# Patient Record
Sex: Female | Born: 1955
Health system: Southern US, Community
[De-identification: ages and names within clinical notes are randomized; demographics above are authoritative.]

## PROBLEM LIST (undated history)

## (undated) DIAGNOSIS — F32A Depression, unspecified: Secondary | ICD-10-CM

## (undated) DIAGNOSIS — F329 Major depressive disorder, single episode, unspecified: Secondary | ICD-10-CM

## (undated) DIAGNOSIS — F419 Anxiety disorder, unspecified: Secondary | ICD-10-CM

## (undated) DIAGNOSIS — I10 Essential (primary) hypertension: Principal | ICD-10-CM

## (undated) DIAGNOSIS — K759 Inflammatory liver disease, unspecified: Secondary | ICD-10-CM

## (undated) DIAGNOSIS — G500 Trigeminal neuralgia: Secondary | ICD-10-CM

## (undated) DIAGNOSIS — G0490 Encephalitis and encephalomyelitis, unspecified: Secondary | ICD-10-CM

## (undated) DIAGNOSIS — G43909 Migraine, unspecified, not intractable, without status migrainosus: Secondary | ICD-10-CM

## (undated) HISTORY — PX: CRANIOTOMY: SHX93

## (undated) HISTORY — DX: Essential (primary) hypertension: I10

## (undated) HISTORY — DX: Trigeminal neuralgia: G50.0

## (undated) HISTORY — PX: BREAST SURGERY: SHX581

## (undated) HISTORY — PX: OTHER SURGICAL HISTORY: SHX169

---

## 1998-08-27 ENCOUNTER — Ambulatory Visit (HOSPITAL_COMMUNITY): Admission: RE | Admit: 1998-08-27 | Discharge: 1998-08-27 | Payer: Self-pay | Admitting: Obstetrics and Gynecology

## 2000-06-19 ENCOUNTER — Emergency Department (HOSPITAL_COMMUNITY): Admission: EM | Admit: 2000-06-19 | Discharge: 2000-06-19 | Payer: Self-pay | Admitting: *Deleted

## 2002-02-22 ENCOUNTER — Encounter: Admission: RE | Admit: 2002-02-22 | Discharge: 2002-03-31 | Payer: Self-pay | Admitting: Occupational Medicine

## 2002-03-18 ENCOUNTER — Inpatient Hospital Stay (HOSPITAL_COMMUNITY): Admission: EM | Admit: 2002-03-18 | Discharge: 2002-03-19 | Payer: Self-pay | Admitting: Emergency Medicine

## 2002-11-19 ENCOUNTER — Ambulatory Visit (HOSPITAL_COMMUNITY): Admission: RE | Admit: 2002-11-19 | Discharge: 2002-11-19 | Payer: Self-pay | Admitting: Neurology

## 2002-11-19 ENCOUNTER — Encounter: Payer: Self-pay | Admitting: Neurology

## 2002-11-29 ENCOUNTER — Other Ambulatory Visit: Admission: RE | Admit: 2002-11-29 | Discharge: 2002-11-29 | Payer: Self-pay | Admitting: Obstetrics and Gynecology

## 2002-12-12 ENCOUNTER — Encounter: Payer: Self-pay | Admitting: Obstetrics and Gynecology

## 2002-12-12 ENCOUNTER — Encounter: Admission: RE | Admit: 2002-12-12 | Discharge: 2002-12-12 | Payer: Self-pay | Admitting: Obstetrics and Gynecology

## 2004-01-29 ENCOUNTER — Other Ambulatory Visit: Admission: RE | Admit: 2004-01-29 | Discharge: 2004-01-29 | Payer: Self-pay | Admitting: Obstetrics and Gynecology

## 2005-04-06 ENCOUNTER — Emergency Department (HOSPITAL_COMMUNITY): Admission: EM | Admit: 2005-04-06 | Discharge: 2005-04-06 | Payer: Self-pay | Admitting: Emergency Medicine

## 2005-09-24 ENCOUNTER — Other Ambulatory Visit: Admission: RE | Admit: 2005-09-24 | Discharge: 2005-09-24 | Payer: Self-pay | Admitting: Obstetrics and Gynecology

## 2006-06-03 ENCOUNTER — Encounter: Admission: RE | Admit: 2006-06-03 | Discharge: 2006-06-03 | Payer: Self-pay | Admitting: Neurology

## 2010-11-30 ENCOUNTER — Encounter: Payer: Self-pay | Admitting: Neurology

## 2011-11-10 HISTORY — PX: BRAIN SURGERY: SHX531

## 2011-11-12 DIAGNOSIS — F419 Anxiety disorder, unspecified: Secondary | ICD-10-CM | POA: Diagnosis present

## 2011-11-12 DIAGNOSIS — F32A Depression, unspecified: Secondary | ICD-10-CM | POA: Diagnosis present

## 2012-12-29 ENCOUNTER — Other Ambulatory Visit: Payer: Self-pay | Admitting: Obstetrics and Gynecology

## 2012-12-29 DIAGNOSIS — Z1231 Encounter for screening mammogram for malignant neoplasm of breast: Secondary | ICD-10-CM

## 2013-01-10 ENCOUNTER — Ambulatory Visit: Payer: Self-pay

## 2013-02-16 ENCOUNTER — Ambulatory Visit
Admission: RE | Admit: 2013-02-16 | Discharge: 2013-02-16 | Disposition: A | Discharge status: Home / Self Care | Payer: Self-pay | Source: Ambulatory Visit | Attending: Obstetrics and Gynecology | Admitting: Obstetrics and Gynecology

## 2013-02-16 DIAGNOSIS — Z1231 Encounter for screening mammogram for malignant neoplasm of breast: Secondary | ICD-10-CM

## 2013-06-15 ENCOUNTER — Other Ambulatory Visit: Payer: Self-pay | Admitting: Gastroenterology

## 2013-07-13 ENCOUNTER — Encounter (HOSPITAL_COMMUNITY): Payer: Self-pay | Admitting: Pharmacy Technician

## 2013-07-31 ENCOUNTER — Encounter (HOSPITAL_COMMUNITY): Payer: Self-pay | Admitting: *Deleted

## 2013-08-01 ENCOUNTER — Encounter (HOSPITAL_COMMUNITY): Payer: Self-pay | Admitting: Anesthesiology

## 2013-08-01 ENCOUNTER — Ambulatory Visit (HOSPITAL_COMMUNITY): Payer: 59 | Admitting: Anesthesiology

## 2013-08-01 ENCOUNTER — Ambulatory Visit (HOSPITAL_COMMUNITY)
Admission: RE | Admit: 2013-08-01 | Discharge: 2013-08-01 | Disposition: A | Discharge status: Home / Self Care | Payer: 59 | Source: Ambulatory Visit | Attending: Gastroenterology | Admitting: Gastroenterology

## 2013-08-01 ENCOUNTER — Encounter (HOSPITAL_COMMUNITY)
Admission: RE | Disposition: A | Discharge status: Home / Self Care | Payer: Self-pay | Source: Ambulatory Visit | Attending: Gastroenterology

## 2013-08-01 DIAGNOSIS — F411 Generalized anxiety disorder: Secondary | ICD-10-CM | POA: Insufficient documentation

## 2013-08-01 DIAGNOSIS — D126 Benign neoplasm of colon, unspecified: Secondary | ICD-10-CM | POA: Insufficient documentation

## 2013-08-01 DIAGNOSIS — G4700 Insomnia, unspecified: Secondary | ICD-10-CM | POA: Insufficient documentation

## 2013-08-01 DIAGNOSIS — I10 Essential (primary) hypertension: Secondary | ICD-10-CM | POA: Insufficient documentation

## 2013-08-01 DIAGNOSIS — Z1211 Encounter for screening for malignant neoplasm of colon: Secondary | ICD-10-CM | POA: Insufficient documentation

## 2013-08-01 HISTORY — PX: COLONOSCOPY WITH PROPOFOL: SHX5780

## 2013-08-01 HISTORY — DX: Major depressive disorder, single episode, unspecified: F32.9

## 2013-08-01 HISTORY — DX: Depression, unspecified: F32.A

## 2013-08-01 HISTORY — DX: Inflammatory liver disease, unspecified: K75.9

## 2013-08-01 HISTORY — DX: Anxiety disorder, unspecified: F41.9

## 2013-08-01 HISTORY — DX: Essential (primary) hypertension: I10

## 2013-08-01 SURGERY — COLONOSCOPY WITH PROPOFOL
Anesthesia: Monitor Anesthesia Care

## 2013-08-01 MED ORDER — PROPOFOL 10 MG/ML IV BOLUS
INTRAVENOUS | Status: DC | PRN
  Administered 2013-08-01 (×3): 50 mg via INTRAVENOUS
  Administered 2013-08-01 (×2): 25 mg via INTRAVENOUS

## 2013-08-01 MED ORDER — SODIUM CHLORIDE 0.9 % IV SOLN: INTRAVENOUS | Status: DC

## 2013-08-01 MED ORDER — LACTATED RINGERS IV SOLN
INTRAVENOUS | Status: DC | PRN
  Administered 2013-08-01: 10:00:00 via INTRAVENOUS

## 2013-08-01 MED ORDER — LACTATED RINGERS IV SOLN
INTRAVENOUS | Status: DC
  Administered 2013-08-01: 1000 mL/h via INTRAVENOUS

## 2013-08-01 MED ORDER — KETAMINE HCL 50 MG/ML IJ SOLN
INTRAMUSCULAR | Status: DC | PRN
  Administered 2013-08-01: 25 mg via INTRAMUSCULAR

## 2013-08-01 SURGICAL SUPPLY — 22 items

## 2013-08-01 NOTE — H&P (Signed)
  Procedure: Baseline screening colonoscopy  History: The patient is a 57 year old female born 11/09/1956. The patient is scheduled to undergo her first screening colonoscopy with polypectomy to prevent colon cancer.  Medication allergies: ACE inhibitors cause cough  Past medical history: Hypertension. Trigeminal neuralgia. Anxiety. Insomnia. Gamma knife surgery in September 2007. Cardioversion for tachycardia. Decompression of trigeminal nerve in 2013.  Exam: The patient is alert and lying comfortably on the endoscopy stretcher. Abdomen is soft nontender to palpation. Lungs are clear to auscultation. Cardiac exam reveals a regular rhythm.  Plan: Proceed with a saline screening colonoscopy.

## 2013-08-01 NOTE — Op Note (Signed)
Procedure: Baseline screening colonoscopy  Endoscopist: Danise Edge  Premedication: Propofol administered by anesthesia  Procedure: The patient was placed in the left lateral decubitus position. Anal inspection and digital rectal exam were normal. The Pentax pediatric colonoscope was introduced into the rectum and advanced to the cecum. A normal-appearing ileocecal valve and appendiceal orifice were identified. Colonic preparation for the exam today was good.  Rectum. Normal. Retroflexed view of the distal rectum normal.  Sigmoid colon. From the distal sigmoid colon a 5 mm sessile polyp was removed with the cold snare and submitted for pathological interpretation.  Descending colon. Normal.  Splenic flexure. Normal.  Transverse colon. Normal.  Hepatic flexure. Normal.  Ascending colon. Normal.  Cecum and ileocecal valve. Normal.  Assessment:  #1. From the distal sigmoid colon a 5 mm sessile polyp was removed with the cold snare  #2. Otherwise normal screening proctocolonoscopy to the cecum  Recommendations: If the sigmoid colon polyp returns adenomatous pathologically, the patient should undergo a surveillance colonoscopy in 5 years. If the sigmoid colon polyp returns non-neoplastic, the patient should undergo a repeat screening colonoscopy in 10 years.

## 2013-08-01 NOTE — Anesthesia Postprocedure Evaluation (Signed)
  Anesthesia Post-op Note  Patient: Rhonda [name]  Procedure(s) Performed: Procedure(s) (LRB): COLONOSCOPY WITH PROPOFOL (N/A)  Patient Location: PACU  Anesthesia Type: MAC  Level of Consciousness: awake and alert   Airway and Oxygen Therapy: Patient Spontanous Breathing  Post-op Pain: mild  Post-op Assessment: Post-op Vital signs reviewed, Patient's Cardiovascular Status Stable, Respiratory Function Stable, Patent Airway and No signs of Nausea or vomiting  Last Vitals:  Filed Vitals:   08/01/13 1110  BP: 150/97  Pulse:   Temp:   Resp: 11    Post-op Vital Signs: stable   Complications: No apparent anesthesia complications

## 2013-08-01 NOTE — Anesthesia Preprocedure Evaluation (Addendum)
Anesthesia Evaluation  Patient identified by MRN, date of birth, ID band Patient awake    Reviewed: Allergy & Precautions, H&P , NPO status , Patient's Chart, lab work & pertinent test results, reviewed documented beta blocker date and time   Airway Mallampati: II TM Distance: >3 FB Neck ROM: full    Dental no notable dental hx. (+) Teeth Intact and Dental Advisory Given   Pulmonary neg pulmonary ROS,  breath sounds clear to auscultation  Pulmonary exam normal       Cardiovascular Exercise Tolerance: Good hypertension, Pt. on medications and Pt. on home beta blockers negative cardio ROS  Rhythm:regular Rate:Normal     Neuro/Psych negative neurological ROS  negative psych ROS   GI/Hepatic negative GI ROS, Neg liver ROS, (+) Hepatitis -, BAge 15 hep B   Endo/Other  negative endocrine ROS  Renal/GU negative Renal ROS  negative genitourinary   Musculoskeletal   Abdominal   Peds  Hematology negative hematology ROS (+)   Anesthesia Other Findings   Reproductive/Obstetrics negative OB ROS                          Anesthesia Physical Anesthesia Plan  ASA: II  Anesthesia Plan: MAC   Post-op Pain Management:    Induction:   Airway Management Planned: Simple Face Mask  Additional Equipment:   Intra-op Plan:   Post-operative Plan:   Informed Consent: I have reviewed the patients History and Physical, chart, labs and discussed the procedure including the risks, benefits and alternatives for the proposed anesthesia with the patient or authorized representative who has indicated his/her understanding and acceptance.   Dental Advisory Given  Plan Discussed with: CRNA and Surgeon  Anesthesia Plan Comments:         Anesthesia Quick Evaluation

## 2013-08-01 NOTE — Transfer of Care (Signed)
Immediate Anesthesia Transfer of Care Note  Patient: Rhonda [name]  Procedure(s) Performed: Procedure(s): COLONOSCOPY WITH PROPOFOL (N/A)  Patient Location: PACU  Anesthesia Type:MAC  Level of Consciousness: awake, alert , sedated and patient cooperative  Airway & Oxygen Therapy: Patient Spontanous Breathing and Patient connected to face mask oxygen  Post-op Assessment: Report given to PACU RN and Post -op Vital signs reviewed and stable  Post vital signs: Reviewed and stable  Complications: No apparent anesthesia complications

## 2013-08-02 ENCOUNTER — Encounter (HOSPITAL_COMMUNITY): Payer: Self-pay | Admitting: Gastroenterology

## 2013-08-11 ENCOUNTER — Encounter: Payer: Self-pay | Admitting: Family Medicine

## 2013-08-11 ENCOUNTER — Ambulatory Visit (INDEPENDENT_AMBULATORY_CARE_PROVIDER_SITE_OTHER): Payer: 59 | Admitting: Family Medicine

## 2013-08-11 VITALS — BP 150/92 | HR 70

## 2013-08-11 DIAGNOSIS — M7710 Lateral epicondylitis, unspecified elbow: Secondary | ICD-10-CM

## 2013-08-11 DIAGNOSIS — M7711 Lateral epicondylitis, right elbow: Secondary | ICD-10-CM | POA: Insufficient documentation

## 2013-08-11 MED ORDER — MELOXICAM 15 MG PO TABS: 15.0000 mg | ORAL_TABLET | Freq: Every day | ORAL | Status: DC

## 2013-08-11 MED ORDER — NITROGLYCERIN 0.2 MG/HR TD PT24: MEDICATED_PATCH | TRANSDERMAL | Status: DC

## 2013-08-11 NOTE — Assessment & Plan Note (Addendum)
Discussed with patient at great length about prognosis, and rehabilitation.  Patient given prescription for meloxicam as well as nitroglycerin protocol which the chronicity of this problem. Patient showed that she did have non-insertional chronic tendinopathy and I do think will respond well to nitroglycerin. Discussed icing protocol Home exercise program given. Patient does have a home TENS unit and can use this as tolerated. Patient given a wrist brace to wear at night and elbow compression brace to wear during the day. Patient will come back and see me again in 3-4 weeks for further evaluation.

## 2013-08-11 NOTE — Progress Notes (Signed)
CC: Elbow pain, right  HPI: Patient is a very pleasant 57 year old female who works as a Engineer, civil (consulting) within the [employer] coming in with elbow pain. Patient states that she has had this pain for approximately 2 years intermittently. Patient does not remember any specific injury. Patient describes the pain as a dull chronic sensation. Patient states that certain activity seems to make it worse. Patient states they can have sharp pain with certain range of motion exercises. Patient states throwing a Frisbee or sometimes at work when moving the patient she can have pain. Patient denies any radiation, any numbness. Patient has been seen an orthopedic surgeon for this over the course of the last 2 years and has had multiple corticosteroid injections in it. Patient has not been formal physical therapy and did not respond well to the counter brace. Patient continues to try a topical anti-inflammatories as well as icing with minimal improvement. Patient also takes over-the-counter anti-inflammatories with minimal improvement. Patient was the severity of pain approximately 3-4/10 but states that the affecting of her daily life seems to be 7/10.  Past medical, surgical, family and social history reviewed. Medications reviewed all in the electronic medical record.   Review of Systems: No headache, visual changes, nausea, vomiting, diarrhea, constipation, dizziness, abdominal pain, skin rash, fevers, chills, night sweats, weight loss, swollen lymph nodes, body aches, joint swelling, muscle aches, chest pain, shortness of breath, mood changes.   Objective:    Blood pressure 150/92, pulse 70, weight 0 lb (0 kg), SpO2 97.00%.   General: No apparent distress alert and oriented x3 mood and affect normal, dressed appropriately.  HEENT: Pupils equal, extraocular movements intact Respiratory: Patient's speak in full sentences and does not appear short of breath Cardiovascular: No lower extremity edema, non  tender, no erythema Skin: Warm dry intact with no signs of infection or rash on extremities or on axial skeleton. Abdomen: Soft nontender Neuro: Cranial nerves II through XII are intact, neurovascularly intact in all extremities with 2+ DTRs and 2+ pulses. Lymph: No lymphadenopathy of posterior or anterior cervical chain or axillae bilaterally.  Gait normal with good balance and coordination.  MSK: Non tender with full range of motion and good stability and symmetric strength and tone of shoulders, wrist, hip, knee and ankles bilaterally.  Elbow: Right Unremarkable to inspection. Range of motion full pronation, supination, flexion, extension. Strength is full to all of the above directions.  Patient though does have significant pain against resisted extension of the middle finger at the lateral epicondylar region. Stable to varus, valgus stress. Negative moving valgus stress test.. Ulnar nerve does not sublux. Negative cubital tunnel Tinel's. Left elbow is unremarkable.  Musculoskeletal ultrasound was performed and interpreted by Terrilee Files D.O.   Elbow:  Lateral epicondyle and common extensor tendon origin visualized.  Trace effusion noted. Patient has what appears to be chronic tendinopathy with calcific changes within the tendon itself likely representing a remote tear.  Radial head unremarkable and located in annular ligament Medial epicondyle and common flexor tendon origin visualized.  No edema, effusions, or avulsions seen. Ulnar nerve in cubital tunnel unremarkable. Olecranon and triceps insertion visualized and unremarkable without edema, effusion, or avulsion.  No signs olecranon bursitis. Power doppler signal normal.  IMPRESSION:  Chronic non-insertional tendinopathy of a lateral epicondylitis   Impression and Recommendations:     This case required medical decision making of moderate complexity.

## 2013-08-11 NOTE — Patient Instructions (Signed)
Always good to see you You do have chronic tenopathy of the lateral epicondylitis.  meloxicam daily for 10 days then as needed.  Nitroglycerin Protocol   Apply 1/4 nitroglycerin patch to affected area daily.  Change position of patch within the affected area every 24 hours.  You may experience a headache during the first 1-2 weeks of using the patch, these should subside.  If you experience headaches after beginning nitroglycerin patch treatment, you may take your preferred over the counter pain reliever.  Another side effect of the nitroglycerin patch is skin irritation or rash related to patch adhesive.  Please notify our office if you develop more severe headaches or rash, and stop the patch.  Tendon healing with nitroglycerin patch may require 12 to 24 weeks depending on the extent of injury.  Men should not use if taking Viagra, Cialis, or Levitra.   Do not use if you have migraines or rosacea.   Icing 20 minutes 2 times daily Wear compression with work and brace at night.  Come back in 3-4 weeks.

## 2013-08-11 NOTE — Addendum Note (Signed)
Addended by: Judi Saa on: 08/11/2013 12:08 PM   Modules accepted: Orders

## 2013-09-07 ENCOUNTER — Ambulatory Visit: Payer: 59 | Admitting: Family Medicine

## 2013-09-28 ENCOUNTER — Ambulatory Visit (INDEPENDENT_AMBULATORY_CARE_PROVIDER_SITE_OTHER): Payer: 59 | Admitting: Cardiology

## 2013-09-28 ENCOUNTER — Encounter: Payer: Self-pay | Admitting: Cardiology

## 2013-09-28 VITALS — BP 160/90 | HR 66 | Ht 62.5 in | Wt 200.0 lb

## 2013-09-28 DIAGNOSIS — I10 Essential (primary) hypertension: Secondary | ICD-10-CM | POA: Insufficient documentation

## 2013-09-28 DIAGNOSIS — R7309 Other abnormal glucose: Secondary | ICD-10-CM

## 2013-09-28 DIAGNOSIS — R0789 Other chest pain: Secondary | ICD-10-CM

## 2013-09-28 DIAGNOSIS — R739 Hyperglycemia, unspecified: Secondary | ICD-10-CM

## 2013-09-28 HISTORY — DX: Essential (primary) hypertension: I10

## 2013-09-28 MED ORDER — HYDROCHLOROTHIAZIDE 25 MG PO TABS: 25.0000 mg | ORAL_TABLET | Freq: Every day | ORAL | Status: AC

## 2013-09-28 NOTE — Progress Notes (Signed)
Rhonda [name] Date of Birth: 04/22/1956 Medical Record #[id number]  History of Present Illness: Rhonda [name] is seen today for evaluation of hypertension. I've seen her in the past for evaluation of the same. She reports that her blood pressure has not been under ideal control. She has been taking losartan and metoprolol. She is intolerant to lisinopril because of cough. She developed swelling on amlodipine. She does have some increased stressors at home. She also is being treated for trigeminal neuralgia. She was previously on HCTZ but this was discontinued by herself. There is no history of intolerance to this. She does report that when her blood pressure is elevated she notices throbbing in her head. She has been experiencing symptoms of intermittent chest tightness. It is interesting to note that she was placed on a nitroglycerin patch for tennis elbow and after the nitroglycerin patch was placed her chest tightness resolved. She reports that she thinks is most of her meals at home and does not add salt. She doesn't eat out a lot or eat any fast food. She does not exercise regularly. She has been battling her weight.  Current Outpatient Prescriptions on File Prior to Visit  Medication Sig Dispense Refill  . ALPRAZolam (XANAX) 0.25 MG tablet Take 0.25 mg by mouth at bedtime as needed for sleep.      Marland Kitchen aspirin EC 81 MG tablet Take 81 mg by mouth daily.      . baclofen (LIORESAL) 10 MG tablet Take 10 mg by mouth 3 (three) times daily.      . fish oil-omega-3 fatty acids 1000 MG capsule Take 2 g by mouth daily.      Marland Kitchen gabapentin (NEURONTIN) 100 MG capsule Take 100 mg by mouth 2 (two) times daily as needed (pain).      . Glucosamine 500 MG CAPS Take 1 capsule by mouth daily.      . meloxicam (MOBIC) 15 MG tablet Take 1 tablet (15 mg total) by mouth daily.  30 tablet  0  . metoprolol succinate (TOPROL-XL) 100 MG 24 hr tablet Take 100 mg by mouth every evening. Take with or immediately following a meal.        . Multiple Vitamin (MULTIVITAMIN WITH MINERALS) TABS tablet Take 1 tablet by mouth daily.      . nitroGLYCERIN (NITRODUR - DOSED IN MG/24 HR) 0.2 mg/hr patch 1/4 patch daily  30 patch  1  . venlafaxine XR (EFFEXOR-XR) 75 MG 24 hr capsule Take 75 mg by mouth every morning.      . vitamin E 400 UNIT capsule Take 400 Units by mouth daily.       No current facility-administered medications on file prior to visit.    Allergies  Allergen Reactions  . Amlodipine Swelling  . Lisinopril Cough    Past Medical History  Diagnosis Date  . Hypertension   . Anxiety   . Depression   . Hepatitis     ? B when age 25  . Trigeminal neuralgia   . HTN (hypertension) 09/28/2013    Past Surgical History  Procedure Laterality Date  . Breast surgery      age 27-benign,  . Brain surgery  2013    trigeminal neuralgia  . Colonoscopy with propofol N/A 08/01/2013    Procedure: COLONOSCOPY WITH PROPOFOL;  Surgeon: Charolett Bumpers, MD;  Location: [facility];  Service: Endoscopy;  Laterality: N/A;  . Craniotomy      History  Smoking status  . Never Smoker  Smokeless tobacco  . Not on file    History  Alcohol Use  . Yes    Comment: occassionally wine    Family History  Problem Relation Age of Onset  . Arthritis Mother   . Hyperlipidemia Mother   . Heart disease Mother   . Stroke Mother   . Hypertension Mother   . Diabetes Mother   . Arthritis Father   . Hyperlipidemia Father   . Heart disease Father   . Stroke Father   . Hypertension Father   . Diabetes Father   . Cancer Maternal Grandmother   . Kidney disease Maternal Grandmother   . Mental illness Maternal Grandmother   . Hypertension Maternal Grandmother   . Heart disease Maternal Grandmother   . Cancer Maternal Grandfather   . Kidney disease Maternal Grandfather   . Mental illness Maternal Grandfather   . Hypertension Maternal Grandfather   . Heart disease Maternal Grandfather   . Cancer Paternal Grandmother   . Kidney  disease Paternal Grandmother   . Mental illness Paternal Grandmother   . Hypertension Paternal Grandmother   . Heart disease Paternal Grandmother   . Cancer Paternal Grandfather   . Kidney disease Paternal Grandfather   . Mental illness Paternal Grandfather   . Hypertension Paternal Grandfather   . Heart disease Paternal Grandfather     Review of Systems: The review of systems is positive for trigeminal neuralgia.  She underwent a craniotomy procedure for this. All other systems were reviewed and are negative.  Physical Exam: BP 160/90  Pulse 66  Ht 5' 2.5" (1.588 m)  Wt 200 lb (90.719 kg)  BMI 35.97 kg/m2 She is a pleasant, overweight white female in no acute distress. HEENT: Normocephalic, atraumatic. Pupils equal round and reactive. Sclera clear. Oropharynx is clear. Neck: No JVD or bruits. No adenopathy or thyromegaly. Lungs: Clear Cardiovascular: Regular rate and rhythm, normal S1 and S2, no gallop or murmur. Abdomen: Obese, soft, nontender. No masses or bruits. No hepatosplenomegaly. Bowel sounds are positive. Extremities: No cyanosis or edema. Pedal pulses are 2+. Skin: Warm and dry Neuro: Alert and oriented x3. Cranial nerves II through XII are intact.    LABORATORY DATA: Records obtained from her primary care office demonstrated a normal urinalysis. In April 2014 dose was 135, BUN 16, creatinine 0.81. Electrolytes were normal. TSH was normal 1.82.  ECG today demonstrates normal sinus rhythm. There is T wave abnormality consistent with inferior lateral ischemia.  Assessment / Plan: 1. Chest tightness concerning for angina. Symptoms improved with the nitroglycerin patch. ECG shows evidence of inferior lateral ischemia. Patient has never had ischemic evaluation. We will schedule her for a stress Myoview study.  2. Hypertension. Blood pressures have been consistently mildly elevated. This may be exacerbated by social stressors and her trigeminal neuralgia. I think her  current therapy is acceptable but would recommend adding HCTZ 25 mg daily. We discussed the importance of sodium restriction and a heart healthy diet. With her elevated blood sugar she needs to be particularly cautious with simple carbohydrates. I recommended aerobic exercise 30-40 minutes a day. I will followup in 2 months and we will check fasting lab work at that time including chemistries, lipids, and A1c.  3. Intolerance to amlodipine and ACE inhibitors.  4. Trigeminal neuralgia.

## 2013-09-28 NOTE — Patient Instructions (Signed)
Restrict your sodium intake and try and lose weight  Start HCTZ 25 mg daily.  Continue your other medication  Try and get 30-45 minutes of aerobic exercise daily.  We will schedule you for a nuclear stress test.  I will see you in 2 months with fasting labs.

## 2013-11-07 ENCOUNTER — Ambulatory Visit: Payer: 59 | Admitting: Family Medicine

## 2013-11-20 ENCOUNTER — Encounter: Payer: Self-pay | Admitting: Cardiology

## 2013-11-20 ENCOUNTER — Encounter (INDEPENDENT_AMBULATORY_CARE_PROVIDER_SITE_OTHER): Payer: Self-pay

## 2013-11-20 ENCOUNTER — Ambulatory Visit (HOSPITAL_COMMUNITY): Payer: 59 | Attending: Cardiology | Admitting: Radiology

## 2013-11-20 VITALS — BP 157/97 | HR 71 | Ht 62.5 in | Wt 201.0 lb

## 2013-11-20 DIAGNOSIS — R0609 Other forms of dyspnea: Secondary | ICD-10-CM | POA: Insufficient documentation

## 2013-11-20 DIAGNOSIS — Z8249 Family history of ischemic heart disease and other diseases of the circulatory system: Secondary | ICD-10-CM | POA: Insufficient documentation

## 2013-11-20 DIAGNOSIS — R0789 Other chest pain: Secondary | ICD-10-CM | POA: Insufficient documentation

## 2013-11-20 DIAGNOSIS — I10 Essential (primary) hypertension: Secondary | ICD-10-CM | POA: Insufficient documentation

## 2013-11-20 DIAGNOSIS — R0602 Shortness of breath: Secondary | ICD-10-CM

## 2013-11-20 DIAGNOSIS — R002 Palpitations: Secondary | ICD-10-CM | POA: Insufficient documentation

## 2013-11-20 DIAGNOSIS — R0989 Other specified symptoms and signs involving the circulatory and respiratory systems: Secondary | ICD-10-CM | POA: Insufficient documentation

## 2013-11-20 DIAGNOSIS — R739 Hyperglycemia, unspecified: Secondary | ICD-10-CM

## 2013-11-20 DIAGNOSIS — R079 Chest pain, unspecified: Secondary | ICD-10-CM

## 2013-11-20 DIAGNOSIS — R9431 Abnormal electrocardiogram [ECG] [EKG]: Secondary | ICD-10-CM | POA: Insufficient documentation

## 2013-11-20 MED ORDER — TECHNETIUM TC 99M SESTAMIBI GENERIC - CARDIOLITE
30.0000 | Freq: Once | INTRAVENOUS | Status: AC | PRN
  Administered 2013-11-20: 30 via INTRAVENOUS

## 2013-11-20 MED ORDER — TECHNETIUM TC 99M SESTAMIBI GENERIC - CARDIOLITE
10.0000 | Freq: Once | INTRAVENOUS | Status: AC | PRN
  Administered 2013-11-20: 10 via INTRAVENOUS

## 2013-11-20 NOTE — Progress Notes (Signed)
[facility] [address] [place], [place] [address] [phone]    Cardiology Nuclear Med Study  Rhonda [name] is a 58 y.o. female     MRN : [id number]     DOB: [birth year]  Procedure Date: 11/20/2013  Nuclear Med Background Indication for Stress Test:  Evaluation for Ischemia and Abnormal EKG History:  No Known  prior hx of CAD; hx SVT; '03 Echo: EF 65-70% Cardiac Risk Factors: Family History - CAD and Hypertension  Symptoms: Chest Tightness without exertion (last occurrence couple months ago), DOE, Palpitations and SOB   Nuclear Pre-Procedure Caffeine/Decaff Intake:  None NPO After: 2:00am   Lungs:  clear O2 Sat: 98% on room air. IV 0.9% NS with Angio Cath:  22g  IV Site: R Hand  IV Started by:  Matilde Haymaker, RN  Chest Size (in):  42 Cup Size: DDD  Height: 5' 2.5" (1.588 m)  Weight:  201 lb (91.173 kg)  BMI:  Body mass index is 36.15 kg/(m^2). Tech Comments:  Held toprol for [facility] Med Study 1 or 2 day study: 1 day  Stress Test Type:  Stress  Reading MD: N/A  Order Authorizing Provider:  Peter Martinique, MD  Resting Radionuclide: Technetium 27m Sestamibi  Resting Radionuclide Dose: 11.0 mCi   Stress Radionuclide:  Technetium 32m Sestamibi  Stress Radionuclide Dose: 33.0 mCi           Stress Protocol Rest HR: 71 Stress HR: 155  Rest BP: 157/97 Stress BP: 222/80  Exercise Time (min): 5:00 METS: 7.0   Predicted Max HR: 161 bpm % Max HR: 96.27 bpm Rate Pressure Product: 34410   Dose of Adenosine (mg):  n/a Dose of Lexiscan: n/a mg  Dose of Atropine (mg): n/a Dose of Dobutamine: n/a mcg/kg/min (at max HR)  Stress Test Technologist: Irven Baltimore, RN  Nuclear Technologist:  Charlton Amor, CNMT     Rest Procedure:  Myocardial perfusion imaging was performed at rest 45 minutes following the intravenous administration of Technetium 38m Sestamibi. Rest ECG: NSR with non-specific ST-T wave changes  Stress Procedure:  The  patient exercised on the treadmill utilizing the Bruce Protocol for 5:00 minutes, RPE=15. The patient stopped due to DOE and denied any chest pain. There was a hypertensive response to exercise. Technetium 41m Sestamibi was injected at peak exercise and myocardial perfusion imaging was performed after a brief delay. Stress ECG: With stress, nonspecific STT wave changes increase.  QPS Raw Data Images:  Normal; no motion artifact; normal heart/lung ratio. Stress Images:  Normal homogeneous uptake in all areas of the myocardium. Rest Images:  Normal homogeneous uptake in all areas of the myocardium. Subtraction (SDS):  No evidence of ischemia. Transient Ischemic Dilatation (Normal <1.22):  0.96 Lung/Heart Ratio (Normal <0.45):  0.35  Quantitative Gated Spect Images QGS EDV:  54 ml QGS ESV:  15 ml  Impression Exercise Capacity:  Good exercise capacity. BP Response:  Hypertensive blood pressure response. Clinical Symptoms:  No chest pain. ECG Impression:  No significant ST segment change suggestive of ischemia. Comparison with Prior Nuclear Study: No previous nuclear study performed  Overall Impression:  Low risk stress nuclear study.  No evidence of ischemia or scar. Hypertensive response to exercise..  LV Ejection Fraction: 73%.  LV Wall Motion:  NL LV Function; NL Wall Motion  [employer]

## 2013-11-22 ENCOUNTER — Encounter: Payer: Self-pay | Admitting: Family Medicine

## 2013-11-22 ENCOUNTER — Other Ambulatory Visit (INDEPENDENT_AMBULATORY_CARE_PROVIDER_SITE_OTHER): Payer: 59

## 2013-11-22 ENCOUNTER — Ambulatory Visit (INDEPENDENT_AMBULATORY_CARE_PROVIDER_SITE_OTHER): Payer: 59 | Admitting: Family Medicine

## 2013-11-22 VITALS — BP 142/78 | HR 67 | Temp 97.9°F | Resp 16

## 2013-11-22 DIAGNOSIS — M7711 Lateral epicondylitis, right elbow: Secondary | ICD-10-CM

## 2013-11-22 DIAGNOSIS — M7710 Lateral epicondylitis, unspecified elbow: Secondary | ICD-10-CM

## 2013-11-22 DIAGNOSIS — S548X9A Unspecified injury of other nerves at forearm level, unspecified arm, initial encounter: Secondary | ICD-10-CM | POA: Insufficient documentation

## 2013-11-22 DIAGNOSIS — S5420XA Injury of radial nerve at forearm level, unspecified arm, initial encounter: Secondary | ICD-10-CM

## 2013-11-22 NOTE — Progress Notes (Signed)
CC: Elbow pain, right  HPI: Patient is a very pleasant 58 year old female who works as a Marine scientist within the [employer] coming in for followup of left lateral epicondylitis. Patient was seen longer than 3 months ago. Patient did have a granddaughter born and she has been out of town for quite some time. Patient states she has not been as aggressive with the exercises but continues to have a nitroglycerin patch as well as the counter brace and wrist splint at night. Patient states she has not noticed any significant improvement.  Past medical, surgical, family and social history reviewed. Medications reviewed all in the electronic medical record.   Review of Systems: No headache, visual changes, nausea, vomiting, diarrhea, constipation, dizziness, abdominal pain, skin rash, fevers, chills, night sweats, weight loss, swollen lymph nodes, body aches, joint swelling, muscle aches, chest pain, shortness of breath, mood changes.   Objective:    Blood pressure 142/78, pulse 67, temperature 97.9 F (36.6 C), temperature source Oral, resp. rate 16, SpO2 98.00%.   General: No apparent distress alert and oriented x3 mood and affect normal, dressed appropriately.  HEENT: Pupils equal, extraocular movements intact Respiratory: Patient's speak in full sentences and does not appear short of breath Cardiovascular: No lower extremity edema, non tender, no erythema Skin: Warm dry intact with no signs of infection or rash on extremities or on axial skeleton. Abdomen: Soft nontender Neuro: Cranial nerves II through XII are intact, neurovascularly intact in all extremities with 2+ DTRs and 2+ pulses. Lymph: No lymphadenopathy of posterior or anterior cervical chain or axillae bilaterally.  Gait normal with good balance and coordination.  MSK: Non tender with full range of motion and good stability and symmetric strength and tone of shoulders, wrist, hip, knee and ankles bilaterally.  Elbow:  Right Unremarkable to inspection. Range of motion full pronation, supination, flexion, extension. Strength is full to all of the above directions.  Patient though does have significant pain against resisted extension of the middle finger at the lateral epicondylar region. Patient does have some mild weakness of extension of the wrist compared to last visit. Stable to varus, valgus stress. Negative moving valgus stress test.. Ulnar nerve does not sublux. Negative cubital tunnel Tinel's. Left elbow is unremarkable.  Musculoskeletal ultrasound was performed and interpreted by Charlann Boxer D.O.   Elbow:  Lateral epicondyle and common extensor tendon origin visualized.  Fusion is extremely better than last visit the patient still has. As well as resolved calcific changes. Radial head unremarkable and located in annular ligament Medial epicondyle and common flexor tendon origin visualized.  No edema, effusions, or avulsions seen. Ulnar nerve in cubital tunnel unremarkable. Olecranon and triceps insertion visualized and unremarkable without edema, effusion, or avulsion.  No signs olecranon bursitis. Patient though does have significant swelling around the posterior interosseous nerve with some scarring. Power doppler signal normal.  IMPRESSION:  Chronic non-insertional tendinopathy of a lateral epicondylitis improving but, posterior interosseous nerve irritation  After verbal consent patient was prepped with alcohol swabs and with a 27-gauge 1-1/2 inch needle was injected with 3 cc of 0.5% Marcaine and 1 cc of Kenalog 40 mg/dL into the posterior interosseous nerve sheath. Patient tolerated the procedure very well to postinjection instructions given.  Impression and Recommendations:     This case required medical decision making of moderate complexity.

## 2013-11-22 NOTE — Assessment & Plan Note (Signed)
Patient had injection as described above. Patient did tolerate the procedure very well. Patient will avoid wearing a counterforce brace anymore. Continue to wear the wrist brace at night. Continue the nitroglycerin patch and we did increase her Neurontin short term to 300 mg at night. Patient and will followup again in 4 weeks for further evaluation. If this continues we may need to consider an MRI and/or look for more of a cervical radiculopathy which I think will be unnecessary.

## 2013-11-22 NOTE — Assessment & Plan Note (Signed)
As stated above. 

## 2013-11-22 NOTE — Patient Instructions (Signed)
About 1 day you may not like me Increase neurontin to 300mg  at night for next 2 weeks then go back down.  Continue the brace at night for wrist, no more counter-brace Could try 1/2 patch of nitro.  Come back again in 4 weeks.

## 2013-12-06 ENCOUNTER — Other Ambulatory Visit (INDEPENDENT_AMBULATORY_CARE_PROVIDER_SITE_OTHER): Payer: 59

## 2013-12-06 ENCOUNTER — Encounter: Payer: Self-pay | Admitting: Cardiology

## 2013-12-06 ENCOUNTER — Ambulatory Visit (INDEPENDENT_AMBULATORY_CARE_PROVIDER_SITE_OTHER): Payer: 59 | Admitting: Cardiology

## 2013-12-06 VITALS — BP 130/84 | HR 72 | Ht 62.5 in

## 2013-12-06 DIAGNOSIS — R0789 Other chest pain: Secondary | ICD-10-CM

## 2013-12-06 DIAGNOSIS — I10 Essential (primary) hypertension: Secondary | ICD-10-CM

## 2013-12-06 DIAGNOSIS — R7309 Other abnormal glucose: Secondary | ICD-10-CM

## 2013-12-06 DIAGNOSIS — R739 Hyperglycemia, unspecified: Secondary | ICD-10-CM

## 2013-12-06 LAB — BASIC METABOLIC PANEL
BUN: 18 mg/dL (ref 6–23)
CALCIUM: 9.1 mg/dL (ref 8.4–10.5)
CO2: 28 mEq/L (ref 19–32)
Chloride: 108 mEq/L (ref 96–112)
Creatinine, Ser: 0.8 mg/dL (ref 0.4–1.2)
GFR: 74.76 mL/min (ref >60.00)
GLUCOSE: 123 mg/dL — AB (ref 70–99)
Potassium: 4.2 mEq/L (ref 3.5–5.1)
Sodium: 140 mEq/L (ref 135–145)

## 2013-12-06 LAB — HEMOGLOBIN A1C: Hgb A1c MFr Bld: 7.3 % — ABNORMAL HIGH (ref 4.6–6.5)

## 2013-12-06 LAB — LIPID PANEL
Cholesterol: 188 mg/dL (ref 0–200)
HDL: 59.6 mg/dL (ref >39.00)
LDL CALC: 114 mg/dL — AB (ref 0–99)
Total CHOL/HDL Ratio: 3
Triglycerides: 73 mg/dL (ref 0.0–149.0)
VLDL: 14.6 mg/dL (ref 0.0–40.0)

## 2013-12-06 LAB — HEPATIC FUNCTION PANEL
ALT: 49 U/L — ABNORMAL HIGH (ref 0–35)
AST: 27 U/L (ref 0–37)
Albumin: 3.7 g/dL (ref 3.5–5.2)
Alkaline Phosphatase: 66 U/L (ref 39–117)
BILIRUBIN DIRECT: 0 mg/dL (ref 0.0–0.3)
BILIRUBIN TOTAL: 0.3 mg/dL (ref 0.3–1.2)
Total Protein: 6.8 g/dL (ref 6.0–8.3)

## 2013-12-06 NOTE — Patient Instructions (Signed)
Continue your current therapy  I will see you in 6 months.   

## 2013-12-06 NOTE — Progress Notes (Signed)
Rhonda [name] Date of Birth: 04-Dec-1955 Medical Record [id number]  History of Present Illness: Rhonda [name] is seen today for follow up of hypertension.  She is intolerant to lisinopril because of cough. She developed swelling on amlodipine. She does have some increased stressors at home. She also is being treated for trigeminal neuralgia. On her last visit we added HCTZ 25 mg daily and this has resulted in nice reduction of BP. She is tolerating this well. Plans to join the [employer] and try to lose 20 lbs. Stress myoview in November was normal. No further chest tightness.  Current Outpatient Prescriptions on File Prior to Visit  Medication Sig Dispense Refill  . ALPRAZolam (XANAX) 0.25 MG tablet Take 0.25 mg by mouth at bedtime as needed for sleep.      Marland Kitchen aspirin EC 81 MG tablet Take 81 mg by mouth daily.      . baclofen (LIORESAL) 10 MG tablet Take 10 mg by mouth 3 (three) times daily.      . fish oil-omega-3 fatty acids 1000 MG capsule Take 2 g by mouth daily.      Marland Kitchen gabapentin (NEURONTIN) 100 MG capsule Take 100 mg by mouth 2 (two) times daily as needed (pain).      . Glucosamine 500 MG CAPS Take 1 capsule by mouth daily.      . hydrochlorothiazide (HYDRODIURIL) 25 MG tablet Take 1 tablet (25 mg total) by mouth daily.  90 tablet  3  . metoprolol succinate (TOPROL-XL) 100 MG 24 hr tablet Take 100 mg by mouth every evening. Take with or immediately following a meal.      . Multiple Vitamin (MULTIVITAMIN WITH MINERALS) TABS tablet Take 1 tablet by mouth daily.      Marland Kitchen venlafaxine XR (EFFEXOR-XR) 75 MG 24 hr capsule Take 75 mg by mouth every morning.      . vitamin E 400 UNIT capsule Take 400 Units by mouth daily.       No current facility-administered medications on file prior to visit.    Allergies  Allergen Reactions  . Amlodipine Swelling  . Lisinopril Cough    Past Medical History  Diagnosis Date  . Hypertension   . Anxiety   . Depression   . Hepatitis     ? B when age 18  . Trigeminal  neuralgia   . HTN (hypertension) 09/28/2013    Past Surgical History  Procedure Laterality Date  . Breast surgery      age 61-benign,  . Brain surgery  2013    trigeminal neuralgia  . Colonoscopy with propofol N/A 08/01/2013    Procedure: COLONOSCOPY WITH PROPOFOL;  Surgeon: Garlan Fair, MD;  Location: [facility];  Service: Endoscopy;  Laterality: N/A;  . Craniotomy      History  Smoking status  . Never Smoker   Smokeless tobacco  . Not on file    History  Alcohol Use  . Yes    Comment: occassionally wine    Family History  Problem Relation Age of Onset  . Arthritis Mother   . Hyperlipidemia Mother   . Heart disease Mother   . Stroke Mother   . Hypertension Mother   . Diabetes Mother   . Arthritis Father   . Hyperlipidemia Father   . Heart disease Father   . Stroke Father   . Hypertension Father   . Diabetes Father   . Cancer Maternal Grandmother   . Kidney disease Maternal Grandmother   . Mental illness  Maternal Grandmother   . Hypertension Maternal Grandmother   . Heart disease Maternal Grandmother   . Cancer Maternal Grandfather   . Kidney disease Maternal Grandfather   . Mental illness Maternal Grandfather   . Hypertension Maternal Grandfather   . Heart disease Maternal Grandfather   . Cancer Paternal Grandmother   . Kidney disease Paternal Grandmother   . Mental illness Paternal Grandmother   . Hypertension Paternal Grandmother   . Heart disease Paternal Grandmother   . Cancer Paternal Grandfather   . Kidney disease Paternal Grandfather   . Mental illness Paternal Grandfather   . Hypertension Paternal Grandfather   . Heart disease Paternal Grandfather     Review of Systems: The review of systems is positive for trigeminal neuralgia.  She underwent a craniotomy procedure for this. All other systems were reviewed and are negative.  Physical Exam: BP 130/84  Pulse 72  Ht 5' 2.5" (1.588 m) She is a pleasant, overweight white female in no  acute distress. HEENT: Normocephalic, atraumatic. Pupils equal round and reactive. Sclera clear. Oropharynx is clear. Neck: No JVD or bruits. No adenopathy or thyromegaly. Lungs: Clear Cardiovascular: Regular rate and rhythm, normal S1 and S2, no gallop or murmur. Abdomen: Obese, soft, nontender. No masses or bruits. No hepatosplenomegaly. Bowel sounds are positive. Extremities: No cyanosis or edema. Pedal pulses are 2+. Skin: Warm and dry Neuro: Alert and oriented x3. Cranial nerves II through XII are intact.    LABORATORY DATA: Pending today.  Cardiology Nuclear Med Study  Rhonda [name] is a 58 y.o. female MRN : [id number] DOB: [birth year]  Procedure Date: 11/20/2013  Nuclear Med Background  Indication for Stress Test: Evaluation for Ischemia and Abnormal EKG  History: No Known prior hx of CAD; hx SVT; '03 Echo: EF 65-70%  Cardiac Risk Factors: Family History - CAD and Hypertension  Symptoms: Chest Tightness without exertion (last occurrence couple months ago), DOE, Palpitations and SOB  Nuclear Pre-Procedure  Caffeine/Decaff Intake: None  NPO After: 2:00am   Lungs: clear  O2 Sat: 98% on room air.  IV 0.9% NS with Angio Cath: 22g   IV Site: R Hand  IV Started by: Matilde Haymaker, RN   Chest Size (in): 42  Cup Size: DDD   Height: 5' 2.5" (1.588 m)  Weight: 201 lb (91.173 kg)   BMI: Body mass index is 36.15 kg/(m^2).  Tech Comments: Held toprol for [employer] Med Study  1 or 2 day study: 1 day  Stress Test Type: Stress   Reading MD: N/A  Order Authorizing Provider: Sahaana Weitman [place], MD   Resting Radionuclide: Technetium 27m Sestamibi  Resting Radionuclide Dose: 11.0 mCi   Stress Radionuclide: Technetium 87m Sestamibi  Stress Radionuclide Dose: 33.0 mCi   Stress Protocol  Rest HR: 71  Stress HR: 155   Rest BP: 157/97  Stress BP: 222/80   Exercise Time (min): 5:00  METS: 7.0   Predicted Max HR: 161 bpm  % Max HR: 96.27 bpm  Rate Pressure Product: 34410  Dose of Adenosine  (mg): n/a  Dose of Lexiscan: n/a mg   Dose of Atropine (mg): n/a  Dose of Dobutamine: n/a mcg/kg/min (at max HR)   Stress Test Technologist: Irven Baltimore, RN  Nuclear Technologist: Charlton Amor, CNMT   Rest Procedure: Myocardial perfusion imaging was performed at rest 45 minutes following the intravenous administration of Technetium 51m Sestamibi.  Rest ECG: NSR with non-specific ST-T wave changes  Stress Procedure: The patient exercised on the treadmill  utilizing the Bruce Protocol for 5:00 minutes, RPE=15. The patient stopped due to DOE and denied any chest pain. There was a hypertensive response to exercise. Technetium 33m Sestamibi was injected at peak exercise and myocardial perfusion imaging was performed after a brief delay.  Stress ECG: With stress, nonspecific STT wave changes increase.  QPS  Raw Data Images: Normal; no motion artifact; normal heart/lung ratio.  Stress Images: Normal homogeneous uptake in all areas of the myocardium.  Rest Images: Normal homogeneous uptake in all areas of the myocardium.  Subtraction (SDS): No evidence of ischemia.  Transient Ischemic Dilatation (Normal <1.22): 0.96  Lung/Heart Ratio (Normal <0.45): 0.35  Quantitative Gated Spect Images  QGS EDV: 54 ml  QGS ESV: 15 ml  Impression  Exercise Capacity: Good exercise capacity.  BP Response: Hypertensive blood pressure response.  Clinical Symptoms: No chest pain.  ECG Impression: No significant ST segment change suggestive of ischemia.  Comparison with Prior Nuclear Study: No previous nuclear study performed  Overall Impression: Low risk stress nuclear study. No evidence of ischemia or scar. Hypertensive response to exercise..  LV Ejection Fraction: 73%. LV Wall Motion: NL LV Function; NL Wall Motion  Thomas Brackbill      Assessment / Plan: 1. Chest tightness - resolved. Normal myoview.  2. Hypertension. Significant improvement with the addition of HCTZ.  We discussed the importance of  sodium restriction and a heart healthy diet.  I recommended aerobic exercise 30-40 minutes a day. I will followup in 2 months. Will check fasting lab work today including chemistries, lipids, and A1c.  3. Intolerance to amlodipine and ACE inhibitors.  4. Trigeminal neuralgia.

## 2014-05-19 ENCOUNTER — Encounter: Payer: 59 | Attending: Family Medicine

## 2014-05-19 VITALS — Ht 63.0 in | Wt 200.6 lb

## 2014-05-19 DIAGNOSIS — E119 Type 2 diabetes mellitus without complications: Secondary | ICD-10-CM | POA: Diagnosis present

## 2014-05-19 DIAGNOSIS — Z713 Dietary counseling and surveillance: Secondary | ICD-10-CM | POA: Diagnosis not present

## 2014-05-21 NOTE — Progress Notes (Signed)
Patient was seen on 05/19/14 for the complete diabetes self-management series at the [facility]. This is a part of the [facility] [employer].  Current A1c = 6.5%  Handouts given during class include:  Living Well with Diabetes book  Carb Counting and Meal Planning book  Meal Plan Card  Carbohydrate guide  Meal planning worksheet  Low Sodium Flavoring Tips  The diabetes portion plate  Low Carbohydrate Snack Suggestions  A1c to eAG Conversion Chart  Diabetes Medications  Stress Management  Diabetes Recommended Care Schedule  Diabetes Success Plan  Core Class Satisfaction Survey  The following learning objectives were met by the patient during this course:  Describe diabetes  State some common risk factors for diabetes  Defines the role of glucose and insulin  Identifies type of diabetes and pathophysiology  Describe the relationship between diabetes and cardiovascular risk  State the members of the Healthcare Team  States the rationale for glucose monitoring  State when to test glucose  State their individual Target Range  State the importance of logging glucose readings  Describe how to interpret glucose readings  Identifies A1C target  Explain the correlation between A1c and eAG values  State symptoms and treatment of high blood glucose  State symptoms and treatment of low blood glucose  Explain proper technique for glucose testing  Identifies proper sharps disposal  Describe the role of different macronutrients on glucose  Explain how carbohydrates affect blood glucose  State what foods contain the most carbohydrates  Demonstrate carbohydrate counting  Demonstrate how to read Nutrition Facts food label  Describe effects of various fats on heart health  Describe the importance of good nutrition for health and healthy eating strategies  Describe techniques for managing your shopping, cooking and meal  planning  List strategies to follow meal plan when dining out  Describe the effects of alcohol on glucose and how to use it safely   State the amount of activity recommended for healthy living   Describe activities suitable for individual needs   Identify ways to regularly incorporate activity into daily life   Identify barriers to activity and ways to over come these barriers  Identify diabetes medications being personally used and their primary action for lowering glucose and possible side effects   Describe role of stress on blood glucose and develop strategies to address psychosocial issues   Identify diabetes complications and ways to prevent them  Explain how to manage diabetes during illness   Evaluate success in meeting personal goal   Establish 2-3 goals that they will plan to diligently work on until they return for the  57-monthfollow-up visit  Goals:  Follow Diabetes Meal Plan as instructed  Eat 3 meals and 2 snacks, every 3-5 hrs  Limit carbohydrate intake to 45 grams carbohydrate/meal Limit carbohydrate intake to 15 grams carbohydrate/snack Add lean protein foods to meals/snacks  Monitor glucose levels as instructed by your doctor  Aim for 15-30 mins of physical activity daily as tolerated  Bring food record and glucose log to all healthcare visits  Your patient has established the following 4 month goals in their individualized success plan: I will count my carb choices at most meals and snacks I will increase my activity level at least 3 days a week I will take my diabetes medications as scheduled I will test my glucose at least 2 times a day, 7 days a week To help manage stress I will water aerobics at least 2  times a week Your patient has identified these potential barriers to change:  Motivation  stress  Your patient has identified their diabetes self-care support plan as  [facility]  American Diabetes Association web site  Family  support  Co-worker support  Plan: Follow up with [facility]

## 2014-11-15 ENCOUNTER — Encounter: Payer: Self-pay | Admitting: Family Medicine

## 2014-11-15 ENCOUNTER — Ambulatory Visit (INDEPENDENT_AMBULATORY_CARE_PROVIDER_SITE_OTHER): Payer: 59 | Admitting: Family Medicine

## 2014-11-15 VITALS — BP 126/80 | HR 62 | Ht 62.5 in | Wt 194.0 lb

## 2014-11-15 DIAGNOSIS — M999 Biomechanical lesion, unspecified: Secondary | ICD-10-CM

## 2014-11-15 DIAGNOSIS — M546 Pain in thoracic spine: Secondary | ICD-10-CM | POA: Insufficient documentation

## 2014-11-15 DIAGNOSIS — M9901 Segmental and somatic dysfunction of cervical region: Secondary | ICD-10-CM

## 2014-11-15 DIAGNOSIS — M9908 Segmental and somatic dysfunction of rib cage: Secondary | ICD-10-CM

## 2014-11-15 DIAGNOSIS — M9902 Segmental and somatic dysfunction of thoracic region: Secondary | ICD-10-CM

## 2014-11-15 MED ORDER — CYCLOBENZAPRINE HCL 10 MG PO TABS: 10.0000 mg | ORAL_TABLET | Freq: Three times a day (TID) | ORAL | Status: DC | PRN

## 2014-11-15 NOTE — Assessment & Plan Note (Signed)
Patient's thoracic back pain is multifactorial. Patient's pain is likely secondary to more of the muscle imbalances, poor core strength, as well as patient's employment. We discussed proper lifting positioning, home exercises and showed proper technique, we discussed icing protocol. Patient is going to try disruption medications that were given to her including anti-inflammatories and muscle relaxers. Patient did respond fairly well to osteopathic manipulation will come back again in 3 weeks for further evaluation and treatment. Differential includes a interspinous ligament injury.

## 2014-11-15 NOTE — Patient Instructions (Addendum)
Great to see you!!!! ICe after exercises for 10 minutes. Especially after activity.  Meloxicam daily for 3 days.   Flexeril when you need it.  Look up saffron 300mg  daily.  See me in 3-4 weeks if not perfect!!! Standing:  Secure a rubber exercise band/tubing so that it is at the height of your shoulders when you are either standing or sitting on a firm arm-less chair.  Grasp an end of the band/tubing in each hand and have your palms face each other. Straighten your elbows and lift your hands straight in front of you at shoulder height. Step back away from the secured end of band/tubing until it becomes tense.  Squeeze your shoulder blades together. Keeping your elbows locked and your hands at shoulder-height, bring your hands out to your side.  Hold __________ seconds. Slowly ease the tension on the band/tubing as you reverse the directions and return to the starting position. Repeat __________ times. Complete this exercise __________ times per day. STRENGTH - Scapular Retractors  Secure a rubber exercise band/tubing so that it is at the height of your shoulders when you are either standing or sitting on a firm arm-less chair.  With a palm-down grip, grasp an end of the band/tubing in each hand. Straighten your elbows and lift your hands straight in front of you at shoulder height. Step back away from the secured end of band/tubing until it becomes tense.  Squeezing your shoulder blades together, draw your elbows back as you bend them. Keep your upper arm lifted away from your body throughout the exercise.  Hold __________ seconds. Slowly ease the tension on the band/tubing as you reverse the directions and return to the starting position. Repeat __________ times. Complete this exercise __________ times per day. STRENGTH - Shoulder Extensors   Secure a rubber exercise band/tubing so that it is at the height of your shoulders when you are either standing or sitting on a firm arm-less  chair.  With a thumbs-up grip, grasp an end of the band/tubing in each hand. Straighten your elbows and lift your hands straight in front of you at shoulder height. Step back away from the secured end of band/tubing until it becomes tense.  Squeezing your shoulder blades together, pull your hands down to the sides of your thighs. Do not allow your hands to go behind you.  Hold for __________ seconds. Slowly ease the tension on the band/tubing as you reverse the directions and return to the starting position. Repeat __________ times. Complete this exercise __________ times per day.  STRENGTH - Scapular Retractors and External Rotators  Secure a rubber exercise band/tubing so that it is at the height of your shoulders when you are either standing or sitting on a firm arm-less chair.  With a palm-down grip, grasp an end of the band/tubing in each hand. Bend your elbows 90 degrees and lift your elbows to shoulder height at your sides. Step back away from the secured end of band/tubing until it becomes tense.  Squeezing your shoulder blades together, rotate your shoulder so that your upper arm and elbow remain stationary, but your fists travel upward to head-height.  Hold __________ for seconds. Slowly ease the tension on the band/tubing as you reverse the directions and return to the starting position. Repeat __________ times. Complete this exercise __________ times per day.  STRENGTH - Scapular Retractors and External Rotators, Rowing  Secure a rubber exercise band/tubing so that it is at the height of your shoulders when you are either  standing or sitting on a firm arm-less chair.  With a palm-down grip, grasp an end of the band/tubing in each hand. Straighten your elbows and lift your hands straight in front of you at shoulder height. Step back away from the secured end of band/tubing until it becomes tense.  Step 1: Squeeze your shoulder blades together. Bending your elbows, draw your hands  to your chest as if you are rowing a boat. At the end of this motion, your hands and elbow should be at shoulder-height and your elbows should be out to your sides.  Step 2: Rotate your shoulder to raise your hands above your head. Your forearms should be vertical and your upper-arms should be horizontal.  Hold for __________ seconds. Slowly ease the tension on the band/tubing as you reverse the directions and return to the starting position. Repeat __________ times. Complete this exercise __________ times per day.  STRENGTH - Scapular Retractors and Elevators  Secure a rubber exercise band/tubing so that it is at the height of your shoulders when you are either standing or sitting on a firm arm-less chair.  With a thumbs-up grip, grasp an end of the band/tubing in each hand. Step back away from the secured end of band/tubing until it becomes tense.  Squeezing your shoulder blades together, straighten your elbows and lift your hands straight over your head.  Hold for __________ seconds. Slowly ease the tension on the band/tubing as you reverse the directions and return to the starting position. Repeat __________ times. Complete this exercise __________ times per day.  Document Released: 10/26/2005 Document Revised: 01/18/2012 Document Reviewed: 02/07/2009 [facility] Patient Information 2015 [place], [place]. This information is not intended to replace advice given to you by your health care provider. Make sure you discuss any questions you have with your health care provider.

## 2014-11-15 NOTE — Progress Notes (Signed)
  [name], [place] [address] Phone: [phone] Subjective:     CC: back pain  [id number] Rhonda [name] is a 59 y.o. female coming in with complaint of thoracic back pain. Patient has had this on and off for approximately 1 year but the frequency is increased. Patient states that it seems to be in the thoracic spine mostly at the bra line on the left side. They can catch her when she is doing activity and then fully goes away. Patient has been working out on a more frequent basis recently. Patient denies any radiation down the arms or any numbness or tingling. Patient rates the severity is 5 out of 10. Patient will like to be pain-free if possible. Does respond to over-the-counter anti-inflammatories.     Past medical history, social, surgical and family history all reviewed in electronic medical record.   Review of Systems: No headache, visual changes, nausea, vomiting, diarrhea, constipation, dizziness, abdominal pain, skin rash, fevers, chills, night sweats, weight loss, swollen lymph nodes, body aches, joint swelling, muscle aches, chest pain, shortness of breath, mood changes.   Objective Blood pressure 126/80, pulse 62, height 5' 2.5" (1.588 m), weight 194 lb (87.998 kg), SpO2 98 %.  General: No apparent distress alert and oriented x3 mood and affect normal, dressed appropriately.  HEENT: Pupils equal, extraocular movements intact  Respiratory: Patient's speak in full sentences and does not appear short of breath  Cardiovascular: No lower extremity edema, non tender, no erythema  Skin: Warm dry intact with no signs of infection or rash on extremities or on axial skeleton.  Abdomen: Soft nontender  Neuro: Cranial nerves II through XII are intact, neurovascularly intact in all extremities with 2+ DTRs and 2+ pulses.  Lymph: No lymphadenopathy of posterior or anterior cervical chain or axillae bilaterally.  Gait normal with good  balance and coordination.  MSK:  Non tender with full range of motion and good stability and symmetric strength and tone of shoulders, elbows, wrist, hip, knee and ankles bilaterally.  Back Exam:  Inspection: Unremarkable  Motion: Flexion 45 deg, Extension 45 deg, Side Bending to 45 deg bilaterally,  Rotation to 45 deg bilaterally  SLR laying: Negative  XSLR laying: Negative  Palpable tenderness: Tenderness in the mid thoracic spine just to the left side of midline @ T7 FABER: negative. Sensory change: Gross sensation intact to all lumbar and sacral dermatomes.  Reflexes: 2+ at both patellar tendons, 2+ at achilles tendons, Babinski's downgoing.  Strength at foot  Plantar-flexion: 5/5 Dorsi-flexion: 5/5 Eversion: 5/5 Inversion: 5/5  Leg strength  Quad: 5/5 Hamstring: 5/5 Hip flexor: 5/5 Hip abductors: 5/5  Gait unremarkable.  Osteopathic findings Cervical C4 F RS right Thoracic T3 extended rotated and side bent right  T7 extended rotated and side bent left with exhaled seventh rib    Impression and Recommendations:     This case required medical decision making of moderate complexity.

## 2014-11-15 NOTE — Assessment & Plan Note (Signed)
Decision today to treat with OMT was based on Physical Exam  After verbal consent patient was treated with HVLA, ME techniques in cervical, thoracic, and rib areas  Patient tolerated the procedure well with improvement in symptoms  Patient given exercises, stretches and lifestyle modifications  See medications in patient instructions if given  Patient will follow up in 3 weeks

## 2015-01-11 ENCOUNTER — Telehealth: Payer: Self-pay | Admitting: Family Medicine

## 2015-01-11 NOTE — Telephone Encounter (Signed)
Pt scheduled  

## 2015-01-11 NOTE — Telephone Encounter (Signed)
lmovm for pt to return call. She can be seen  @ 11:30am.

## 2015-01-11 NOTE — Telephone Encounter (Signed)
Pt request to be work in for wrist swollen,cant move it, pt stated she can only do 01/16/15. Please advise.

## 2015-01-16 ENCOUNTER — Ambulatory Visit (INDEPENDENT_AMBULATORY_CARE_PROVIDER_SITE_OTHER): Payer: 59 | Admitting: Family Medicine

## 2015-01-16 ENCOUNTER — Other Ambulatory Visit (INDEPENDENT_AMBULATORY_CARE_PROVIDER_SITE_OTHER): Payer: 59

## 2015-01-16 ENCOUNTER — Encounter: Payer: Self-pay | Admitting: Family Medicine

## 2015-01-16 VITALS — BP 142/82 | HR 69 | Ht 62.5 in

## 2015-01-16 DIAGNOSIS — M25531 Pain in right wrist: Secondary | ICD-10-CM

## 2015-01-16 DIAGNOSIS — M654 Radial styloid tenosynovitis [de Quervain]: Secondary | ICD-10-CM

## 2015-01-16 NOTE — Progress Notes (Signed)
Pre visit review using our clinic review tool, if applicable. No additional management support is needed unless otherwise documented below in the visit note. 

## 2015-01-16 NOTE — Assessment & Plan Note (Signed)
Patient will try conservative therapy with topical anti-inflammatories, over-the-counter natural supplementations and patient was put in a thumb spica. Patient will try this conservative therapy and then come back again in 3 weeks. If continuing to have difficulty we will consider some injection. She does her some mild underlying CMC arthritis we will need to consider

## 2015-01-16 NOTE — Patient Instructions (Signed)
Always good to see you Ice 20 minutes 2 times daily. Usually after activity and before bed. Wear brace day and night for 2 weeks, then sleep in another 2 weeks.  Try the pennsaid topically 2 times a day Exercises 3 times a week.  See me in 3 weeks.

## 2015-01-16 NOTE — Progress Notes (Signed)
  [name] [place], [place] [address] Phone: [phone] Subjective:     CC: Right wrist pain  LDJ:[id number] Rhonda [name] is a 59 y.o. female coming in with complaint of right wrist pain,  patient states that this right wrist. Started with the course last several weeks. Patient states it significantly worse. Seems to be more localized around her right thumb. Patient states this is more of a dull throbbing pain and conditioning pain that radiates up her arm somewhat. Patient denies any significant tingling. Patient states that it can affect her daily activities and her work. Patient states when trying to pick up her granddaughter it can be somewhat painful as well. Patient rates the severity of pain a 6 out of 10. Patient has been doing anti-inflammatories and icing which has been very beneficial.     Past medical history, social, surgical and family history all reviewed in electronic medical record.   Review of Systems: No headache, visual changes, nausea, vomiting, diarrhea, constipation, dizziness, abdominal pain, skin rash, fevers, chills, night sweats, weight loss, swollen lymph nodes, body aches, joint swelling, muscle aches, chest pain, shortness of breath, mood changes.   Objective Blood pressure 142/82, pulse 69, height 5' 2.5" (1.588 m), weight 0 lb (0 kg), SpO2 97 %.  General: No apparent distress alert and oriented x3 mood and affect normal, dressed appropriately.  HEENT: Pupils equal, extraocular movements intact  Respiratory: Patient's speak in full sentences and does not appear short of breath  Cardiovascular: No lower extremity edema, non tender, no erythema  Skin: Warm dry intact with no signs of infection or rash on extremities or on axial skeleton.  Abdomen: Soft nontender  Neuro: Cranial nerves II through XII are intact, neurovascularly intact in all extremities with 2+ DTRs and 2+ pulses.  Lymph: No lymphadenopathy of  posterior or anterior cervical chain or axillae bilaterally.  Gait normal with good balance and coordination.  MSK:  Non tender with full range of motion and good stability and symmetric strength and tone of shoulders, elbows,  hip, knee and ankles bilaterally.  Wrist: Right Inspection normal with no visible erythema or swelling. ROM smooth and normal with good flexion and extension and ulnar/radial deviation that is symmetrical with opposite wrist. Palpation is normal over metacarpals, navicular, lunate, and TFCC; tendons without tenderness/ swelling No snuffbox tenderness. No tenderness over Canal of Guyon. Strength 5/5 in all directions without pain. Positive Finkelstein, negative tinel's and phalens. Negative Watson's test. Contralateral wrist unremarkable.   MSK US performed of: right wrist This study was ordered, performed, and interpreted by Charlann Boxer D.O.  Wrist: Abductor pollicis longus does have significant hypoechoic changes in patient does have what appears to be a tear. Increasing Doppler flow noted. TFCC intact. Scapholunate ligament intact. Carpal tunnel visualized and median nerve area normal, flexor tendons all normal in appearance without fraying, tears, or sheath effusions. Power doppler signal normal. Mild CMC arthritis  IMPRESSION:  De Quervain's tenosynovitis with tear     Impression and Recommendations:     This case required medical decision making of moderate complexity.

## 2015-01-21 ENCOUNTER — Ambulatory Visit (INDEPENDENT_AMBULATORY_CARE_PROVIDER_SITE_OTHER): Payer: 59 | Admitting: Family Medicine

## 2015-01-21 ENCOUNTER — Encounter: Payer: Self-pay | Admitting: Family Medicine

## 2015-01-21 ENCOUNTER — Ambulatory Visit (INDEPENDENT_AMBULATORY_CARE_PROVIDER_SITE_OTHER)
Admission: RE | Admit: 2015-01-21 | Discharge: 2015-01-21 | Disposition: A | Discharge status: Home / Self Care | Payer: 59 | Source: Ambulatory Visit | Attending: Family Medicine | Admitting: Family Medicine

## 2015-01-21 ENCOUNTER — Other Ambulatory Visit (INDEPENDENT_AMBULATORY_CARE_PROVIDER_SITE_OTHER): Payer: 59

## 2015-01-21 VITALS — BP 118/82 | HR 78 | Ht 62.5 in

## 2015-01-21 DIAGNOSIS — M25531 Pain in right wrist: Secondary | ICD-10-CM

## 2015-01-21 DIAGNOSIS — M654 Radial styloid tenosynovitis [de Quervain]: Secondary | ICD-10-CM

## 2015-01-21 NOTE — Progress Notes (Signed)
[name] [place], [place] [address] Phone: [phone] Subjective:     CC: Right wrist pain  [id number] Rhonda [name] is a 59 y.o. female coming in with complaint of right wrist pain,  patient was found to have severe de Quervain's tenosynovitis as well as a tear in the tendon itself. Patient states that the pain continues to hurt her even with the brace. Patient does not want to lose a significant amount of time and patient would like to start getting this better quicker. Patient was wondering if surgical intervention is what's necessary.      Past medical history, social, surgical and family history all reviewed in electronic medical record.   Review of Systems: No headache, visual changes, nausea, vomiting, diarrhea, constipation, dizziness, abdominal pain, skin rash, fevers, chills, night sweats, weight loss, swollen lymph nodes, body aches, joint swelling, muscle aches, chest pain, shortness of breath, mood changes.   Objective Blood pressure 118/82, pulse 78, height 5' 2.5" (1.588 m), weight 0 lb (0 kg), SpO2 95 %.  General: No apparent distress alert and oriented x3 mood and affect normal, dressed appropriately.  HEENT: Pupils equal, extraocular movements intact  Respiratory: Patient's speak in full sentences and does not appear short of breath  Cardiovascular: No lower extremity edema, non tender, no erythema  Skin: Warm dry intact with no signs of infection or rash on extremities or on axial skeleton.  Abdomen: Soft nontender  Neuro: Cranial nerves II through XII are intact, neurovascularly intact in all extremities with 2+ DTRs and 2+ pulses.  Lymph: No lymphadenopathy of posterior or anterior cervical chain or axillae bilaterally.  Gait normal with good balance and coordination.  MSK:  Non tender with full range of motion and good stability and symmetric strength and tone of shoulders, elbows,  hip, knee and ankles bilaterally.   Wrist: Right Inspection normal with no visible erythema or swelling. ROM smooth and normal with good flexion and extension and ulnar/radial deviation that is symmetrical with opposite wrist. Palpation is normal over metacarpals, navicular, lunate, and TFCC; tendons without tenderness/ swelling No snuffbox tenderness. No tenderness over Canal of Guyon. Strength 5/5 in all directions without pain. Positive Finkelstein, negative tinel's and phalens. Negative Watson's test. Contralateral wrist unremarkable.   MSK US performed of: right wrist This study was ordered, performed, and interpreted by Charlann Boxer D.O.  Wrist: Abductor pollicis longus does have significant hypoechoic changes in patient does have what appears to be a tear. Increasing Doppler flow noted. TFCC intact. Scapholunate ligament intact. Carpal tunnel visualized and median nerve area normal, flexor tendons all normal in appearance without fraying, tears, or sheath effusions. Power doppler signal normal. Mild CMC arthritis  IMPRESSION:  De Quervain's tenosynovitis with tear no change from previous exam  Procedure: Real-time Ultrasound Guided Injection of right abductor pollicis longus tendon sheath Device: GE Logiq E  Ultrasound guided injection is preferred based studies that show increased duration, increased effect, greater accuracy, decreased procedural pain, increased response rate, and decreased cost with ultrasound guided versus blind injection.  Verbal informed consent obtained.  Time-out conducted.  Noted no overlying erythema, induration, or other signs of local infection.  Skin prepped in a sterile fashion.  Local anesthesia: Topical Ethyl chloride.  With sterile technique and under real time ultrasound guidance: With a 25-gauge 1 inch needle patient was injected with 0.5 mL of 0.5% Marcaine and 0.5 mL of Kenalog 40 mg/dL.  Completed without difficulty  Pain immediately  resolved suggesting accurate placement  of the medication.  Advised to call if fevers/chills, erythema, induration, drainage, or persistent bleeding.  Images permanently stored and available for review in the ultrasound unit.  Impression: Technically successful ultrasound guided injection.      Impression and Recommendations:     This case required medical decision making of moderate complexity.

## 2015-01-21 NOTE — Patient Instructions (Signed)
Good to see you Gramig should be calling soon PT will be calling you Ice in 6 hours Wear brace for 48 hours tihen nightly Start exercises in 48 hours Call me Friday or Monday and if not good we will get MRI

## 2015-01-21 NOTE — Assessment & Plan Note (Signed)
Discussed with patient at this time. Patient was given an injection today and will start with formal physical therapy. We discussed icing regimen and continuing the brace at least nightly. Patient will see how she responds. Patient will be referred to a hand surgeon as well for further evaluation. Patient thinks that she may want surgical intervention and we discussed with patient that usually this problem does not need surgical intervention. Patient will call at the end of this week or early next week and if continuing to have pain we'll consider further imaging including an MRI to rule out any other abnormality that could be contributing to the pain.

## 2015-01-21 NOTE — Progress Notes (Signed)
Pre visit review using our clinic review tool, if applicable. No additional management support is needed unless otherwise documented below in the visit note. 

## 2015-01-25 ENCOUNTER — Telehealth: Payer: Self-pay | Admitting: Family Medicine

## 2015-01-25 ENCOUNTER — Encounter: Payer: Self-pay | Admitting: *Deleted

## 2015-01-25 NOTE — Telephone Encounter (Signed)
Pt called in said that her wrist is feeling a lot better.  But she needs a note to go back to work .

## 2015-01-25 NOTE — Telephone Encounter (Signed)
Spoke to pt, faxed letter to [phone] Attn:care coordinator

## 2015-02-27 ENCOUNTER — Encounter: Payer: Self-pay | Admitting: *Deleted

## 2015-05-17 ENCOUNTER — Telehealth: Payer: Self-pay | Admitting: Family Medicine

## 2015-05-17 MED ORDER — PREDNISONE 50 MG PO TABS: ORAL_TABLET | ORAL | Status: DC

## 2015-05-17 NOTE — Telephone Encounter (Signed)
Pt called in said that she hurt her back at work and wanted to know if she could be worked in today ?

## 2015-05-17 NOTE — Telephone Encounter (Signed)
Discussed with pt, prednisone sent into pharmacy.

## 2015-05-17 NOTE — Telephone Encounter (Signed)
Not looking good.  Over booked today.  Can send in her in some prednisone or anti-inflammatory if she wants and can work her in  Next week. Apologize but already overbooked.

## 2015-11-13 MED FILL — metFORMIN HCL 500 MG TABS: 500 | 30 days supply | Qty: 30 | Fill #1

## 2015-11-18 ENCOUNTER — Encounter: Payer: Self-pay | Admitting: Family Medicine

## 2015-11-18 ENCOUNTER — Ambulatory Visit (INDEPENDENT_AMBULATORY_CARE_PROVIDER_SITE_OTHER): Payer: 59 | Admitting: Family Medicine

## 2015-11-18 ENCOUNTER — Other Ambulatory Visit (INDEPENDENT_AMBULATORY_CARE_PROVIDER_SITE_OTHER): Payer: 59

## 2015-11-18 VITALS — BP 132/82 | HR 78 | Ht 62.5 in

## 2015-11-18 DIAGNOSIS — M654 Radial styloid tenosynovitis [de Quervain]: Secondary | ICD-10-CM

## 2015-11-18 NOTE — Progress Notes (Signed)
[name] [place],  [address] Phone: [phone] Subjective:     CC: Right wrist pain follow-up  [id number] Rhonda [name] is a 60 y.o. female coming in with complaint of right wrist pain,  patient was found to have severe de Quervain's tenosynovitis as well as a tear in the tendon itself.  Patient was last seen 10 months ago and had an injection. Patient statesshe is doing very well until the last several weeks. Started having increasing pain. Having the sharp pain that is stopping her from certain activities. Patient had been out of work recently for trigeminal neuralgia and is going to be starting work again and feels that the pain right now would be intolerable to her work. Did respond very well to the injection previously with complete resolution of pain within 2 week she states. No numbness. States a little weakness secondary to the pain. Has not tried any home modalities such as wearing the brace again.   worsening symptoms overall  Patient's previous ultrasound was independently visualized by me today.  Past Medical History  Diagnosis Date  . Hypertension   . Anxiety   . Depression   . Hepatitis     ? B when age 11  . Trigeminal neuralgia   . HTN (hypertension) 09/28/2013   Past Surgical History  Procedure Laterality Date  . Breast surgery      age 71-benign,  . Brain surgery  2013    trigeminal neuralgia  . Colonoscopy with propofol N/A 08/01/2013    Procedure: COLONOSCOPY WITH PROPOFOL;  Surgeon: Garlan Fair, MD;  Location: [facility];  Service: Endoscopy;  Laterality: N/A;  . Craniotomy     Social History  Substance Use Topics  . Smoking status: Never Smoker   . Smokeless tobacco: None  . Alcohol Use: Yes     Comment: occassionally wine   Allergies  Allergen Reactions  . Amlodipine Swelling  . Lisinopril Cough   Family History  Problem Relation Age of Onset  . Arthritis Mother   . Hyperlipidemia  Mother   . Heart disease Mother   . Stroke Mother   . Hypertension Mother   . Diabetes Mother   . Arthritis Father   . Hyperlipidemia Father   . Heart disease Father   . Stroke Father   . Hypertension Father   . Diabetes Father   . Cancer Maternal Grandmother   . Kidney disease Maternal Grandmother   . Mental illness Maternal Grandmother   . Hypertension Maternal Grandmother   . Heart disease Maternal Grandmother   . Cancer Maternal Grandfather   . Kidney disease Maternal Grandfather   . Mental illness Maternal Grandfather   . Hypertension Maternal Grandfather   . Heart disease Maternal Grandfather   . Cancer Paternal Grandmother   . Kidney disease Paternal Grandmother   . Mental illness Paternal Grandmother   . Hypertension Paternal Grandmother   . Heart disease Paternal Grandmother   . Cancer Paternal Grandfather   . Kidney disease Paternal Grandfather   . Mental illness Paternal Grandfather   . Hypertension Paternal Grandfather   . Heart disease Paternal Grandfather      Review of Systems: No headache, visual changes, nausea, vomiting, diarrhea, constipation, dizziness, abdominal pain, skin rash, fevers, chills, night sweats, weight loss, swollen lymph nodes, body aches, joint swelling, muscle aches, chest pain, shortness of breath, mood changes.   Objective Blood pressure 132/82, pulse 78, height 5' 2.5" (1.588 m),  SpO2 97 %.  General: No apparent distress alert and oriented x3 mood and affect normal, dressed appropriately.  HEENT: Pupils equal, extraocular movements intact  Respiratory: Patient's speak in full sentences and does not appear short of breath  Cardiovascular: No lower extremity edema, non tender, no erythema  Skin: Warm dry intact with no signs of infection or rash on extremities or on axial skeleton.  Abdomen: Soft nontender  Neuro: Cranial nerves II through XII are intact, neurovascularly intact in all extremities with 2+ DTRs and 2+ pulses.  Lymph: No  lymphadenopathy of posterior or anterior cervical chain or axillae bilaterally.  Gait normal with good balance and coordination.  MSK:  Non tender with full range of motion and good stability and symmetric strength and tone of shoulders, elbows,  hip, knee and ankles bilaterally.  Wrist: Right Inspection normal with no visible erythema or swelling. ROM smooth and normal with good flexion and extension and ulnar/radial deviation that is symmetrical with opposite wrist. Palpation is normal over metacarpals, navicular, lunate, and TFCC; tendons without tenderness/ swelling No snuffbox tenderness. No tenderness over Canal of Guyon. Strength 5/5 in all directions without pain. Positive Finkelstein, negative tinel's and phalens. Negative Watson's test. Contralateral wrist unremarkable.   MSK US performed of: right wrist This study was ordered, performed, and interpreted by Charlann Boxer D.O.  Wrist: Abductor pollicis longus does have moderatehypoechoic changes but no tear appreciated. Increasing Doppler flow noted. TFCC intact. Scapholunate ligament intact. Carpal tunnel visualized and median nerve area normal, flexor tendons all normal in appearance without fraying, tears, or sheath effusions. Power doppler signal normal. Mild CMC arthritis  IMPRESSION:  De Quervain's tenosynovitis with no tear  Procedure: Real-time Ultrasound Guided Injection of right abductor pollicis longus tendon sheath Device: GE Logiq E  Ultrasound guided injection is preferred based studies that show increased duration, increased effect, greater accuracy, decreased procedural pain, increased response rate, and decreased cost with ultrasound guided versus blind injection.  Verbal informed consent obtained.  Time-out conducted.  Noted no overlying erythema, induration, or other signs of local infection.  Skin prepped in a sterile fashion.  Local anesthesia: Topical Ethyl chloride.  With sterile technique and under  real time ultrasound guidance: With a 25-gauge 1 inch needle patient was injected with 0.5 mL of 0.5% Marcaine and 0.5 mL of Kenalog 40 mg/dL.  Completed without difficulty  Pain immediately resolved suggesting accurate placement of the medication.  Advised to call if fevers/chills, erythema, induration, drainage, or persistent bleeding.  Images permanently stored and available for review in the ultrasound unit.  Impression: Technically successful ultrasound guided injection.      Impression and Recommendations:     This case required medical decision making of moderate complexity.

## 2015-11-18 NOTE — Patient Instructions (Addendum)
Good to see you Ice is your friend Try the brace again day and night for 1 week then nightly for 2 weeks.  Always good to see you! pennsaid pinkie amount topically 2 times daily as needed.  See me when you need me

## 2015-11-18 NOTE — Assessment & Plan Note (Signed)
Patient given repeat injection today. Discussed with patient about topical anti-inflammatory. Patient given trial size. We discussed icing regimen. We discussed home exercises. We discussed bracing again 23 hours a day for the next week and then nightly for 2 weeks. Patient will start a range of motion exercises 3 times a week. Patient will come back and see me again in 3-4 weeks if the pain is not completely resolved. We may need to consider his CMC injection as well.

## 2015-11-18 NOTE — Progress Notes (Signed)
Pre visit review using our clinic review tool, if applicable. No additional management support is needed unless otherwise documented below in the visit note. 

## 2015-11-21 MED FILL — OLMESARTAN MEDOXOMIL 40 MG: 40 | 90 days supply | Qty: 90 | Fill #1

## 2015-11-22 MED FILL — ZOLPIDEM TARTRATE 10 MG TAB: 10 | 90 days supply | Qty: 45 | Fill #0

## 2015-11-28 MED FILL — METOPROLOL SUCC ER 100 MG T: 100 | 30 days supply | Qty: 45 | Fill #0

## 2015-12-03 MED FILL — ALPRAZolam 0.5 MG TABS: 0.5 | 8 days supply | Qty: 30 | Fill #0

## 2015-12-06 ENCOUNTER — Other Ambulatory Visit: Payer: Self-pay | Admitting: Family Medicine

## 2015-12-06 DIAGNOSIS — N390 Urinary tract infection, site not specified: Secondary | ICD-10-CM

## 2015-12-06 MED ORDER — CIPROFLOXACIN HCL 500 MG PO TABS: 500.0000 mg | ORAL_TABLET | Freq: Two times a day (BID) | ORAL | Status: DC

## 2015-12-06 NOTE — Progress Notes (Signed)
Patient with dysuria on suppressive macrobid. Concern for resistant species.   Will get Ucx and treat empirically with ciprofloxacin and await cx results and change abx if necessary. Patient will return to care if sx continue despite treatment.   Caren Macadam, MD

## 2015-12-31 DIAGNOSIS — H52223 Regular astigmatism, bilateral: Secondary | ICD-10-CM | POA: Diagnosis not present

## 2015-12-31 DIAGNOSIS — H524 Presbyopia: Secondary | ICD-10-CM | POA: Diagnosis not present

## 2015-12-31 DIAGNOSIS — H5213 Myopia, bilateral: Secondary | ICD-10-CM | POA: Diagnosis not present

## 2016-01-03 ENCOUNTER — Other Ambulatory Visit: Payer: Self-pay

## 2016-01-03 NOTE — Patient Outreach (Signed)
[facility]) Care Management  01/03/2016  Rhonda [name] [birth year] [id number]   Phone call for high cost referral assessment.  Member s/p microvascular decompression 10/16/15 at [facility]. Member states she is feeling much better since her surgery.  States she is working and has no issues in which  she feels she needs assistance.  States her diabetes is well controlled and she does not want to be in the [facility] program at this time.  States she follows up with Dr. Laurann [place] regularly. Instructed on [place] to [employer]. Instructed on how to contact RNCM if needed.  Declines any further needs Member assessed with no further interventions needed. Peter Garter RN, [facility] Care Management Coordinator-Link to [place] Management [phone]

## 2016-01-28 MED FILL — ALPRAZolam 0.5 MG TABS: 0.5 | 7 days supply | Qty: 30 | Fill #0

## 2016-02-03 MED FILL — METOPROLOL SUCC ER 100 MG T: 100 | 90 days supply | Qty: 135 | Fill #1

## 2016-02-03 MED FILL — HYDROCHLOROTHIAZIDE 25 MG T: 25 | 90 days supply | Qty: 90 | Fill #0

## 2016-02-25 MED FILL — OLMESARTAN MEDOXOMIL 40 MG: 40 | 90 days supply | Qty: 90 | Fill #2

## 2016-02-27 ENCOUNTER — Ambulatory Visit: Payer: 59 | Admitting: Family

## 2016-02-27 DIAGNOSIS — M545 Low back pain: Secondary | ICD-10-CM | POA: Diagnosis not present

## 2016-02-27 DIAGNOSIS — M9904 Segmental and somatic dysfunction of sacral region: Secondary | ICD-10-CM | POA: Diagnosis not present

## 2016-02-27 DIAGNOSIS — M9903 Segmental and somatic dysfunction of lumbar region: Secondary | ICD-10-CM | POA: Diagnosis not present

## 2016-02-27 DIAGNOSIS — M9905 Segmental and somatic dysfunction of pelvic region: Secondary | ICD-10-CM | POA: Diagnosis not present

## 2016-02-27 MED FILL — METHYLPREDNISOLONE 4 MG TAB: 4 | 6 days supply | Qty: 21 | Fill #0

## 2016-02-27 MED FILL — METHOCARBAMOL 500 MG TABLET: 500 | 10 days supply | Qty: 30 | Fill #0

## 2016-02-28 DIAGNOSIS — M9904 Segmental and somatic dysfunction of sacral region: Secondary | ICD-10-CM | POA: Diagnosis not present

## 2016-02-28 DIAGNOSIS — M545 Low back pain: Secondary | ICD-10-CM | POA: Diagnosis not present

## 2016-02-28 DIAGNOSIS — M9903 Segmental and somatic dysfunction of lumbar region: Secondary | ICD-10-CM | POA: Diagnosis not present

## 2016-02-28 DIAGNOSIS — M9905 Segmental and somatic dysfunction of pelvic region: Secondary | ICD-10-CM | POA: Diagnosis not present

## 2016-03-04 DIAGNOSIS — M9905 Segmental and somatic dysfunction of pelvic region: Secondary | ICD-10-CM | POA: Diagnosis not present

## 2016-03-04 DIAGNOSIS — M545 Low back pain: Secondary | ICD-10-CM | POA: Diagnosis not present

## 2016-03-04 DIAGNOSIS — M9903 Segmental and somatic dysfunction of lumbar region: Secondary | ICD-10-CM | POA: Diagnosis not present

## 2016-03-04 DIAGNOSIS — M9904 Segmental and somatic dysfunction of sacral region: Secondary | ICD-10-CM | POA: Diagnosis not present

## 2016-03-05 MED FILL — ALPRAZolam 0.5 MG TABS: 0.5 | 7 days supply | Qty: 30 | Fill #0

## 2016-03-09 DIAGNOSIS — I10 Essential (primary) hypertension: Secondary | ICD-10-CM | POA: Diagnosis not present

## 2016-03-09 DIAGNOSIS — G2581 Restless legs syndrome: Secondary | ICD-10-CM | POA: Diagnosis not present

## 2016-03-09 DIAGNOSIS — H9209 Otalgia, unspecified ear: Secondary | ICD-10-CM | POA: Diagnosis not present

## 2016-03-09 DIAGNOSIS — M9905 Segmental and somatic dysfunction of pelvic region: Secondary | ICD-10-CM | POA: Diagnosis not present

## 2016-03-09 DIAGNOSIS — M9904 Segmental and somatic dysfunction of sacral region: Secondary | ICD-10-CM | POA: Diagnosis not present

## 2016-03-09 DIAGNOSIS — N951 Menopausal and female climacteric states: Secondary | ICD-10-CM | POA: Diagnosis not present

## 2016-03-09 DIAGNOSIS — M545 Low back pain: Secondary | ICD-10-CM | POA: Diagnosis not present

## 2016-03-09 DIAGNOSIS — M9903 Segmental and somatic dysfunction of lumbar region: Secondary | ICD-10-CM | POA: Diagnosis not present

## 2016-03-09 MED FILL — cloNIDine HCL 0.1 MG TABS: 0.1 | 30 days supply | Qty: 60 | Fill #0

## 2016-03-09 MED FILL — BACLOFEN 10 MG TABLET: 10 | 60 days supply | Qty: 30 | Fill #0

## 2016-04-03 MED FILL — cloNIDine HCL 0.1 MG TABS: 0.1 | 30 days supply | Qty: 60 | Fill #1

## 2016-04-03 MED FILL — ALPRAZolam 0.5 MG TABS: 0.5 | 7 days supply | Qty: 30 | Fill #0

## 2016-04-07 DIAGNOSIS — N951 Menopausal and female climacteric states: Secondary | ICD-10-CM | POA: Diagnosis not present

## 2016-04-07 DIAGNOSIS — I10 Essential (primary) hypertension: Secondary | ICD-10-CM | POA: Diagnosis not present

## 2016-04-07 MED FILL — VENLAFAXINE HCL 25 MG TAB: 25 | 30 days supply | Qty: 30 | Fill #0

## 2016-04-07 MED FILL — AMLODIPINE BESYLATE 5 MG TA: 5 | 90 days supply | Qty: 90 | Fill #0

## 2016-04-07 MED FILL — LISINOPRIL 40 MG TABLET: 40 | 90 days supply | Qty: 90 | Fill #0

## 2016-04-21 DIAGNOSIS — I10 Essential (primary) hypertension: Secondary | ICD-10-CM | POA: Diagnosis not present

## 2016-05-05 MED FILL — ALPRAZolam 0.5 MG TABS: 0.5 | 7 days supply | Qty: 30 | Fill #0

## 2016-05-11 MED FILL — BACLOFEN 10 MG TABLET: 10 | 60 days supply | Qty: 30 | Fill #1

## 2016-05-11 MED FILL — VENLAFAXINE HCL 25 MG TAB: 25 | 30 days supply | Qty: 30 | Fill #1

## 2016-05-14 DIAGNOSIS — M62838 Other muscle spasm: Secondary | ICD-10-CM | POA: Diagnosis not present

## 2016-05-14 DIAGNOSIS — M9903 Segmental and somatic dysfunction of lumbar region: Secondary | ICD-10-CM | POA: Diagnosis not present

## 2016-05-14 DIAGNOSIS — M25111 Fistula, right shoulder: Secondary | ICD-10-CM | POA: Diagnosis not present

## 2016-06-03 MED FILL — ALPRAZolam 0.5 MG TABS: 0.5 | 8 days supply | Qty: 30 | Fill #0

## 2016-06-16 MED FILL — metFORMIN HCL 500 MG TABS: 500 | 30 days supply | Qty: 30 | Fill #2

## 2016-06-29 MED FILL — HYDROCHLOROTHIAZIDE 25 MG T: 25 | 90 days supply | Qty: 90 | Fill #0

## 2016-07-06 MED FILL — ALPRAZolam 0.5 MG TABS: 0.5 | 8 days supply | Qty: 30 | Fill #0

## 2016-07-08 ENCOUNTER — Telehealth: Payer: Self-pay | Admitting: Family Medicine

## 2016-07-08 NOTE — Telephone Encounter (Signed)
This could be easy and could pu t in open slot on Friday

## 2016-07-08 NOTE — Telephone Encounter (Signed)
Pt scheduled 9.18.17 @ 1145

## 2016-07-08 NOTE — Telephone Encounter (Signed)
Pt would like to know if you could work her in by Friday?  She is stating she needs injection in wrist

## 2016-07-14 DIAGNOSIS — I10 Essential (primary) hypertension: Secondary | ICD-10-CM | POA: Diagnosis not present

## 2016-07-14 DIAGNOSIS — N951 Menopausal and female climacteric states: Secondary | ICD-10-CM | POA: Diagnosis not present

## 2016-07-14 MED FILL — AMLODIPINE BESYLATE 2.5 MG: 2.5 | 30 days supply | Qty: 30 | Fill #0

## 2016-07-14 MED FILL — CITALOPRAM HBR 40 MG TABLET: 40 | 30 days supply | Qty: 30 | Fill #0

## 2016-07-16 MED FILL — HYDROCHLOROTHIAZIDE 25 MG T: 25 | 30 days supply | Qty: 30 | Fill #1

## 2016-07-24 MED FILL — metFORMIN HCL 500 MG TABS: 500 | 30 days supply | Qty: 30 | Fill #3

## 2016-07-24 MED FILL — METOPROLOL SUCC ER 100 MG T: 100 | 60 days supply | Qty: 90 | Fill #2

## 2016-07-25 NOTE — Progress Notes (Signed)
[name] [place], [place] [address] Phone: [phone] Subjective:     CC: Right wrist pain follow-up  [id number]  Rhonda [name] is a 60 y.o. female coming in with complaint of right wrist pain,  patient was found to have severe de Quervain's tenosynovitis as well as a tear in the tendon itself. Patient was also found to have [place] arthritis. Patient was last seen 8 months ago and was given an injection. Patient states that it had worsening pain again. States that it is starting affect her job and daily activities. Patient rates the severity pain is 8 out of 10. Patient has been wearing the brace but does not seem to make it as long and the brace.   worsening symptoms overall  Past Medical History:  Diagnosis Date  . Anxiety   . Depression   . Hepatitis    ? B when age 60  . HTN (hypertension) 09/28/2013  . Hypertension   . Trigeminal neuralgia    Past Surgical History:  Procedure Laterality Date  . BRAIN SURGERY  2013   trigeminal neuralgia  . BREAST SURGERY     age 13-benign,  . COLONOSCOPY WITH PROPOFOL N/A 08/01/2013   Procedure: COLONOSCOPY WITH PROPOFOL;  Surgeon: Garlan Fair, MD;  Location: [facility];  Service: Endoscopy;  Laterality: N/A;  . CRANIOTOMY     Social History  Substance Use Topics  . Smoking status: Never Smoker  . Smokeless tobacco: Not on file  . Alcohol use Yes     Comment: occassionally wine   Allergies  Allergen Reactions  . Amlodipine Swelling  . Lisinopril Cough   Family History  Problem Relation Age of Onset  . Arthritis Mother   . Hyperlipidemia Mother   . Heart disease Mother   . Stroke Mother   . Hypertension Mother   . Diabetes Mother   . Arthritis Father   . Hyperlipidemia Father   . Heart disease Father   . Stroke Father   . Hypertension Father   . Diabetes Father   . Cancer Maternal Grandmother   . Kidney disease Maternal Grandmother   . Mental illness Maternal  Grandmother   . Hypertension Maternal Grandmother   . Heart disease Maternal Grandmother   . Cancer Maternal Grandfather   . Kidney disease Maternal Grandfather   . Mental illness Maternal Grandfather   . Hypertension Maternal Grandfather   . Heart disease Maternal Grandfather   . Cancer Paternal Grandmother   . Kidney disease Paternal Grandmother   . Mental illness Paternal Grandmother   . Hypertension Paternal Grandmother   . Heart disease Paternal Grandmother   . Cancer Paternal Grandfather   . Kidney disease Paternal Grandfather   . Mental illness Paternal Grandfather   . Hypertension Paternal Grandfather   . Heart disease Paternal Grandfather      Review of Systems: No headache, visual changes, nausea, vomiting, diarrhea, constipation, dizziness, abdominal pain, skin rash, fevers, chills, night sweats, weight loss, swollen lymph nodes, body aches, joint swelling, muscle aches, chest pain, shortness of breath, mood changes.   Objective  There were no vitals taken for this visit.  General: No apparent distress alert and oriented x3 mood and affect normal, dressed appropriately.  HEENT: Pupils equal, extraocular movements intact  Respiratory: Patient's speak in full sentences and does not appear short of breath  Cardiovascular: No lower extremity edema, non tender, no erythema  Skin: Warm dry intact with no signs of  infection or rash on extremities or on axial skeleton.  Abdomen: Soft nontender  Neuro: Cranial nerves II through XII are intact, neurovascularly intact in all extremities with 2+ DTRs and 2+ pulses.  Lymph: No lymphadenopathy of posterior or anterior cervical chain or axillae bilaterally.  Gait normal with good balance and coordination.  MSK:  Non tender with full range of motion and good stability and symmetric strength and tone of shoulders, elbows,  hip, knee and ankles bilaterally.  Wrist: Right Inspection normal with no visible erythema or swelling. ROM smooth  and normal with good flexion and extension and ulnar/radial deviation that is symmetrical with opposite wrist. Palpation is normal over metacarpals, navicular, lunate, and TFCC; tendons without tenderness/ swelling No snuffbox tenderness. No tenderness over Canal of [place]yon. Strength 5/5 in all directions without pain. Positive Finkelstein, negative tinel's and phalens. Negative Watson's test. Contralateral wrist unremarkable.  Positive grind test Contralateral hand unremarkable  Procedure: Real-time Ultrasound Guided Injection of right abductor pollicis longus tendon sheath Device: GE Logiq E  Ultrasound guided injection is preferred based studies that show increased duration, increased effect, greater accuracy, decreased procedural pain, increased response rate, and decreased cost with ultrasound guided versus blind injection.  Verbal informed consent obtained.  Time-out conducted.  Noted no overlying erythema, induration, or other signs of local infection.  Skin prepped in a sterile fashion.  Local anesthesia: Topical Ethyl chloride.  With sterile technique and under real time ultrasound guidance: With a 25-gauge 1 inch needle patient was injected with 0.5 mL of 0.5% Marcaine and 0.5 mL of Kenalog 40 mg/dL.  Completed without difficulty  Pain immediately resolved suggesting accurate placement of the medication.  Advised to call if fevers/chills, erythema, induration, drainage, or persistent bleeding.  Images permanently stored and available for review in the ultrasound unit.  Impression: Technically successful ultrasound guided injection.      Impression and Recommendations:     This case required medical decision making of moderate complexity.

## 2016-07-27 ENCOUNTER — Ambulatory Visit (INDEPENDENT_AMBULATORY_CARE_PROVIDER_SITE_OTHER): Payer: 59 | Admitting: Family Medicine

## 2016-07-27 ENCOUNTER — Other Ambulatory Visit: Payer: Self-pay

## 2016-07-27 ENCOUNTER — Encounter: Payer: Self-pay | Admitting: Family Medicine

## 2016-07-27 ENCOUNTER — Encounter: Payer: Self-pay | Admitting: *Deleted

## 2016-07-27 VITALS — BP 128/72 | HR 72

## 2016-07-27 DIAGNOSIS — M25532 Pain in left wrist: Secondary | ICD-10-CM | POA: Diagnosis not present

## 2016-07-27 DIAGNOSIS — M654 Radial styloid tenosynovitis [de Quervain]: Secondary | ICD-10-CM

## 2016-07-27 DIAGNOSIS — M19049 Primary osteoarthritis, unspecified hand: Secondary | ICD-10-CM

## 2016-07-27 DIAGNOSIS — M1990 Unspecified osteoarthritis, unspecified site: Secondary | ICD-10-CM

## 2016-07-27 MED ORDER — IBUPROFEN-FAMOTIDINE 800-26.6 MG PO TABS: ORAL_TABLET | ORAL | 3 refills | Status: DC

## 2016-07-27 NOTE — Assessment & Plan Note (Signed)
Patient given injection today and tolerated the procedure well. We discussed icing regimen. We discussed we can repeat this every 3-4 months. Patient also has some CMC arthritis and we can consider an intra-articular injection. Patient will come back and see me again on an as-needed basis.

## 2016-07-27 NOTE — Patient Instructions (Signed)
Good to see you  I am sorry I do not have the magic wand.  Ice is your friend Try the brace with work  If you need a note call me See me again when you need me.

## 2016-08-06 MED FILL — ALPRAZolam 0.5 MG TABS: 0.5 | 8 days supply | Qty: 30 | Fill #0

## 2016-08-07 MED FILL — LISINOPRIL 40 MG TABLET: 40 | 90 days supply | Qty: 90 | Fill #1

## 2016-08-07 MED FILL — HYDROCHLOROTHIAZIDE 25 MG T: 25 | 30 days supply | Qty: 30 | Fill #2

## 2016-08-25 MED FILL — metFORMIN HCL 500 MG TABS: 500 | 30 days supply | Qty: 30 | Fill #4

## 2016-09-04 MED FILL — ALPRAZolam 0.5 MG TABS: 0.5 | 8 days supply | Qty: 30 | Fill #0

## 2016-09-07 MED FILL — CITALOPRAM HBR 40 MG TABLET: 40 | 30 days supply | Qty: 30 | Fill #1

## 2016-09-15 DIAGNOSIS — M9905 Segmental and somatic dysfunction of pelvic region: Secondary | ICD-10-CM | POA: Diagnosis not present

## 2016-09-15 DIAGNOSIS — M9903 Segmental and somatic dysfunction of lumbar region: Secondary | ICD-10-CM | POA: Diagnosis not present

## 2016-09-15 DIAGNOSIS — M545 Low back pain: Secondary | ICD-10-CM | POA: Diagnosis not present

## 2016-09-15 DIAGNOSIS — M9904 Segmental and somatic dysfunction of sacral region: Secondary | ICD-10-CM | POA: Diagnosis not present

## 2016-09-15 MED FILL — HYDROCHLOROTHIAZIDE 25 MG T: 25 | 90 days supply | Qty: 90 | Fill #3

## 2016-09-15 MED FILL — METOPROLOL SUCC ER 100 MG T: 100 | 90 days supply | Qty: 135 | Fill #0

## 2016-09-16 DIAGNOSIS — M9903 Segmental and somatic dysfunction of lumbar region: Secondary | ICD-10-CM | POA: Diagnosis not present

## 2016-09-16 DIAGNOSIS — M9905 Segmental and somatic dysfunction of pelvic region: Secondary | ICD-10-CM | POA: Diagnosis not present

## 2016-09-16 DIAGNOSIS — M9904 Segmental and somatic dysfunction of sacral region: Secondary | ICD-10-CM | POA: Diagnosis not present

## 2016-09-16 DIAGNOSIS — M545 Low back pain: Secondary | ICD-10-CM | POA: Diagnosis not present

## 2016-09-24 MED FILL — metFORMIN HCL 500 MG TABS: 500 | 30 days supply | Qty: 30 | Fill #5

## 2016-10-08 MED FILL — ALPRAZolam 0.5 MG TABS: 0.5 | 8 days supply | Qty: 30 | Fill #0

## 2016-10-08 MED FILL — ZOLPIDEM TARTRATE 10 MG TAB: 10 | 90 days supply | Qty: 45 | Fill #0

## 2016-10-27 MED FILL — CITALOPRAM HBR 40 MG TABLET: 40 | 30 days supply | Qty: 30 | Fill #2

## 2016-10-27 MED FILL — LISINOPRIL 40 MG TABLET: 40 | 90 days supply | Qty: 90 | Fill #2

## 2016-10-27 MED FILL — metFORMIN HCL 500 MG TABS: 500 | 30 days supply | Qty: 30 | Fill #0

## 2016-10-27 MED FILL — AMLODIPINE BESYLATE 2.5 MG: 2.5 | 90 days supply | Qty: 90 | Fill #1

## 2016-11-03 MED FILL — ALPRAZolam 0.5 MG TABS: 0.5 | 8 days supply | Qty: 30 | Fill #0

## 2016-11-16 MED FILL — OSELTAMIVIR PHOS 75 MG CAP: 75 | 5 days supply | Qty: 10 | Fill #0

## 2016-11-30 DIAGNOSIS — M9904 Segmental and somatic dysfunction of sacral region: Secondary | ICD-10-CM | POA: Diagnosis not present

## 2016-11-30 DIAGNOSIS — M9905 Segmental and somatic dysfunction of pelvic region: Secondary | ICD-10-CM | POA: Diagnosis not present

## 2016-11-30 DIAGNOSIS — M545 Low back pain: Secondary | ICD-10-CM | POA: Diagnosis not present

## 2016-11-30 DIAGNOSIS — M9903 Segmental and somatic dysfunction of lumbar region: Secondary | ICD-10-CM | POA: Diagnosis not present

## 2016-11-30 MED FILL — METOPROLOL SUCC ER 100 MG T: 100 | 90 days supply | Qty: 135 | Fill #1

## 2016-11-30 MED FILL — CITALOPRAM HBR 40 MG TABLET: 40 | 30 days supply | Qty: 30 | Fill #3

## 2016-11-30 MED FILL — metFORMIN HCL 500 MG TABS: 500 | 30 days supply | Qty: 30 | Fill #1

## 2016-12-02 DIAGNOSIS — M9905 Segmental and somatic dysfunction of pelvic region: Secondary | ICD-10-CM | POA: Diagnosis not present

## 2016-12-02 DIAGNOSIS — M9904 Segmental and somatic dysfunction of sacral region: Secondary | ICD-10-CM | POA: Diagnosis not present

## 2016-12-02 DIAGNOSIS — M545 Low back pain: Secondary | ICD-10-CM | POA: Diagnosis not present

## 2016-12-02 DIAGNOSIS — M9903 Segmental and somatic dysfunction of lumbar region: Secondary | ICD-10-CM | POA: Diagnosis not present

## 2016-12-08 MED FILL — ALPRAZolam 0.5 MG TABS: 0.5 | 8 days supply | Qty: 30 | Fill #0

## 2016-12-11 DIAGNOSIS — M9905 Segmental and somatic dysfunction of pelvic region: Secondary | ICD-10-CM | POA: Diagnosis not present

## 2016-12-11 DIAGNOSIS — M545 Low back pain: Secondary | ICD-10-CM | POA: Diagnosis not present

## 2016-12-11 DIAGNOSIS — M9903 Segmental and somatic dysfunction of lumbar region: Secondary | ICD-10-CM | POA: Diagnosis not present

## 2016-12-11 DIAGNOSIS — M9904 Segmental and somatic dysfunction of sacral region: Secondary | ICD-10-CM | POA: Diagnosis not present

## 2016-12-31 MED FILL — metFORMIN HCL 500 MG TABS: 500 | 30 days supply | Qty: 30 | Fill #2

## 2017-01-08 MED FILL — ALPRAZolam 0.5 MG TABS: 0.5 | 8 days supply | Qty: 30 | Fill #0

## 2017-02-01 ENCOUNTER — Encounter: Payer: Self-pay | Admitting: Family Medicine

## 2017-02-01 ENCOUNTER — Ambulatory Visit: Payer: Self-pay

## 2017-02-01 ENCOUNTER — Ambulatory Visit (INDEPENDENT_AMBULATORY_CARE_PROVIDER_SITE_OTHER): Payer: 59 | Admitting: Family Medicine

## 2017-02-01 VITALS — BP 120/82 | HR 66 | Ht 62.5 in

## 2017-02-01 DIAGNOSIS — M25532 Pain in left wrist: Secondary | ICD-10-CM

## 2017-02-01 DIAGNOSIS — M19049 Primary osteoarthritis, unspecified hand: Secondary | ICD-10-CM

## 2017-02-01 MED FILL — metFORMIN HCL 500 MG TABS: 500 | 30 days supply | Qty: 30 | Fill #3

## 2017-02-01 NOTE — Patient Instructions (Addendum)
Good to see you as always.  You know the drill  See me again in 4 weeks if not better

## 2017-02-01 NOTE — Assessment & Plan Note (Addendum)
Worsening symptoms. was given an injection today. Tolerated the procedure well. Discussed icing regimen and home exercises. Discussed which activities to do a which ones to avoid. Patient and will come back and see me again on an as-needed basis.

## 2017-02-01 NOTE — Progress Notes (Signed)
[name] [place], [place] [address] Phone: [phone] Subjective:    CC: left wrist pain follow-up  [id number]  Rhonda [name] is a 61 y.o. female coming in with complaint of left wrist pain. Patient has been seen previously for more of a de Quervain's tenosynovitis as well as the [place] arthritis. Patient has done well with conservative therapy previously but has needed injections. Last injection was greater than 6 months ago. Patient was to wear brace when necessary. Patient states unfortunately that this is the right side. Patient has had a de Quervain's on this side previously. Feels though more like the joint. Having some mild swelling. Worse with holding her grandchild.     Past Medical History:  Diagnosis Date  . Anxiety   . Depression   . Hepatitis    ? B when age 88  . HTN (hypertension) 09/28/2013  . Hypertension   . Trigeminal neuralgia    Past Surgical History:  Procedure Laterality Date  . BRAIN SURGERY  2013   trigeminal neuralgia  . BREAST SURGERY     age 68-benign,  . COLONOSCOPY WITH PROPOFOL N/A 08/01/2013   Procedure: COLONOSCOPY WITH PROPOFOL;  Surgeon: Garlan Fair, MD;  Location: [facility];  Service: Endoscopy;  Laterality: N/A;  . CRANIOTOMY     Social History   Social History  . Marital status: Married    Spouse name: N/A  . Number of children: N/A  . Years of education: N/A   Social History Main Topics  . Smoking status: Never Smoker  . Smokeless tobacco: Never Used  . Alcohol use Yes     Comment: occassionally wine  . Drug use: No  . Sexual activity: Not Asked   Other Topics Concern  . None   Social History Narrative  . None   Allergies  Allergen Reactions  . Amlodipine Swelling  . Lisinopril Cough   Family History  Problem Relation Age of Onset  . Arthritis Mother   . Hyperlipidemia Mother   . Heart disease Mother   . Stroke Mother   . Hypertension Mother   . Diabetes  Mother   . Arthritis Father   . Hyperlipidemia Father   . Heart disease Father   . Stroke Father   . Hypertension Father   . Diabetes Father   . Cancer Maternal Grandmother   . Kidney disease Maternal Grandmother   . Mental illness Maternal Grandmother   . Hypertension Maternal Grandmother   . Heart disease Maternal Grandmother   . Cancer Maternal Grandfather   . Kidney disease Maternal Grandfather   . Mental illness Maternal Grandfather   . Hypertension Maternal Grandfather   . Heart disease Maternal Grandfather   . Cancer Paternal Grandmother   . Kidney disease Paternal Grandmother   . Mental illness Paternal Grandmother   . Hypertension Paternal Grandmother   . Heart disease Paternal Grandmother   . Cancer Paternal Grandfather   . Kidney disease Paternal Grandfather   . Mental illness Paternal Grandfather   . Hypertension Paternal Grandfather   . Heart disease Paternal Grandfather     Past medical history, social, surgical and family history all reviewed in electronic medical record.  No pertanent information unless stated regarding to the chief complaint.   Review of Systems:Review of systems updated and as accurate as of 02/01/17  No headache, visual changes, nausea, vomiting, diarrhea, constipation, dizziness, abdominal pain, skin rash, fevers, chills, night sweats, weight loss, swollen lymph  nodes, body aches, joint swelling, muscle aches, chest pain, shortness of breath, mood changes.   Objective  Blood pressure 120/82, pulse 66, height 5' 2.5" (1.588 m), SpO2 98 %. Systems examined below as of 02/01/17   General: No apparent distress alert and oriented x3 mood and affect normal, dressed appropriately.  HEENT: Pupils equal, extraocular movements intact  Respiratory: Patient's speak in full sentences and does not appear short of breath  Cardiovascular: No lower extremity edema, non tender, no erythema  Skin: Warm dry intact with no signs of infection or rash on  extremities or on axial skeleton.  Abdomen: Soft nontender  Neuro: Cranial nerves II through XII are intact, neurovascularly intact in all extremities with 2+ DTRs and 2+ pulses.  Lymph: No lymphadenopathy of posterior or anterior cervical chain or axillae bilaterally.  Gait normal with good balance and coordination.  MSK:  Non tender with full range of motion and good stability and symmetric strength and tone of shoulders, elbows hip, knee and ankles bilaterally.  Wrist: Right Inspection normal with no visible erythema or swelling. ROM smooth and normal with good flexion and extension and ulnar/radial deviation that is symmetrical with opposite wrist. Palpation is normal over metacarpals, navicular, lunate, and TFCC; tendons without tenderness/ swelling No snuffbox tenderness. No tenderness over Canal of Guyon. Strength 5/5 in all directions without pain. Positive Finkelstein, negative tinel's and phalens. Severely positive grind test and pain over the [facility] joint Negative Watson's test. Contralateral wrist unremarkable.   Procedure: Real-time Ultrasound Guided Injection of right CMC arthritis Device: GE Logiq Q7 Ultrasound guided injection is preferred based studies that show increased duration, increased effect, greater accuracy, decreased procedural pain, increased response rate, and decreased cost with ultrasound guided versus blind injection.  Verbal informed consent obtained.  Time-out conducted.  Noted no overlying erythema, induration, or other signs of local infection.  Skin prepped in a sterile fashion.  Local anesthesia: Topical Ethyl chloride.  With sterile technique and under real time ultrasound guidance:   Completed without difficulty  Pain immediately resolved suggesting accurate placement of the medication.  Advised to call if fevers/chills, erythema, induration, drainage, or persistent bleeding.  Images permanently stored and available for review in the ultrasound unit.   Impression: Technically successful ultrasound guided injection.   Impression and Recommendations:     This case required medical decision making of moderate complexity.      Note: This dictation was prepared with Dragon dictation along with smaller phrase technology. Any transcriptional errors that result from this process are unintentional.

## 2017-02-11 DIAGNOSIS — I10 Essential (primary) hypertension: Secondary | ICD-10-CM | POA: Diagnosis not present

## 2017-02-11 DIAGNOSIS — J45909 Unspecified asthma, uncomplicated: Secondary | ICD-10-CM | POA: Diagnosis not present

## 2017-02-11 DIAGNOSIS — R05 Cough: Secondary | ICD-10-CM | POA: Diagnosis not present

## 2017-02-11 MED FILL — ALPRAZolam 0.5 MG TABS: 0.5 | 8 days supply | Qty: 30 | Fill #0

## 2017-02-11 MED FILL — LOSARTAN POTASSIUM 100 MG T: 100 | 30 days supply | Qty: 30 | Fill #0

## 2017-02-11 MED FILL — DOXYCYCLINE HYCLATE 100 MG: 100 | 10 days supply | Qty: 20 | Fill #0

## 2017-02-11 MED FILL — HYDROCODONE-HOMATROPINE SYR: 5-1.5 | 4 days supply | Qty: 120 | Fill #0

## 2017-02-23 DIAGNOSIS — M546 Pain in thoracic spine: Secondary | ICD-10-CM | POA: Diagnosis not present

## 2017-02-23 DIAGNOSIS — M545 Low back pain: Secondary | ICD-10-CM | POA: Diagnosis not present

## 2017-02-23 DIAGNOSIS — M9902 Segmental and somatic dysfunction of thoracic region: Secondary | ICD-10-CM | POA: Diagnosis not present

## 2017-02-23 DIAGNOSIS — M9903 Segmental and somatic dysfunction of lumbar region: Secondary | ICD-10-CM | POA: Diagnosis not present

## 2017-02-23 MED FILL — CITALOPRAM HBR 40 MG TABLET: 40 | 30 days supply | Qty: 30 | Fill #4

## 2017-02-25 MED FILL — metFORMIN HCL 500 MG TABS: 500 | 30 days supply | Qty: 30 | Fill #4

## 2017-03-01 DIAGNOSIS — M9902 Segmental and somatic dysfunction of thoracic region: Secondary | ICD-10-CM | POA: Diagnosis not present

## 2017-03-01 DIAGNOSIS — M545 Low back pain: Secondary | ICD-10-CM | POA: Diagnosis not present

## 2017-03-01 DIAGNOSIS — M546 Pain in thoracic spine: Secondary | ICD-10-CM | POA: Diagnosis not present

## 2017-03-01 DIAGNOSIS — M9903 Segmental and somatic dysfunction of lumbar region: Secondary | ICD-10-CM | POA: Diagnosis not present

## 2017-03-08 DIAGNOSIS — M545 Low back pain: Secondary | ICD-10-CM | POA: Diagnosis not present

## 2017-03-08 DIAGNOSIS — M546 Pain in thoracic spine: Secondary | ICD-10-CM | POA: Diagnosis not present

## 2017-03-08 DIAGNOSIS — M9902 Segmental and somatic dysfunction of thoracic region: Secondary | ICD-10-CM | POA: Diagnosis not present

## 2017-03-08 DIAGNOSIS — M9903 Segmental and somatic dysfunction of lumbar region: Secondary | ICD-10-CM | POA: Diagnosis not present

## 2017-03-11 MED FILL — LOSARTAN POTASSIUM 100 MG T: 100 | 30 days supply | Qty: 30 | Fill #1

## 2017-03-12 DIAGNOSIS — M9903 Segmental and somatic dysfunction of lumbar region: Secondary | ICD-10-CM | POA: Diagnosis not present

## 2017-03-12 DIAGNOSIS — M546 Pain in thoracic spine: Secondary | ICD-10-CM | POA: Diagnosis not present

## 2017-03-12 DIAGNOSIS — M9902 Segmental and somatic dysfunction of thoracic region: Secondary | ICD-10-CM | POA: Diagnosis not present

## 2017-03-12 DIAGNOSIS — M545 Low back pain: Secondary | ICD-10-CM | POA: Diagnosis not present

## 2017-03-12 MED FILL — ALPRAZolam 0.5 MG TABS: 0.5 | 8 days supply | Qty: 30 | Fill #0

## 2017-03-25 DIAGNOSIS — J45909 Unspecified asthma, uncomplicated: Secondary | ICD-10-CM | POA: Diagnosis not present

## 2017-03-25 DIAGNOSIS — J309 Allergic rhinitis, unspecified: Secondary | ICD-10-CM | POA: Diagnosis not present

## 2017-03-25 MED FILL — MONTELUKAST SOD 10 MG TAB: 10 | 30 days supply | Qty: 30 | Fill #0

## 2017-03-25 MED FILL — metFORMIN HCL 500 MG TABS: 500 | 30 days supply | Qty: 30 | Fill #5

## 2017-03-25 MED FILL — PROAIR HFA 90 MCG INHALER: 108 (90 BAS | 25 days supply | Qty: 9 | Fill #0

## 2017-03-28 ENCOUNTER — Emergency Department (HOSPITAL_BASED_OUTPATIENT_CLINIC_OR_DEPARTMENT_OTHER): Payer: 59

## 2017-03-28 ENCOUNTER — Emergency Department (HOSPITAL_BASED_OUTPATIENT_CLINIC_OR_DEPARTMENT_OTHER)
Admission: EM | Admit: 2017-03-28 | Discharge: 2017-03-28 | Disposition: A | Discharge status: Home / Self Care | Payer: 59 | Attending: Emergency Medicine | Admitting: Emergency Medicine

## 2017-03-28 ENCOUNTER — Encounter (HOSPITAL_BASED_OUTPATIENT_CLINIC_OR_DEPARTMENT_OTHER): Payer: Self-pay | Admitting: Adult Health

## 2017-03-28 DIAGNOSIS — R112 Nausea with vomiting, unspecified: Secondary | ICD-10-CM | POA: Insufficient documentation

## 2017-03-28 DIAGNOSIS — Z7982 Long term (current) use of aspirin: Secondary | ICD-10-CM | POA: Diagnosis not present

## 2017-03-28 DIAGNOSIS — W57XXXA Bitten or stung by nonvenomous insect and other nonvenomous arthropods, initial encounter: Secondary | ICD-10-CM | POA: Diagnosis not present

## 2017-03-28 DIAGNOSIS — R51 Headache: Secondary | ICD-10-CM | POA: Diagnosis not present

## 2017-03-28 DIAGNOSIS — R509 Fever, unspecified: Secondary | ICD-10-CM | POA: Diagnosis not present

## 2017-03-28 DIAGNOSIS — I10 Essential (primary) hypertension: Secondary | ICD-10-CM | POA: Insufficient documentation

## 2017-03-28 DIAGNOSIS — Z79899 Other long term (current) drug therapy: Secondary | ICD-10-CM | POA: Diagnosis not present

## 2017-03-28 DIAGNOSIS — M2550 Pain in unspecified joint: Secondary | ICD-10-CM | POA: Insufficient documentation

## 2017-03-28 DIAGNOSIS — S1096XA Insect bite of unspecified part of neck, initial encounter: Secondary | ICD-10-CM | POA: Diagnosis not present

## 2017-03-28 HISTORY — DX: Encephalitis and encephalomyelitis, unspecified: G04.90

## 2017-03-28 HISTORY — DX: Migraine, unspecified, not intractable, without status migrainosus: G43.909

## 2017-03-28 LAB — CBC WITH DIFFERENTIAL/PLATELET
BASOS ABS: 0 10*3/uL (ref 0.0–0.1)
BASOS PCT: 0 %
EOS ABS: 0 10*3/uL (ref 0.0–0.7)
Eosinophils Relative: 0 %
HEMATOCRIT: 42.4 % (ref 36.0–46.0)
HEMOGLOBIN: 14.4 g/dL (ref 12.0–15.0)
Lymphocytes Relative: 14 %
Lymphs Abs: 0.7 10*3/uL (ref 0.7–4.0)
MCH: 31.6 pg (ref 26.0–34.0)
MCHC: 34 g/dL (ref 30.0–36.0)
MCV: 93.2 fL (ref 78.0–100.0)
MONOS PCT: 8 %
Monocytes Absolute: 0.4 10*3/uL (ref 0.1–1.0)
NEUTROS ABS: 3.8 10*3/uL (ref 1.7–7.7)
NEUTROS PCT: 78 %
PLATELETS: 207 10*3/uL (ref 150–400)
RBC: 4.55 MIL/uL (ref 3.87–5.11)
RDW: 13 % (ref 11.5–15.5)
WBC: 4.8 10*3/uL (ref 4.0–10.5)

## 2017-03-28 LAB — BASIC METABOLIC PANEL
Anion gap: 7 (ref 5–15)
BUN: 19 mg/dL (ref 6–20)
CO2: 31 mmol/L (ref 22–32)
Calcium: 8.5 mg/dL — ABNORMAL LOW (ref 8.9–10.3)
Chloride: 100 mmol/L — ABNORMAL LOW (ref 101–111)
Creatinine, Ser: 0.86 mg/dL (ref 0.44–1.00)
GFR calc Af Amer: >60 mL/min (ref >60)
GLUCOSE: 155 mg/dL — AB (ref 65–99)
POTASSIUM: 3.3 mmol/L — AB (ref 3.5–5.1)
Sodium: 138 mmol/L (ref 135–145)

## 2017-03-28 MED ORDER — POTASSIUM CHLORIDE CRYS ER 20 MEQ PO TBCR
40.0000 meq | EXTENDED_RELEASE_TABLET | Freq: Once | ORAL | Status: AC
  Administered 2017-03-28: 40 meq via ORAL
  Filled 2017-03-28: qty 2

## 2017-03-28 MED ORDER — DIPHENHYDRAMINE HCL 50 MG/ML IJ SOLN
25.0000 mg | Freq: Once | INTRAMUSCULAR | Status: AC
  Administered 2017-03-28: 25 mg via INTRAVENOUS
  Filled 2017-03-28: qty 1

## 2017-03-28 MED ORDER — DOXYCYCLINE HYCLATE 100 MG PO TABS
100.0000 mg | ORAL_TABLET | Freq: Once | ORAL | Status: AC
  Administered 2017-03-28: 100 mg via ORAL
  Filled 2017-03-28: qty 1

## 2017-03-28 MED ORDER — SODIUM CHLORIDE 0.9 % IV BOLUS (SEPSIS)
1000.0000 mL | Freq: Once | INTRAVENOUS | Status: AC
  Administered 2017-03-28: 1000 mL via INTRAVENOUS

## 2017-03-28 MED ORDER — PROMETHAZINE HCL 25 MG/ML IJ SOLN
12.5000 mg | Freq: Once | INTRAMUSCULAR | Status: DC
  Filled 2017-03-28: qty 1

## 2017-03-28 MED ORDER — KETOROLAC TROMETHAMINE 30 MG/ML IJ SOLN
30.0000 mg | Freq: Once | INTRAMUSCULAR | Status: AC
  Administered 2017-03-28: 30 mg via INTRAVENOUS
  Filled 2017-03-28: qty 1

## 2017-03-28 MED ORDER — DOXYCYCLINE HYCLATE 100 MG PO CAPS: 100.0000 mg | ORAL_CAPSULE | Freq: Two times a day (BID) | ORAL | 0 refills | Status: DC

## 2017-03-28 MED ORDER — PROCHLORPERAZINE EDISYLATE 5 MG/ML IJ SOLN
10.0000 mg | Freq: Once | INTRAMUSCULAR | Status: AC
  Administered 2017-03-28: 10 mg via INTRAVENOUS
  Filled 2017-03-28: qty 2

## 2017-03-28 MED ORDER — FENTANYL CITRATE (PF) 100 MCG/2ML IJ SOLN
50.0000 ug | Freq: Once | INTRAMUSCULAR | Status: AC
  Administered 2017-03-28: 50 ug via INTRAVENOUS
  Filled 2017-03-28: qty 2

## 2017-03-28 MED ORDER — ONDANSETRON 4 MG PO TBDP: 4.0000 mg | ORAL_TABLET | Freq: Three times a day (TID) | ORAL | 0 refills | Status: AC | PRN

## 2017-03-28 NOTE — ED Provider Notes (Signed)
[facility] Provider Note   CSN: [id number] Arrival date & time: 03/28/17  1615  By signing my name below, I, Rhonda [name], attest that this documentation has been prepared under the direction and in the presence of Alfonzo Beers, MD. Electronically Signed: Jeanell [name], Scribe. 03/28/2017. 5:15 PM.  History   Chief Complaint Chief Complaint  Patient presents with  . Fever   The history is provided by the patient.  Fever   This is a new problem. The current episode started 12 to 24 hours ago. The problem occurs constantly. The problem has been gradually improving. Her temperature was unmeasured prior to arrival. Associated symptoms include vomiting, headaches (Generalized) and muscle aches. She has tried ibuprofen for the symptoms. The treatment provided mild relief.   HPI Comments: Rhonda [name] is a 61 y.o. female who presents to the Emergency Department complaining of constant moderate subjective fever that started last night. She states she removed a tick this morning and another earlier in the month. She tried phenergan, tramadol, percocet, and ibuprofen PTA with minimal relief. Pt's temperature in the ED today was 98.24F. She reports associated headache (generalized), nausea, vomiting, chills, and arthralgias. Denies any rash or other complaints at this time.  She also c/o generalized headache that is constant and throbbing.  No neck pain  or stiffness.  There are no other associated systemic symptoms, there are no other alleviating or modifying factors.     PCP: Kelton Pillar, MD  Past Medical History:  Diagnosis Date  . Anxiety   . Depression   . Encephalitis   . Hepatitis    ? B when age 61  . HTN (hypertension) 09/28/2013  . Hypertension   . Migraines   . Trigeminal neuralgia     Patient Active Problem List   Diagnosis Date Noted  . [place] arthritis 07/27/2016  . De Quervain's tenosynovitis, right 01/16/2015  . Thoracic back pain 11/15/2014  .  Nonallopathic lesion of thoracic region 11/15/2014  . Nonallopathic lesion of cervical region 11/15/2014  . Nonallopathic lesion-rib cage 11/15/2014  . 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 HTN (hypertension) 09/28/2013  . Chest tightness 09/28/2013  . Blood glucose elevated 09/28/2013  . Lateral epicondylitis of right elbow 08/11/2013    Past Surgical History:  Procedure Laterality Date  . BRAIN SURGERY  2013   trigeminal neuralgia  . BREAST SURGERY     age 11-benign,  . COLONOSCOPY WITH PROPOFOL N/A 08/01/2013   Procedure: COLONOSCOPY WITH PROPOFOL;  Surgeon: Garlan Fair, MD;  Location: [facility];  Service: Endoscopy;  Laterality: N/A;  . CRANIOTOMY      OB History    No data available       Home Medications    Prior to Admission medications   Medication Sig Start Date End Date Taking? Authorizing Provider  ALPRAZolam Duanne Moron) 0.25 MG tablet Take 0.25 mg by mouth at bedtime as needed for sleep.    [provider]  aspirin EC 81 MG tablet Take 81 mg by mouth daily.    [provider]  baclofen (LIORESAL) 10 MG tablet Take 10 mg by mouth 3 (three) times daily.    [provider]  cyclobenzaprine (FLEXERIL) 10 MG tablet Take 1 tablet (10 mg total) by mouth 3 (three) times daily as needed for muscle spasms. 11/15/14   Lyndal Pulley, DO  doxycycline (VIBRAMYCIN) 100 MG capsule Take 1 capsule (100 mg total) by mouth 2 (two) times daily. 03/28/17   Linker,  Jana Half, MD  fish oil-omega-3 fatty acids 1000 MG capsule Take 2 g by mouth daily.    [provider]  Glucosamine 500 MG CAPS Take 1 capsule by mouth daily.    [provider]  hydrochlorothiazide (HYDRODIURIL) 25 MG tablet Take 1 tablet (25 mg total) by mouth daily. 09/28/13   Martinique, Peter M, MD  Ibuprofen-Famotidine 800-26.6 MG TABS Take 1 tablet 3 times daily. 07/27/16   Lyndal Pulley, DO  metFORMIN (GLUCOPHAGE) 500 MG tablet Take 500 mg by mouth daily with  breakfast.    [provider]  metoprolol succinate (TOPROL-XL) 100 MG 24 hr tablet Take 100 mg by mouth every evening. Take with or immediately following a meal.    [provider]  Multiple Vitamin (MULTIVITAMIN WITH MINERALS) TABS tablet Take 1 tablet by mouth daily.    [provider]  ondansetron (ZOFRAN ODT) 4 MG disintegrating tablet Take 1 tablet (4 mg total) by mouth every 8 (eight) hours as needed for nausea or vomiting. 03/28/17   Alfonzo Beers, MD  vitamin E 400 UNIT capsule Take 400 Units by mouth daily.    [provider]    Family History Family History  Problem Relation Age of Onset  . Arthritis Mother   . Hyperlipidemia Mother   . Heart disease Mother   . Stroke Mother   . Hypertension Mother   . Diabetes Mother   . Arthritis Father   . Hyperlipidemia Father   . Heart disease Father   . Stroke Father   . Hypertension Father   . Diabetes Father   . Cancer Maternal Grandmother   . Kidney disease Maternal Grandmother   . Mental illness Maternal Grandmother   . Hypertension Maternal Grandmother   . Heart disease Maternal Grandmother   . Cancer Maternal Grandfather   . Kidney disease Maternal Grandfather   . Mental illness Maternal Grandfather   . Hypertension Maternal Grandfather   . Heart disease Maternal Grandfather   . Cancer Paternal Grandmother   . Kidney disease Paternal Grandmother   . Mental illness Paternal Grandmother   . Hypertension Paternal Grandmother   . Heart disease Paternal Grandmother   . Cancer Paternal Grandfather   . Kidney disease Paternal Grandfather   . Mental illness Paternal Grandfather   . Hypertension Paternal Grandfather   . Heart disease Paternal Grandfather     Social History Social History  Substance Use Topics  . Smoking status: Never Smoker  . Smokeless tobacco: Never Used  . Alcohol use Yes     Comment: occassionally wine     Allergies   Amlodipine and Lisinopril   Review of  Systems Review of Systems  Constitutional: Positive for chills and fever (Subjective).  Gastrointestinal: Positive for nausea and vomiting.  Musculoskeletal: Positive for arthralgias.  Skin: Negative for rash.  Neurological: Positive for headaches (Generalized).  All other systems reviewed and are negative.    Physical Exam Updated Vital Signs BP (!) 175/88 (BP Location: Right Arm)   Pulse (!) 104   Temp 98.3 F (36.8 C) (Oral)   Resp 20   Ht 5' 2.5" (1.588 m)   Wt 193 lb (87.5 kg)   SpO2 100%   BMI 34.74 kg/m  Vitals reviewed Physical Exam Physical Examination: General appearance - alert, well appearing, and in no distress Mental status - alert, oriented to person, place, and time Eyes - pupils equal and reactive, extraocular eye movements intact Mouth - mucous membranes moist, pharynx normal without lesions  Neck- FROM, supple,no sig LAD Chest - clear to auscultation, no wheezes, rales or rhonchi, symmetric air entry Heart - normal rate, regular rhythm, normal S1, S2, no murmurs, rubs, clicks or gallops Abdomen - soft, nontender, nondistended, no masses or organomegaly Back exam - no midline tenderness to palpation, no CVA tenderness Neurological - alert, oriented, normal speech, no cranial nerve defect, strength 5/5 in extremities x 4, sensation intact Musculoskeletal - no joint tenderness, deformity or swelling Extremities - peripheral pulses normal, no pedal edema, no clubbing or cyanosis Skin - normal coloration and turgor, no rashes  ED Treatments / Results  DIAGNOSTIC STUDIES: Oxygen Saturation is 100% on RA, normal by my interpretation.    COORDINATION OF CARE: 5:19 PM- Pt advised of plan for treatment and pt agrees.  Labs (all labs ordered are listed, but only abnormal results are displayed) Labs Reviewed  BASIC METABOLIC PANEL - Abnormal; Notable for the following:       Result Value   Potassium 3.3 (*)    Chloride 100 (*)    Glucose, Bld 155 (*)     Calcium 8.5 (*)    All other components within normal limits  CBC WITH DIFFERENTIAL/PLATELET  ROCKY MTN SPOTTED FVR ABS PNL(IGG+IGM)  B. BURGDORFI ANTIBODIES    EKG  EKG Interpretation None       Radiology No results found.  Procedures Procedures (including critical care time)  Medications Ordered in ED Medications  ketorolac (TORADOL) 30 MG/ML injection 30 mg (30 mg Intravenous Given 03/28/17 1743)  diphenhydrAMINE (BENADRYL) injection 25 mg (25 mg Intravenous Given 03/28/17 1743)  prochlorperazine (COMPAZINE) injection 10 mg (10 mg Intravenous Given 03/28/17 1743)  sodium chloride 0.9 % bolus 1,000 mL (0 mLs Intravenous Stopped 03/28/17 1821)  potassium chloride SA (K-DUR,KLOR-CON) CR tablet 40 mEq (40 mEq Oral Given 03/28/17 1821)  fentaNYL (SUBLIMAZE) injection 50 mcg (50 mcg Intravenous Given 03/28/17 1910)  doxycycline (VIBRA-TABS) tablet 100 mg (100 mg Oral Given 03/28/17 1952)     Initial Impression / Assessment and Plan / ED Course  I have reviewed the triage vital signs and the nursing notes.  Pertinent labs & imaging results that were available during my care of the patient were reviewed by me and considered in my medical decision making (see chart for details).    7:45 PM pt feels much improved and has resolved headache at this time.  Lyme and rmsf titres are pending.  Pt given first dose of doxycycline here.  She is requesting discharge.   Pt presenting with c/o headache, vomiting, body aches, subjective fever after having 2 tick bites this week.  No rash.  Labs reassuring.  Pt treated symptomatically and initially did not improved, head CT was obtained and was reassuring with chronic findings.  However will send lyme and RMSF titres and go ahead and cover with doxycycline.  Pt given first dose in the ED.  Discharged with strict return precautions.  Pt agreeable with plan.  Final Clinical Impressions(s) / ED Diagnoses   Final diagnoses:  Tick bite, initial encounter    Febrile illness    New Prescriptions Discharge Medication List as of 03/28/2017  7:48 PM    START taking these medications   Details  doxycycline (VIBRAMYCIN) 100 MG capsule Take 1 capsule (100 mg total) by mouth 2 (two) times daily., Starting Sun 03/28/2017, Print    ondansetron (ZOFRAN ODT) 4 MG disintegrating tablet Take 1 tablet (4 mg total) by mouth every 8 (eight) hours as needed for nausea  or vomiting., Starting Sun 03/28/2017, Print       I personally performed the services described in this documentation, which was scribed in my presence. The recorded information has been reviewed and is accurate.      Alfonzo Beers, MD 03/31/17 1248

## 2017-03-28 NOTE — ED Notes (Signed)
Photophobia, with h/a, neck pain

## 2017-03-28 NOTE — ED Notes (Signed)
Alert, NAD, calm, interactive, resps e/u, speaking in clear complete sentences, no dyspnea noted, skin W&D, VSS, reports "aches improved, nausea gone", "feel much better, ready to go home" (denies: pain, sob, nausea, dizziness or visual changes). Family at [facility].

## 2017-03-28 NOTE — Discharge Instructions (Signed)
Return to the ED with any concerns including vomiting and not able to keep down liquids, difficulty breathing, confusion, decreased level of alertness/lethargy, seizure activity, decreased level of alertness/lethargy, or any other alarming symptoms

## 2017-03-28 NOTE — ED Notes (Signed)
Given po fluids and instructed to drink

## 2017-03-28 NOTE — ED Triage Notes (Signed)
Presents with onset of not feeling well last night, chills and headache, nausea and fatigue. She reports that she has pulled 2 ticks off in the past week. Endorses vomiting last night, none today. Denies diarrhea. Taking hydrocodone and ibuprofen for pain and chills at home without relief. PT is able to touch chin to chest without difficulty, but endorses headache, sound and light sensitivty and neck pain. Denies rashes. Endorses joint pain.

## 2017-03-29 MED FILL — ONDANSETRON ODT 4 MG TABLET: 4 | 7 days supply | Qty: 20 | Fill #0

## 2017-03-29 MED FILL — DOXYCYCLINE HYCLATE 100 MG: 100 | 14 days supply | Qty: 28 | Fill #0

## 2017-03-30 LAB — B. BURGDORFI ANTIBODIES: B burgdorferi Ab IgG+IgM: <0.91 {ISR} (ref 0.00–0.90)

## 2017-03-31 LAB — ROCKY MTN SPOTTED FVR ABS PNL(IGG+IGM)
RMSF IgG: NEGATIVE
RMSF IgM: 0.21 index (ref 0.00–0.89)

## 2017-04-01 DIAGNOSIS — W57XXXA Bitten or stung by nonvenomous insect and other nonvenomous arthropods, initial encounter: Secondary | ICD-10-CM | POA: Diagnosis not present

## 2017-04-01 DIAGNOSIS — S20479A Other superficial bite of unspecified back wall of thorax, initial encounter: Secondary | ICD-10-CM | POA: Diagnosis not present

## 2017-04-08 MED FILL — LOSARTAN POTASSIUM 100 MG T: 100 | 30 days supply | Qty: 30 | Fill #2

## 2017-04-12 MED FILL — ALPRAZolam 0.5 MG TABS: 0.5 | 8 days supply | Qty: 30 | Fill #0

## 2017-04-26 MED FILL — CITALOPRAM HBR 40 MG TABLET: 40 | 30 days supply | Qty: 30 | Fill #5

## 2017-04-26 MED FILL — metFORMIN HCL 500 MG TABS: 500 | 30 days supply | Qty: 30 | Fill #0

## 2017-05-07 DIAGNOSIS — M545 Low back pain: Secondary | ICD-10-CM | POA: Diagnosis not present

## 2017-05-07 DIAGNOSIS — M9903 Segmental and somatic dysfunction of lumbar region: Secondary | ICD-10-CM | POA: Diagnosis not present

## 2017-05-07 DIAGNOSIS — M9904 Segmental and somatic dysfunction of sacral region: Secondary | ICD-10-CM | POA: Diagnosis not present

## 2017-05-07 DIAGNOSIS — M9905 Segmental and somatic dysfunction of pelvic region: Secondary | ICD-10-CM | POA: Diagnosis not present

## 2017-05-07 DIAGNOSIS — R350 Frequency of micturition: Secondary | ICD-10-CM | POA: Diagnosis not present

## 2017-05-07 MED FILL — CIPROFLOXACIN HCL 500 MG TA: 500 | 3 days supply | Qty: 6 | Fill #0

## 2017-05-11 MED FILL — LOSARTAN POTASSIUM 100 MG T: 100 | 30 days supply | Qty: 30 | Fill #3

## 2017-05-11 MED FILL — ALPRAZolam 0.5 MG TABS: 0.5 | 8 days supply | Qty: 30 | Fill #0

## 2017-05-13 MED FILL — GABAPENTIN 600 MG TABLET: 600 | 90 days supply | Qty: 180 | Fill #0

## 2017-05-18 DIAGNOSIS — G500 Trigeminal neuralgia: Secondary | ICD-10-CM | POA: Diagnosis not present

## 2017-05-18 DIAGNOSIS — G479 Sleep disorder, unspecified: Secondary | ICD-10-CM | POA: Diagnosis not present

## 2017-05-18 DIAGNOSIS — G894 Chronic pain syndrome: Secondary | ICD-10-CM | POA: Diagnosis not present

## 2017-05-18 DIAGNOSIS — I10 Essential (primary) hypertension: Secondary | ICD-10-CM | POA: Diagnosis not present

## 2017-05-18 MED FILL — AMLODIPINE BESYLATE 5 MG TA: 5 | 90 days supply | Qty: 90 | Fill #0

## 2017-05-18 MED FILL — OXYCODONE W/APAP 5/325 TAB: 5-325 | 4 days supply | Qty: 30 | Fill #0

## 2017-05-20 DIAGNOSIS — L03031 Cellulitis of right toe: Secondary | ICD-10-CM | POA: Diagnosis not present

## 2017-05-22 DIAGNOSIS — L03031 Cellulitis of right toe: Secondary | ICD-10-CM | POA: Diagnosis not present

## 2017-05-24 DIAGNOSIS — L03031 Cellulitis of right toe: Secondary | ICD-10-CM | POA: Diagnosis not present

## 2017-05-26 MED FILL — CITALOPRAM HBR 40 MG TABLET: 40 | 30 days supply | Qty: 30 | Fill #6

## 2017-05-26 MED FILL — metFORMIN HCL 500 MG TABS: 500 | 30 days supply | Qty: 30 | Fill #1

## 2017-06-03 ENCOUNTER — Ambulatory Visit: Payer: 59 | Admitting: Neurology

## 2017-06-03 ENCOUNTER — Ambulatory Visit (INDEPENDENT_AMBULATORY_CARE_PROVIDER_SITE_OTHER): Payer: 59 | Admitting: Neurology

## 2017-06-03 ENCOUNTER — Encounter: Payer: Self-pay | Admitting: Neurology

## 2017-06-03 VITALS — BP 132/83 | HR 65 | Ht 62.5 in | Wt 199.0 lb

## 2017-06-03 DIAGNOSIS — Z5181 Encounter for therapeutic drug level monitoring: Secondary | ICD-10-CM | POA: Diagnosis not present

## 2017-06-03 DIAGNOSIS — G500 Trigeminal neuralgia: Secondary | ICD-10-CM | POA: Diagnosis not present

## 2017-06-03 MED ORDER — LEVETIRACETAM 500 MG PO TABS: 500.0000 mg | ORAL_TABLET | Freq: Two times a day (BID) | ORAL | 3 refills | Status: DC

## 2017-06-03 NOTE — Patient Instructions (Signed)
   Begin Keppra 500 mg taking 1/2 tablet twice a day for 1 week, then take one tablet twice a day. We will get MRI of the brain.

## 2017-06-03 NOTE — Progress Notes (Signed)
Reason for visit: Trigeminal neuralgia  Referring physician: Dr. Bradly Chris is a 61 y.o. female  History of present illness:  Rhonda [name] is a 61 year old right-handed white female with a history of diabetes and a history of trigeminal neuralgia. The patient claims that she had onset of symptoms around 2006, she was seen by Dr. love. The patient underwent MRI of the brain in 2007, she has not had any MRI studies since that time. The patient was treated with a multitude of medications in the past including gabapentin, carbamazepine, Trileptal, Effexor, and baclofen. These medications were not effective. The patient was sent to [facility] and underwent gamma knife therapy on the left that did reduce the pain, but left her with a burning sensation in the left side of the tongue and a numbness in the V2 and V3 regions on the left face. The patient indicates that these sensations are tolerable at this time. The patient did undergo decompressive surgery for some increased pain on the left in 2014 which seemed to help. The patient then developed trigeminal neuralgia pain on the right side in the V3 distribution primarily. The patient underwent decompressive surgery on 10/16/2015, this was done by Dr. Salomon Fick at [facility]. This did help the pain for about a year and half, but the pain has now started to return, the patient has had daily episodes of sharp jabbing pain on the right face that may occur with talking, chewing, or swallowing. The patient denies any numbness or weakness of the extremities, she denies any significant balance problems, but on gabapentin she does make her feel somewhat staggery. The patient denies issues controlling the bowels or the bladder. The patient does have oxycodone to take if needed, she generally has not been taking this medication. She is sent to this office for further evaluation.  Past Medical History:  Diagnosis Date  . Anxiety   . Depression   . Encephalitis   .  Hepatitis    ? B when age 44  . HTN (hypertension) 09/28/2013  . Hypertension   . Migraines   . Trigeminal neuralgia     Past Surgical History:  Procedure Laterality Date  . BRAIN SURGERY  2013   trigeminal neuralgia  . BREAST SURGERY     age 9-benign,  . COLONOSCOPY WITH PROPOFOL N/A 08/01/2013   Procedure: COLONOSCOPY WITH PROPOFOL;  Surgeon: Garlan Fair, MD;  Location: [facility];  Service: Endoscopy;  Laterality: N/A;  . CRANIOTOMY    . gamma knife     x1    Family History  Problem Relation Age of Onset  . Arthritis Mother   . Hyperlipidemia Mother   . Heart disease Mother   . Stroke Mother   . Hypertension Mother   . Diabetes Mother   . Arthritis Father   . Hyperlipidemia Father   . Heart disease Father   . Stroke Father   . Hypertension Father   . Diabetes Father   . Cancer Maternal Grandmother   . Kidney disease Maternal Grandmother   . Mental illness Maternal Grandmother   . Hypertension Maternal Grandmother   . Heart disease Maternal Grandmother   . Cancer Maternal Grandfather   . Kidney disease Maternal Grandfather   . Mental illness Maternal Grandfather   . Hypertension Maternal Grandfather   . Heart disease Maternal Grandfather   . Cancer Paternal Grandmother   . Kidney disease Paternal Grandmother   . Mental illness Paternal Grandmother   . Hypertension  Paternal Grandmother   . Heart disease Paternal Grandmother   . Cancer Paternal Grandfather   . Kidney disease Paternal Grandfather   . Mental illness Paternal Grandfather   . Hypertension Paternal Grandfather   . Heart disease Paternal Grandfather     Social history:  reports that she has never smoked. She has never used smokeless tobacco. She reports that she drinks alcohol. She reports that she does not use drugs.  Medications:  Prior to Admission medications   Medication Sig Start Date End Date Taking? Authorizing Provider  ALPRAZolam Duanne Moron) 0.5 MG tablet Take 0.5 mg by mouth every  6 (six) hours as needed. 05/11/17  Yes [provider]  amLODipine (NORVASC) 5 MG tablet Take 5 mg by mouth daily. 05/18/17  Yes [provider]  aspirin EC 81 MG tablet Take 81 mg by mouth daily.   Yes [provider]  baclofen (LIORESAL) 10 MG tablet Take 10 mg by mouth 3 (three) times daily.   Yes [provider]  citalopram (CELEXA) 40 MG tablet Take 40 mg by mouth daily. 05/26/17  Yes [provider]  cyclobenzaprine (FLEXERIL) 10 MG tablet Take 1 tablet (10 mg total) by mouth 3 (three) times daily as needed for muscle spasms. 11/15/14  Yes Lyndal Pulley, DO  fish oil-omega-3 fatty acids 1000 MG capsule Take 2 g by mouth daily.   Yes [provider]  gabapentin (NEURONTIN) 600 MG tablet Take 600 mg by mouth 2 (two) times daily. 05/13/17  Yes [provider]  Glucosamine 500 MG CAPS Take 1 capsule by mouth daily.   Yes [provider]  hydrochlorothiazide (HYDRODIURIL) 25 MG tablet Take 1 tablet (25 mg total) by mouth daily. 09/28/13  Yes Martinique, Peter M, MD  Ibuprofen-Famotidine 800-26.6 MG TABS Take 1 tablet 3 times daily. 07/27/16  Yes Lyndal Pulley, DO  losartan (COZAAR) 100 MG tablet Take 100 mg by mouth daily. 05/11/17  Yes [provider]  metFORMIN (GLUCOPHAGE) 500 MG tablet Take 500 mg by mouth daily with breakfast.   Yes [provider]  metoprolol succinate (TOPROL-XL) 100 MG 24 hr tablet Take 100 mg by mouth every evening. Take with or immediately following a meal.   Yes [provider]  montelukast (SINGULAIR) 10 MG tablet TAKE 1 TABLET BY MOUTH ONCE DAILY EVERY EVENING 03/25/17  Yes [provider]  Multiple Vitamin (MULTIVITAMIN WITH MINERALS) TABS tablet Take 1 tablet by mouth daily.   Yes [provider]  ondansetron (ZOFRAN ODT) 4 MG disintegrating tablet Take 1 tablet (4 mg total) by mouth every 8 (eight) hours as needed for nausea or vomiting. 03/28/17  Yes Mabe,  Forbes Cellar, MD  oxyCODONE-acetaminophen (PERCOCET/ROXICET) 5-325 MG tablet Take 1 tablet by mouth every 6 (six) hours as needed. for pain 05/18/17  Yes [provider]  PROAIR HFA 108 (90 Base) MCG/ACT inhaler Inhale 2 puffs into the lungs every 6 (six) hours as needed. 03/25/17  Yes [provider]  vitamin E 400 UNIT capsule Take 400 Units by mouth daily.   Yes [provider]      Allergies  Allergen Reactions  . Amlodipine Swelling  . Lisinopril Cough    ROS:  Out of a complete 14 system review of symptoms, the patient complains only of the following symptoms, and all other reviewed systems are negative.  Not enough sleep Right facial pain  Blood pressure 132/83, pulse 65, height 5' 2.5" (1.588 m), weight 199 lb (90.3 kg).  Physical Exam  General: The patient is alert and cooperative at the time of the examination. The patient is moderately to markedly obese.  Eyes: Pupils are equal, round, and reactive to light. Discs are flat bilaterally.  Neck: The neck is supple, no carotid bruits are noted.  Respiratory: The respiratory examination is clear.  Cardiovascular: The cardiovascular examination reveals a regular rate and rhythm, no obvious murmurs or rubs are noted.  Skin: Extremities are without significant edema.  Neurologic Exam  Mental status: The patient is alert and oriented x 3 at the time of the examination. The patient has apparent normal recent and remote memory, with an apparently normal attention span and concentration ability.  Cranial nerves: Facial symmetry is present. There is good sensation of the face to pinprick and soft touch on the right, decreased soft touch and pinprick sensation in the V2 and V3 distributions on the left. The strength of the facial muscles and the muscles to head turning and shoulder shrug are normal bilaterally. Speech is well enunciated, no aphasia or dysarthria is noted. Extraocular movements are full.  Visual fields are full. The tongue is midline, and the patient has symmetric elevation of the soft palate. No obvious hearing deficits are noted.  Motor: The motor testing reveals 5 over 5 strength of all 4 extremities. Good symmetric motor tone is noted throughout.  Sensory: Sensory testing is intact to pinprick, soft touch, vibration sensation, and position sense on all 4 extremities. No evidence of extinction is noted.  Coordination: Cerebellar testing reveals good finger-nose-finger and heel-to-shin bilaterally.  Gait and station: Gait is normal. Tandem gait is normal. Romberg is negative. No drift is seen.  Reflexes: Deep tendon reflexes are symmetric, but are depressed bilaterally. Toes are downgoing bilaterally.   CT head 03/28/17:  IMPRESSION: No acute intracranial process.  LEFT retrosigmoid craniotomy, otherwise negative CT HEAD for age.  * CT scan images were reviewed online. I agree with the written report.    Assessment/Plan:  1. Trigeminal neuralgia, bilateral  2. Diabetes  The history of bilateral trigeminal neuralgia somewhat unusual. The patient has not had a MRI of the brain since 2007. We will repeat the study to exclude demyelinating disease as a possible etiology for her symptoms. The patient will undergo blood work to document renal function prior to the MRI. The patient will be placed on Keppra in low dose working up to 500 mg twice daily. She will remain on the gabapentin. She will follow-up in 5 or 6 months, I will call her with results of the above study. If the pain on the right side significant worsens, the gamma knife procedure can be done on this side.   Jill Alexanders MD 06/03/2017 9:34 AM  [facility] [address] [place], [place] [address]  Phone [phone] Fax [phone]

## 2017-06-04 ENCOUNTER — Telehealth: Payer: Self-pay | Admitting: Neurology

## 2017-06-04 LAB — COMPREHENSIVE METABOLIC PANEL
A/G RATIO: 1.7 (ref 1.2–2.2)
ALBUMIN: 4.4 g/dL (ref 3.6–4.8)
ALT: 71 IU/L — ABNORMAL HIGH (ref 0–32)
AST: 45 IU/L — AB (ref 0–40)
Alkaline Phosphatase: 75 IU/L (ref 39–117)
BUN / CREAT RATIO: 20 (ref 12–28)
BUN: 17 mg/dL (ref 8–27)
Bilirubin Total: 0.2 mg/dL (ref 0.0–1.2)
CALCIUM: 9.9 mg/dL (ref 8.7–10.3)
CO2: 25 mmol/L (ref 20–29)
CREATININE: 0.84 mg/dL (ref 0.57–1.00)
Chloride: 99 mmol/L (ref 96–106)
GFR, EST AFRICAN AMERICAN: 86 mL/min/{1.73_m2} (ref >59)
GFR, EST NON AFRICAN AMERICAN: 75 mL/min/{1.73_m2} (ref >59)
GLOBULIN, TOTAL: 2.6 g/dL (ref 1.5–4.5)
Glucose: 117 mg/dL — ABNORMAL HIGH (ref 65–99)
POTASSIUM: 5.1 mmol/L (ref 3.5–5.2)
SODIUM: 139 mmol/L (ref 134–144)
TOTAL PROTEIN: 7 g/dL (ref 6.0–8.5)

## 2017-06-04 NOTE — Telephone Encounter (Signed)
I called the patient. the blood work shows a normal renal function, liver enzymes are slightly elevated. I have sent the blood work results to the primary care physician. This will need to be followed.

## 2017-06-10 MED FILL — levETIRAcetam 500 MG TABS: 500 | 30 days supply | Qty: 60 | Fill #0

## 2017-06-10 MED FILL — ALPRAZolam 0.5 MG TABS: 0.5 | 8 days supply | Qty: 30 | Fill #0

## 2017-06-11 MED FILL — LOSARTAN POTASSIUM 100 MG T: 100 | 30 days supply | Qty: 30 | Fill #4

## 2017-06-15 DIAGNOSIS — H5213 Myopia, bilateral: Secondary | ICD-10-CM | POA: Diagnosis not present

## 2017-06-15 DIAGNOSIS — H52223 Regular astigmatism, bilateral: Secondary | ICD-10-CM | POA: Diagnosis not present

## 2017-06-15 DIAGNOSIS — M545 Low back pain: Secondary | ICD-10-CM | POA: Diagnosis not present

## 2017-06-15 DIAGNOSIS — M9904 Segmental and somatic dysfunction of sacral region: Secondary | ICD-10-CM | POA: Diagnosis not present

## 2017-06-15 DIAGNOSIS — H524 Presbyopia: Secondary | ICD-10-CM | POA: Diagnosis not present

## 2017-06-15 DIAGNOSIS — M9905 Segmental and somatic dysfunction of pelvic region: Secondary | ICD-10-CM | POA: Diagnosis not present

## 2017-06-15 DIAGNOSIS — M9903 Segmental and somatic dysfunction of lumbar region: Secondary | ICD-10-CM | POA: Diagnosis not present

## 2017-06-17 ENCOUNTER — Telehealth: Payer: Self-pay | Admitting: *Deleted

## 2017-06-17 ENCOUNTER — Ambulatory Visit: Payer: Self-pay

## 2017-06-17 ENCOUNTER — Ambulatory Visit (INDEPENDENT_AMBULATORY_CARE_PROVIDER_SITE_OTHER): Payer: 59 | Admitting: Family Medicine

## 2017-06-17 ENCOUNTER — Encounter: Payer: Self-pay | Admitting: Family Medicine

## 2017-06-17 VITALS — BP 108/76 | HR 75 | Ht 62.5 in

## 2017-06-17 DIAGNOSIS — M654 Radial styloid tenosynovitis [de Quervain]: Secondary | ICD-10-CM | POA: Diagnosis not present

## 2017-06-17 DIAGNOSIS — M25531 Pain in right wrist: Secondary | ICD-10-CM

## 2017-06-17 MED ORDER — PREDNISONE 50 MG PO TABS: 50.0000 mg | ORAL_TABLET | Freq: Every day | ORAL | 0 refills | Status: DC

## 2017-06-17 MED FILL — predniSONE 50 MG TABS: 50 | 5 days supply | Qty: 5 | Fill #0

## 2017-06-17 NOTE — Assessment & Plan Note (Signed)
Worsening symptoms again. Has responded very well to injections previously. Hopefully he'll continue do well. Continue the bracing the icing regimen. Patient will continue to be active. Follow-up with me again in 4-6 weeks if necessary.

## 2017-06-17 NOTE — Progress Notes (Signed)
[name] [place],  [address] Phone: [phone] Subjective:    I'm seeing this patient by the request  of:    CC: Right wrist pain.  VVO:[id number]  Rhonda [name] is a 61 y.o. female coming in with complaint of right wrist pain. Patient has had a coincidence and about as before. Worsening symptoms again. Has been 11 months since we have injected her. Starting have worsening symptoms and not responding to the brace for the home exercises. Affecting daily activities as well as work.    Past Medical History:  Diagnosis Date  . Anxiety   . Depression   . Encephalitis   . Hepatitis    ? B when age 45  . HTN (hypertension) 09/28/2013  . Hypertension   . Migraines   . Trigeminal neuralgia    Past Surgical History:  Procedure Laterality Date  . BRAIN SURGERY  2013   trigeminal neuralgia  . BREAST SURGERY     age 75-benign,  . COLONOSCOPY WITH PROPOFOL N/A 08/01/2013   Procedure: COLONOSCOPY WITH PROPOFOL;  Surgeon: Garlan Fair, MD;  Location: [facility];  Service: Endoscopy;  Laterality: N/A;  . CRANIOTOMY    . gamma knife     x1   Social History   Social History  . Marital status: Married    Spouse name: N/A  . Number of children: N/A  . Years of education: N/A   Occupational History  . Nurse    Social History Main Topics  . Smoking status: Never Smoker  . Smokeless tobacco: Never Used  . Alcohol use Yes     Comment: occassionally wine  . Drug use: No  . Sexual activity: Not on file   Other Topics Concern  . Not on file   Social History Narrative   Lives with husband, Richardson Landry   Caffeine use: 2 cups coffee per day   Right handed    Allergies  Allergen Reactions  . Amlodipine Swelling  . Lisinopril Cough   Family History  Problem Relation Age of Onset  . Arthritis Mother   . Hyperlipidemia Mother   . Heart disease Mother   . Stroke Mother   . Hypertension Mother   . Diabetes Mother   .  Arthritis Father   . Hyperlipidemia Father   . Heart disease Father   . Stroke Father   . Hypertension Father   . Diabetes Father   . Cancer Maternal Grandmother   . Kidney disease Maternal Grandmother   . Mental illness Maternal Grandmother   . Hypertension Maternal Grandmother   . Heart disease Maternal Grandmother   . Cancer Maternal Grandfather   . Kidney disease Maternal Grandfather   . Mental illness Maternal Grandfather   . Hypertension Maternal Grandfather   . Heart disease Maternal Grandfather   . Cancer Paternal Grandmother   . Kidney disease Paternal Grandmother   . Mental illness Paternal Grandmother   . Hypertension Paternal Grandmother   . Heart disease Paternal Grandmother   . Cancer Paternal Grandfather   . Kidney disease Paternal Grandfather   . Mental illness Paternal Grandfather   . Hypertension Paternal Grandfather   . Heart disease Paternal Grandfather      Past medical history, social, surgical and family history all reviewed in electronic medical record.  No pertanent information unless stated regarding to the chief complaint.   Review of Systems:Review of systems updated and as accurate as of 06/17/17  No headache,  visual changes, nausea, vomiting, diarrhea, constipation, dizziness, abdominal pain, skin rash, fevers, chills, night sweats, weight loss, swollen lymph nodes, body aches, joint swelling, muscle aches, chest pain, shortness of breath, mood changes.   Objective  There were no vitals taken for this visit. Systems examined below as of 06/17/17   General: No apparent distress alert and oriented x3 mood and affect normal, dressed appropriately.  HEENT: Pupils equal, extraocular movements intact  Respiratory: Patient's speak in full sentences and does not appear short of breath  Cardiovascular: No lower extremity edema, non tender, no erythema  Skin: Warm dry intact with no signs of infection or rash on extremities or on axial skeleton.    Abdomen: Soft nontender  Neuro: Cranial nerves II through XII are intact, neurovascularly intact in all extremities with 2+ DTRs and 2+ pulses.  Lymph: No lymphadenopathy of posterior or anterior cervical chain or axillae bilaterally.  Gait normal with good balance and coordination.  MSK:  Non tender with full range of motion and good stability and symmetric strength and tone of shoulders, elbows,  hip, knee and ankles bilaterally.  Wrist: Right Inspection normal with no visible erythema or swelling. ROM smooth and normal with good flexion and extension and ulnar/radial deviation that is symmetrical with opposite wrist. Palpation is normal over metacarpals, navicular, lunate, and TFCC; tendons without tenderness/ swelling No snuffbox tenderness. No tenderness over Canal of Guyon. Strength 5/5 in all directions without pain. Positive Finkelstein, negative tinel's and phalens. Negative Watson's test.  Procedure: Real-time Ultrasound Guided Injection of right Abductor pollicis longs tendon sheath Device: GE Logiq E  Ultrasound guided injection is preferred based studies that show increased duration, increased effect, greater accuracy, decreased procedural pain, increased response rate with ultrasound guided versus blind injection.  Verbal informed consent obtained.  Time-out conducted.  Noted no overlying erythema, induration, or other signs of local infection.  Skin prepped in a sterile fashion.  Local anesthesia: Topical Ethyl chloride.  With sterile technique and under real time ultrasound guidance:  tendon visualized.  23g 5/8 inch needle inserted distal to proximal approach into tendon sheath. Pictures taken  for needle placement. Patient did have injection of 0.5 cc of 0.5% Marcaine, and 0.5 cc of Kenalog 40 mg/dL. Completed without difficulty  Pain immediately resolved suggesting accurate placement of the medication.  Advised to call if fevers/chills, erythema, induration, drainage,  or persistent bleeding.  Images permanently stored and available for review in the ultrasound unit.  Impression: Technically successful ultrasound guided injection.    Impression and Recommendations:     This case required medical decision making of moderate complexity.      Note: This dictation was prepared with Dragon dictation along with smaller phrase technology. Any transcriptional errors that result from this process are unintentional.

## 2017-06-17 NOTE — Patient Instructions (Addendum)
Good to see you  You are great  Wear brace at night Prednisone daily for 5 days  Stay active Wear brace on wrist day and night for 2 days and nightly for 2 weeks See me when you need me or if hip does not improve in next 3-4 weeks

## 2017-06-17 NOTE — Telephone Encounter (Signed)
Error

## 2017-06-22 ENCOUNTER — Ambulatory Visit
Admission: RE | Admit: 2017-06-22 | Discharge: 2017-06-22 | Disposition: A | Discharge status: Home / Self Care | Payer: 59 | Source: Ambulatory Visit | Attending: Neurology | Admitting: Neurology

## 2017-06-22 DIAGNOSIS — G500 Trigeminal neuralgia: Secondary | ICD-10-CM

## 2017-06-22 DIAGNOSIS — R51 Headache: Secondary | ICD-10-CM | POA: Diagnosis not present

## 2017-06-22 MED ORDER — GADOBENATE DIMEGLUMINE 529 MG/ML IV SOLN
18.0000 mL | Freq: Once | INTRAVENOUS | Status: AC | PRN
  Administered 2017-06-22: 18 mL via INTRAVENOUS

## 2017-06-24 ENCOUNTER — Telehealth: Payer: Self-pay | Admitting: Neurology

## 2017-06-24 NOTE — Telephone Encounter (Signed)
I called the patient. The MRI of the brain is essentially normal, a few there is small punctate lesion seen, no evidence of demyelinating disease. No lesions on the trigeminal nerve. The patient will call for any medication dose adjustments.  MRI brain 06/24/17:  IMPRESSION:  Abnormal MRI brain (with and without) demonstrating: 1. Few punctate scattered periventricular and subcortical and pontine foci of non-specific T2 hyperintensities. 2. No abnormal lesions are seen on post contrast views.   3. No acute findings. 4. Compared to MRI on 06/03/06, a few more T2 hyperintensities are noted in the current study; this could be related to differences in magnetic field strengths.

## 2017-06-25 DIAGNOSIS — Z0000 Encounter for general adult medical examination without abnormal findings: Secondary | ICD-10-CM | POA: Diagnosis not present

## 2017-06-25 DIAGNOSIS — G500 Trigeminal neuralgia: Secondary | ICD-10-CM | POA: Diagnosis not present

## 2017-06-25 DIAGNOSIS — G2581 Restless legs syndrome: Secondary | ICD-10-CM | POA: Diagnosis not present

## 2017-06-25 DIAGNOSIS — J309 Allergic rhinitis, unspecified: Secondary | ICD-10-CM | POA: Diagnosis not present

## 2017-06-25 DIAGNOSIS — E7800 Pure hypercholesterolemia, unspecified: Secondary | ICD-10-CM | POA: Diagnosis not present

## 2017-06-25 DIAGNOSIS — G43909 Migraine, unspecified, not intractable, without status migrainosus: Secondary | ICD-10-CM | POA: Diagnosis not present

## 2017-06-25 DIAGNOSIS — J45909 Unspecified asthma, uncomplicated: Secondary | ICD-10-CM | POA: Diagnosis not present

## 2017-06-25 DIAGNOSIS — Z7984 Long term (current) use of oral hypoglycemic drugs: Secondary | ICD-10-CM | POA: Diagnosis not present

## 2017-06-25 DIAGNOSIS — I10 Essential (primary) hypertension: Secondary | ICD-10-CM | POA: Diagnosis not present

## 2017-06-25 DIAGNOSIS — E11319 Type 2 diabetes mellitus with unspecified diabetic retinopathy without macular edema: Secondary | ICD-10-CM | POA: Diagnosis not present

## 2017-06-28 MED FILL — metFORMIN HCL 500 MG TABS: 500 | 30 days supply | Qty: 30 | Fill #2

## 2017-06-28 MED FILL — CITALOPRAM HBR 40 MG TABLET: 40 | 30 days supply | Qty: 30 | Fill #7

## 2017-07-13 MED FILL — LOSARTAN POTASSIUM 100 MG T: 100 | 30 days supply | Qty: 30 | Fill #5

## 2017-07-15 ENCOUNTER — Ambulatory Visit: Payer: 59 | Admitting: Neurology

## 2017-07-15 DIAGNOSIS — J069 Acute upper respiratory infection, unspecified: Secondary | ICD-10-CM | POA: Diagnosis not present

## 2017-07-15 DIAGNOSIS — R062 Wheezing: Secondary | ICD-10-CM | POA: Diagnosis not present

## 2017-07-15 MED FILL — VENTOLIN HFA 90 MCG INHALER: 108 (90 BAS | 25 days supply | Qty: 18 | Fill #0

## 2017-07-17 DIAGNOSIS — J208 Acute bronchitis due to other specified organisms: Secondary | ICD-10-CM | POA: Diagnosis not present

## 2017-07-28 MED FILL — metFORMIN HCL 500 MG TABS: 500 | 30 days supply | Qty: 30 | Fill #3

## 2017-07-28 MED FILL — METOPROLOL SUCC ER 100 MG T: 100 | 90 days supply | Qty: 135 | Fill #2

## 2017-08-03 MED FILL — HYDROCHLOROTHIAZIDE 25 MG T: 25 | 90 days supply | Qty: 90 | Fill #0

## 2017-08-11 MED FILL — LOSARTAN POTASSIUM 100 MG T: 100 | 90 days supply | Qty: 90 | Fill #6

## 2017-08-19 MED FILL — ALPRAZolam 0.5 MG TABS: 0.5 | 7 days supply | Qty: 30 | Fill #0

## 2017-08-26 MED FILL — metFORMIN HCL 500 MG TABS: 500 | 30 days supply | Qty: 30 | Fill #4

## 2017-09-15 DIAGNOSIS — H2513 Age-related nuclear cataract, bilateral: Secondary | ICD-10-CM | POA: Diagnosis not present

## 2017-09-15 DIAGNOSIS — H35033 Hypertensive retinopathy, bilateral: Secondary | ICD-10-CM | POA: Diagnosis not present

## 2017-09-15 DIAGNOSIS — E119 Type 2 diabetes mellitus without complications: Secondary | ICD-10-CM | POA: Diagnosis not present

## 2017-09-20 MED FILL — ALPRAZolam 0.5 MG TABS: 0.5 | 7 days supply | Qty: 30 | Fill #0

## 2017-09-22 MED FILL — metFORMIN HCL 500 MG TABS: 500 | 30 days supply | Qty: 30 | Fill #5

## 2017-10-08 MED FILL — CITALOPRAM HBR 40 MG TABLET: 40 | 30 days supply | Qty: 30 | Fill #0

## 2017-10-19 DIAGNOSIS — M546 Pain in thoracic spine: Secondary | ICD-10-CM | POA: Diagnosis not present

## 2017-10-19 DIAGNOSIS — M62838 Other muscle spasm: Secondary | ICD-10-CM | POA: Diagnosis not present

## 2017-10-19 DIAGNOSIS — M9902 Segmental and somatic dysfunction of thoracic region: Secondary | ICD-10-CM | POA: Diagnosis not present

## 2017-10-20 DIAGNOSIS — M546 Pain in thoracic spine: Secondary | ICD-10-CM | POA: Diagnosis not present

## 2017-10-20 DIAGNOSIS — M62838 Other muscle spasm: Secondary | ICD-10-CM | POA: Diagnosis not present

## 2017-10-20 DIAGNOSIS — M9902 Segmental and somatic dysfunction of thoracic region: Secondary | ICD-10-CM | POA: Diagnosis not present

## 2017-10-22 DIAGNOSIS — M546 Pain in thoracic spine: Secondary | ICD-10-CM | POA: Diagnosis not present

## 2017-10-22 DIAGNOSIS — M9902 Segmental and somatic dysfunction of thoracic region: Secondary | ICD-10-CM | POA: Diagnosis not present

## 2017-10-22 DIAGNOSIS — M62838 Other muscle spasm: Secondary | ICD-10-CM | POA: Diagnosis not present

## 2017-10-22 MED FILL — ALPRAZolam 0.5 MG TABS: 0.5 | 8 days supply | Qty: 30 | Fill #0

## 2017-10-22 MED FILL — metFORMIN HCL 500 MG TABS: 500 | 30 days supply | Qty: 30 | Fill #0

## 2017-10-26 NOTE — Progress Notes (Signed)
[name] [place], [place] [address] Phone: [phone] Subjective:     CC: Right wrist pain  VVO:[id number]  Rhonda [name] is a 61 y.o. female coming in with complaint of right wrist pain patient has had recurrent abductor pollicis longus tendinitis.  Having worsening symptoms again.  Seems worse when working multiple days in a row.  Patient has to do a lot of holding abnormal positioning.  Patient states some swelling.  Waking her up at night, affecting daily activities even dressing.      Past Medical History:  Diagnosis Date  . Anxiety   . Depression   . Encephalitis   . Hepatitis    ? B when age 70  . HTN (hypertension) 09/28/2013  . Hypertension   . Migraines   . Trigeminal neuralgia    Past Surgical History:  Procedure Laterality Date  . BRAIN SURGERY  2013   trigeminal neuralgia  . BREAST SURGERY     age 40-benign,  . COLONOSCOPY WITH PROPOFOL N/A 08/01/2013   Procedure: COLONOSCOPY WITH PROPOFOL;  Surgeon: Garlan Fair, MD;  Location: [facility];  Service: Endoscopy;  Laterality: N/A;  . CRANIOTOMY    . gamma knife     x1   Social History   Socioeconomic History  . Marital status: Married    Spouse name: Not on file  . Number of children: Not on file  . Years of education: Not on file  . Highest education level: Not on file  Social Needs  . Financial resource strain: Not on file  . Food insecurity - worry: Not on file  . Food insecurity - inability: Not on file  . Transportation needs - medical: Not on file  . Transportation needs - non-medical: Not on file  Occupational History  . Occupation: Nurse  Tobacco Use  . Smoking status: Never Smoker  . Smokeless tobacco: Never Used  Substance and Sexual Activity  . Alcohol use: Yes    Comment: occassionally wine  . Drug use: No  . Sexual activity: Not on file  Other Topics Concern  . Not on file  Social History Narrative   Lives with husband, Richardson Landry     Caffeine use: 2 cups coffee per day   Right handed    Allergies  Allergen Reactions  . Amlodipine Swelling  . Lisinopril Cough   Family History  Problem Relation Age of Onset  . Arthritis Mother   . Hyperlipidemia Mother   . Heart disease Mother   . Stroke Mother   . Hypertension Mother   . Diabetes Mother   . Arthritis Father   . Hyperlipidemia Father   . Heart disease Father   . Stroke Father   . Hypertension Father   . Diabetes Father   . Cancer Maternal Grandmother   . Kidney disease Maternal Grandmother   . Mental illness Maternal Grandmother   . Hypertension Maternal Grandmother   . Heart disease Maternal Grandmother   . Cancer Maternal Grandfather   . Kidney disease Maternal Grandfather   . Mental illness Maternal Grandfather   . Hypertension Maternal Grandfather   . Heart disease Maternal Grandfather   . Cancer Paternal Grandmother   . Kidney disease Paternal Grandmother   . Mental illness Paternal Grandmother   . Hypertension Paternal Grandmother   . Heart disease Paternal Grandmother   . Cancer Paternal Grandfather   . Kidney disease Paternal Grandfather   . Mental illness Paternal Grandfather   .  Hypertension Paternal Grandfather   . Heart disease Paternal Grandfather      Past medical history, social, surgical and family history all reviewed in electronic medical record.  No pertanent information unless stated regarding to the chief complaint.   Review of Systems:Review of systems updated and as accurate as of 10/27/17  No headache, visual changes, nausea, vomiting, diarrhea, constipation, dizziness, abdominal pain, skin rash, fevers, chills, night sweats, weight loss, swollen lymph nodes, body aches, joint swelling, muscle aches, chest pain, shortness of breath, mood changes.   Objective  Blood pressure (!) 123/45, height 5\' 2"  (1.575 m). Systems examined below as of 10/27/17   General: No apparent distress alert and oriented x3 mood and affect  normal, dressed appropriately.  HEENT: Pupils equal, extraocular movements intact  Respiratory: Patient's speak in full sentences and does not appear short of breath  Cardiovascular: No lower extremity edema, non tender, no erythema  Skin: Warm dry intact with no signs of infection or rash on extremities or on axial skeleton.  Abdomen: Soft nontender  Neuro: Cranial nerves II through XII are intact, neurovascularly intact in all extremities with 2+ DTRs and 2+ pulses.  Lymph: No lymphadenopathy of posterior or anterior cervical chain or axillae bilaterally.  Gait normal with good balance and coordination.  MSK:  Non tender with full range of motion and good stability and symmetric strength and tone of shoulders, elbows, hip, knee and ankles bilaterally.  Wrist: Right Inspection normal with no visible erythema or swelling. ROM smooth and normal with good flexion and extension and ulnar/radial deviation that is symmetrical with opposite wrist. Palpation is normal over metacarpals, navicular, lunate, and TFCC; tendons without tenderness/ swelling No snuffbox tenderness. No tenderness over Canal of Guyon. Strength 5/5 in all directions without pain. Positive Finkelstein, negative Tinel's and phalens.  Mild positive grind Negative Watson's test. Contralateral wrist unremarkable   Procedure: Real-time Ultrasound Guided Injection of right Abductor pollicis longs tendon sheath Device: GE Logiq E  Ultrasound guided injection is preferred based studies that show increased duration, increased effect, greater accuracy, decreased procedural pain, increased response rate with ultrasound guided versus blind injection.  Verbal informed consent obtained.  Time-out conducted.  Noted no overlying erythema, induration, or other signs of local infection.  Skin prepped in a sterile fashion.  Local anesthesia: Topical Ethyl chloride.  With sterile technique and under real time ultrasound guidance:  tendon  visualized.  23g 5/8 inch needle inserted distal to proximal approach into tendon sheath. Pictures taken  for needle placement. Patient did have injection of 0.5 cc of 0.5% Marcaine, and 0.5 cc of Kenalog 40 mg/dL. Completed without difficulty  Pain immediately resolved suggesting accurate placement of the medication.  Advised to call if fevers/chills, erythema, induration, drainage, or persistent bleeding.  Images permanently stored and available for review in the ultrasound unit.  Impression: Technically successful ultrasound guided injection.    Impression and Recommendations:     This case required medical decision making of moderate complexity.      Note: This dictation was prepared with Dragon dictation along with smaller phrase technology. Any transcriptional errors that result from this process are unintentional.

## 2017-10-27 ENCOUNTER — Ambulatory Visit: Payer: Self-pay

## 2017-10-27 ENCOUNTER — Ambulatory Visit: Payer: 59 | Admitting: Family Medicine

## 2017-10-27 VITALS — BP 123/45 | Ht 62.0 in

## 2017-10-27 DIAGNOSIS — M25531 Pain in right wrist: Secondary | ICD-10-CM | POA: Diagnosis not present

## 2017-10-27 DIAGNOSIS — M19049 Primary osteoarthritis, unspecified hand: Secondary | ICD-10-CM

## 2017-10-27 DIAGNOSIS — M9902 Segmental and somatic dysfunction of thoracic region: Secondary | ICD-10-CM | POA: Diagnosis not present

## 2017-10-27 DIAGNOSIS — M62838 Other muscle spasm: Secondary | ICD-10-CM | POA: Diagnosis not present

## 2017-10-27 DIAGNOSIS — M654 Radial styloid tenosynovitis [de Quervain]: Secondary | ICD-10-CM | POA: Diagnosis not present

## 2017-10-27 DIAGNOSIS — M546 Pain in thoracic spine: Secondary | ICD-10-CM | POA: Diagnosis not present

## 2017-10-27 NOTE — Assessment & Plan Note (Signed)
Stable.       - Continue to monitor

## 2017-10-27 NOTE — Assessment & Plan Note (Signed)
Worsening symptoms again.  Patient has now had 2 within this calendar year.  We discussed the possibility of PRP injections.  Patient will consider this as well.  We topical anti-inflammatories.  Follow-up again 4 weeks

## 2017-11-11 MED FILL — LOSARTAN POTASSIUM 100 MG T: 100 | 90 days supply | Qty: 90 | Fill #7

## 2017-11-11 MED FILL — ALPRAZolam 0.5 MG TABS: 0.5 | 8 days supply | Qty: 30 | Fill #0

## 2017-11-11 MED FILL — HYDROCHLOROTHIAZIDE 25 MG T: 25 | 90 days supply | Qty: 90 | Fill #1

## 2017-11-11 MED FILL — CITALOPRAM HBR 40 MG TABLET: 40 | 30 days supply | Qty: 30 | Fill #0

## 2017-11-29 MED FILL — metFORMIN HCL 500 MG TABS: 500 | 30 days supply | Qty: 30 | Fill #1

## 2017-12-03 DIAGNOSIS — M546 Pain in thoracic spine: Secondary | ICD-10-CM | POA: Diagnosis not present

## 2017-12-03 DIAGNOSIS — M9902 Segmental and somatic dysfunction of thoracic region: Secondary | ICD-10-CM | POA: Diagnosis not present

## 2017-12-03 DIAGNOSIS — M62838 Other muscle spasm: Secondary | ICD-10-CM | POA: Diagnosis not present

## 2017-12-13 MED FILL — ALPRAZolam 0.5 MG TABS: 0.5 | 8 days supply | Qty: 30 | Fill #0

## 2017-12-14 MED FILL — METOPROLOL SUCCINATE ER 100: 100 | 90 days supply | Qty: 135 | Fill #0

## 2017-12-22 DIAGNOSIS — N3090 Cystitis, unspecified without hematuria: Secondary | ICD-10-CM | POA: Diagnosis not present

## 2017-12-22 DIAGNOSIS — I10 Essential (primary) hypertension: Secondary | ICD-10-CM | POA: Diagnosis not present

## 2017-12-22 DIAGNOSIS — R350 Frequency of micturition: Secondary | ICD-10-CM | POA: Diagnosis not present

## 2017-12-22 MED FILL — NITROFURANTOIN MONO-MCR 100: 100 | 30 days supply | Qty: 30 | Fill #0

## 2017-12-22 MED FILL — OLMESARTAN-HCTZ 40-25 MG TA: 40-25 | 90 days supply | Qty: 90 | Fill #0

## 2017-12-22 MED FILL — CIPROFLOXACIN HCL 250 MG TA: 250 | 7 days supply | Qty: 14 | Fill #0

## 2017-12-24 MED FILL — CITALOPRAM HBR 40 MG TABLET: 40 | 30 days supply | Qty: 30 | Fill #1

## 2017-12-30 DIAGNOSIS — M9902 Segmental and somatic dysfunction of thoracic region: Secondary | ICD-10-CM | POA: Diagnosis not present

## 2017-12-30 DIAGNOSIS — M546 Pain in thoracic spine: Secondary | ICD-10-CM | POA: Diagnosis not present

## 2017-12-30 DIAGNOSIS — M545 Low back pain: Secondary | ICD-10-CM | POA: Diagnosis not present

## 2017-12-30 DIAGNOSIS — M9903 Segmental and somatic dysfunction of lumbar region: Secondary | ICD-10-CM | POA: Diagnosis not present

## 2017-12-31 MED FILL — metFORMIN HCL 500 MG TABS: 500 | 30 days supply | Qty: 30 | Fill #2

## 2018-01-13 MED FILL — FLUCONAZOLE 150 MG TABLET: 150 | 2 days supply | Qty: 2 | Fill #0

## 2018-01-18 MED FILL — ALPRAZolam 0.5 MG TABS: 0.5 | 8 days supply | Qty: 30 | Fill #0

## 2018-01-19 MED FILL — metFORMIN HCL 500 MG TABS: 500 | 30 days supply | Qty: 30 | Fill #3

## 2018-01-24 DIAGNOSIS — B356 Tinea cruris: Secondary | ICD-10-CM | POA: Diagnosis not present

## 2018-01-24 DIAGNOSIS — R300 Dysuria: Secondary | ICD-10-CM | POA: Diagnosis not present

## 2018-01-24 MED FILL — NYSTATIN 100,000 UNIT/GM PO: 100000 | 10 days supply | Qty: 60 | Fill #0

## 2018-02-03 DIAGNOSIS — R51 Headache: Secondary | ICD-10-CM | POA: Diagnosis not present

## 2018-02-03 DIAGNOSIS — B349 Viral infection, unspecified: Secondary | ICD-10-CM | POA: Diagnosis not present

## 2018-02-08 DIAGNOSIS — L293 Anogenital pruritus, unspecified: Secondary | ICD-10-CM | POA: Diagnosis not present

## 2018-02-08 DIAGNOSIS — B372 Candidiasis of skin and nail: Secondary | ICD-10-CM | POA: Diagnosis not present

## 2018-02-08 DIAGNOSIS — N760 Acute vaginitis: Secondary | ICD-10-CM | POA: Diagnosis not present

## 2018-02-08 MED FILL — TRIAMCINOLONE 0.1% CREAM: 0.1 | 7 days supply | Qty: 30 | Fill #0

## 2018-02-08 MED FILL — NYAMYC 100,000 UNITS/GM PWD: 100000 | 7 days supply | Qty: 30 | Fill #0

## 2018-02-08 MED FILL — metroNIDAZOLE 0.75 % GEL: 0.75 | 5 days supply | Qty: 70 | Fill #0

## 2018-02-14 MED FILL — ALPRAZolam 0.5 MG TABS: 0.5 | 8 days supply | Qty: 30 | Fill #0

## 2018-02-21 DIAGNOSIS — Z01419 Encounter for gynecological examination (general) (routine) without abnormal findings: Secondary | ICD-10-CM | POA: Diagnosis not present

## 2018-02-21 DIAGNOSIS — Z0000 Encounter for general adult medical examination without abnormal findings: Secondary | ICD-10-CM | POA: Diagnosis not present

## 2018-02-21 DIAGNOSIS — Z130 Encounter for screening for diseases of the blood and blood-forming organs and certain disorders involving the immune mechanism: Secondary | ICD-10-CM | POA: Diagnosis not present

## 2018-02-21 DIAGNOSIS — Z1389 Encounter for screening for other disorder: Secondary | ICD-10-CM | POA: Diagnosis not present

## 2018-02-21 DIAGNOSIS — Z124 Encounter for screening for malignant neoplasm of cervix: Secondary | ICD-10-CM | POA: Diagnosis not present

## 2018-02-21 DIAGNOSIS — B372 Candidiasis of skin and nail: Secondary | ICD-10-CM | POA: Diagnosis not present

## 2018-02-21 DIAGNOSIS — Z6837 Body mass index (BMI) 37.0-37.9, adult: Secondary | ICD-10-CM | POA: Diagnosis not present

## 2018-02-21 DIAGNOSIS — Z1231 Encounter for screening mammogram for malignant neoplasm of breast: Secondary | ICD-10-CM | POA: Diagnosis not present

## 2018-02-21 MED FILL — FLUCONAZOLE 150 MG TABLET: 150 | 10 days supply | Qty: 10 | Fill #0

## 2018-02-22 DIAGNOSIS — Z124 Encounter for screening for malignant neoplasm of cervix: Secondary | ICD-10-CM | POA: Diagnosis not present

## 2018-02-22 DIAGNOSIS — Z1151 Encounter for screening for human papillomavirus (HPV): Secondary | ICD-10-CM | POA: Diagnosis not present

## 2018-02-22 DIAGNOSIS — Z0000 Encounter for general adult medical examination without abnormal findings: Secondary | ICD-10-CM | POA: Diagnosis not present

## 2018-02-23 MED FILL — metFORMIN HCL 500 MG TABS: 500 | 30 days supply | Qty: 60 | Fill #0

## 2018-02-24 MED FILL — CITALOPRAM HBR 40 MG TABLET: 40 | 30 days supply | Qty: 30 | Fill #2

## 2018-02-28 ENCOUNTER — Other Ambulatory Visit: Payer: Self-pay | Admitting: Obstetrics and Gynecology

## 2018-02-28 DIAGNOSIS — R928 Other abnormal and inconclusive findings on diagnostic imaging of breast: Secondary | ICD-10-CM

## 2018-03-03 ENCOUNTER — Other Ambulatory Visit: Payer: 59

## 2018-03-03 DIAGNOSIS — M545 Low back pain: Secondary | ICD-10-CM | POA: Diagnosis not present

## 2018-03-03 DIAGNOSIS — M9904 Segmental and somatic dysfunction of sacral region: Secondary | ICD-10-CM | POA: Diagnosis not present

## 2018-03-03 DIAGNOSIS — M9905 Segmental and somatic dysfunction of pelvic region: Secondary | ICD-10-CM | POA: Diagnosis not present

## 2018-03-03 DIAGNOSIS — M9903 Segmental and somatic dysfunction of lumbar region: Secondary | ICD-10-CM | POA: Diagnosis not present

## 2018-03-08 ENCOUNTER — Ambulatory Visit
Admission: RE | Admit: 2018-03-08 | Discharge: 2018-03-08 | Disposition: A | Discharge status: Home / Self Care | Payer: 59 | Source: Ambulatory Visit | Attending: Obstetrics and Gynecology | Admitting: Obstetrics and Gynecology

## 2018-03-08 ENCOUNTER — Other Ambulatory Visit: Payer: Self-pay | Admitting: Obstetrics and Gynecology

## 2018-03-08 DIAGNOSIS — R928 Other abnormal and inconclusive findings on diagnostic imaging of breast: Secondary | ICD-10-CM

## 2018-03-08 DIAGNOSIS — N6320 Unspecified lump in the left breast, unspecified quadrant: Secondary | ICD-10-CM

## 2018-03-08 DIAGNOSIS — N6324 Unspecified lump in the left breast, lower inner quadrant: Secondary | ICD-10-CM | POA: Diagnosis not present

## 2018-03-11 MED FILL — ALPRAZolam 0.5 MG TABS: 0.5 | 8 days supply | Qty: 30 | Fill #0

## 2018-03-11 MED FILL — OLMESARTAN-HCTZ 40-25 MG TA: 40-25 | 90 days supply | Qty: 90 | Fill #1

## 2018-04-13 MED FILL — ALPRAZolam 0.5 MG TABS: 0.5 | 8 days supply | Qty: 30 | Fill #0

## 2018-04-14 MED FILL — metFORMIN HCL 500 MG TABS: 500 | 30 days supply | Qty: 60 | Fill #0

## 2018-04-25 DIAGNOSIS — M545 Low back pain: Secondary | ICD-10-CM | POA: Diagnosis not present

## 2018-04-25 DIAGNOSIS — M9903 Segmental and somatic dysfunction of lumbar region: Secondary | ICD-10-CM | POA: Diagnosis not present

## 2018-04-25 DIAGNOSIS — M9904 Segmental and somatic dysfunction of sacral region: Secondary | ICD-10-CM | POA: Diagnosis not present

## 2018-04-25 DIAGNOSIS — M9905 Segmental and somatic dysfunction of pelvic region: Secondary | ICD-10-CM | POA: Diagnosis not present

## 2018-04-27 MED FILL — METOPROLOL SUCCINATE ER 100: 100 | 90 days supply | Qty: 135 | Fill #0

## 2018-04-27 MED FILL — CITALOPRAM HBR 40 MG TABLET: 40 | 30 days supply | Qty: 30 | Fill #3

## 2018-05-10 DIAGNOSIS — M545 Low back pain: Secondary | ICD-10-CM | POA: Diagnosis not present

## 2018-05-10 DIAGNOSIS — M9903 Segmental and somatic dysfunction of lumbar region: Secondary | ICD-10-CM | POA: Diagnosis not present

## 2018-05-10 DIAGNOSIS — M9904 Segmental and somatic dysfunction of sacral region: Secondary | ICD-10-CM | POA: Diagnosis not present

## 2018-05-10 DIAGNOSIS — M9905 Segmental and somatic dysfunction of pelvic region: Secondary | ICD-10-CM | POA: Diagnosis not present

## 2018-05-16 ENCOUNTER — Telehealth: Payer: Self-pay

## 2018-05-16 MED FILL — metFORMIN HCL 500 MG TABS: 500 | 30 days supply | Qty: 60 | Fill #1

## 2018-05-16 MED FILL — ALPRAZolam 0.5 MG TABS: 0.5 | 30 days supply | Qty: 30 | Fill #0

## 2018-05-16 NOTE — Telephone Encounter (Signed)
Left message to call back to schedule.

## 2018-05-16 NOTE — Telephone Encounter (Signed)
Copied from [facility] [id number]. Topic: General - Other >> May 13, 2018  3:58 PM Judyann Munson wrote: Reason for CRM: patient is wanting to request a appt with Hulan Saas, DO for her right wrist. The days that she is  available is  July 15,16 and 18th. Please advise.

## 2018-05-25 NOTE — Progress Notes (Signed)
[name] [address], [place] [address] Phone: [phone] Subjective:     CC: bilateral wrist pain   OMB:[id number]  Rhonda [name] is a 62 y.o. female coming in with complaint of wrist and thumb pain. She did use a brace for a while before getting in to see [place]. Using the brace did improve her pain but she does not wear the brace during the work day. Right is worse than left. Has had injection that did help this issue previously.     Past Medical History:  Diagnosis Date  . Anxiety   . Depression   . Encephalitis   . Hepatitis    ? B when age 48  . HTN (hypertension) 09/28/2013  . Hypertension   . Migraines   . Trigeminal neuralgia    Past Surgical History:  Procedure Laterality Date  . BRAIN SURGERY  2013   trigeminal neuralgia  . BREAST SURGERY     age 69-benign,  . COLONOSCOPY WITH PROPOFOL N/A 08/01/2013   Procedure: COLONOSCOPY WITH PROPOFOL;  Surgeon: Garlan Fair, MD;  Location: [facility];  Service: Endoscopy;  Laterality: N/A;  . CRANIOTOMY    . gamma knife     x1   Social History   Socioeconomic History  . Marital status: Married    Spouse name: Not on file  . Number of children: Not on file  . Years of education: Not on file  . Highest education level: Not on file  Occupational History  . Occupation: Nurse  Social Needs  . Financial resource strain: Not on file  . Food insecurity:    Worry: Not on file    Inability: Not on file  . Transportation needs:    Medical: Not on file    Non-medical: Not on file  Tobacco Use  . Smoking status: Never Smoker  . Smokeless tobacco: Never Used  Substance and Sexual Activity  . Alcohol use: Yes    Comment: occassionally wine  . Drug use: No  . Sexual activity: Not on file  Lifestyle  . Physical activity:    Days per week: Not on file    Minutes per session: Not on file  . Stress: Not on file  Relationships  . Social connections:    Talks on phone: Not on  file    Gets together: Not on file    Attends religious service: Not on file    Active member of club or organization: Not on file    Attends meetings of clubs or organizations: Not on file    Relationship status: Not on file  Other Topics Concern  . Not on file  Social History Narrative   Lives with husband, Richardson Landry   Caffeine use: 2 cups coffee per day   Right handed    Allergies  Allergen Reactions  . Amlodipine Swelling  . Lisinopril Cough   Family History  Problem Relation Age of Onset  . Arthritis Mother   . Hyperlipidemia Mother   . Heart disease Mother   . Stroke Mother   . Hypertension Mother   . Diabetes Mother   . Arthritis Father   . Hyperlipidemia Father   . Heart disease Father   . Stroke Father   . Hypertension Father   . Diabetes Father   . Cancer Maternal Grandmother   . Kidney disease Maternal Grandmother   . Mental illness Maternal Grandmother   . Hypertension Maternal Grandmother   . Heart  disease Maternal Grandmother   . Cancer Maternal Grandfather   . Kidney disease Maternal Grandfather   . Mental illness Maternal Grandfather   . Hypertension Maternal Grandfather   . Heart disease Maternal Grandfather   . Cancer Paternal Grandmother   . Kidney disease Paternal Grandmother   . Mental illness Paternal Grandmother   . Hypertension Paternal Grandmother   . Heart disease Paternal Grandmother   . Cancer Paternal Grandfather   . Kidney disease Paternal Grandfather   . Mental illness Paternal Grandfather   . Hypertension Paternal Grandfather   . Heart disease Paternal Grandfather      Past medical history, social, surgical and family history all reviewed in electronic medical record.  No pertanent information unless stated regarding to the chief complaint.   Review of Systems:Review of systems updated and as accurate as of 05/26/18  No headache, visual changes, nausea, vomiting, diarrhea, constipation, dizziness, abdominal pain, skin rash, fevers,  chills, night sweats, weight loss, swollen lymph nodes, body aches, joint swelling, muscle aches, chest pain, shortness of breath, mood changes.   Objective  Blood pressure (!) 142/84, pulse 63, height 5\' 2"  (1.575 m), SpO2 98 %. Systems examined below as of 05/26/18   General: No apparent distress alert and oriented x3 mood and affect normal, dressed appropriately.  HEENT: Pupils equal, extraocular movements intact  Respiratory: Patient's speak in full sentences and does not appear short of breath  Cardiovascular: No lower extremity edema, non tender, no erythema  Skin: Warm dry intact with no signs of infection or rash on extremities or on axial skeleton.  Abdomen: Soft nontender  Neuro: Cranial nerves II through XII are intact, neurovascularly intact in all extremities with 2+ DTRs and 2+ pulses.  Lymph: No lymphadenopathy of posterior or anterior cervical chain or axillae bilaterally.  Gait normal with good balance and coordination.  MSK:  Non tender with full range of motion and good stability and symmetric strength and tone of shoulders, elbows, hip, knee and ankles bilaterally.  Wrist: Bilateral Inspection normal with no visible erythema or swelling. ROM smooth and normal with good flexion and extension and ulnar/radial deviation that is symmetrical with opposite wrist. Palpation is normal over metacarpals, navicular, lunate, and TFCC; tendons without tenderness/ swelling No snuffbox tenderness. No tenderness over Canal of Guyon. Strength 5/5 in all directions without pain. Positive Finkelstein, negative Tinel's and phalens. Negative Watson's test.  Procedure: Real-time Ultrasound Guided Injection of right Abductor pollicis longs tendon sheath Device: GE Logiq E  Ultrasound guided injection is preferred based studies that show increased duration, increased effect, greater accuracy, decreased procedural pain, increased response rate with ultrasound guided versus blind injection.    Verbal informed consent obtained.  Time-out conducted.  Noted no overlying erythema, induration, or other signs of local infection.  Skin prepped in a sterile fashion.  Local anesthesia: Topical Ethyl chloride.  With sterile technique and under real time ultrasound guidance:  tendon visualized.  23g 5/8 inch needle inserted distal to proximal approach into tendon sheath. Pictures taken  for needle placement. Patient did have injection of 0.5 cc of 0.5% Marcaine, and 0.5 cc of Kenalog 40 mg/dL. Completed without difficulty  Pain immediately resolved suggesting accurate placement of the medication.  Advised to call if fevers/chills, erythema, induration, drainage, or persistent bleeding.  Images permanently stored and available for review in the ultrasound unit.  Impression: Technically successful ultrasound guided injection.  Procedure: Real-time Ultrasound Guided Injection of left abductor pollicis longs tendon sheath Device: GE Logiq E  Ultrasound guided injection is preferred based studies that show increased duration, increased effect, greater accuracy, decreased procedural pain, increased response rate with ultrasound guided versus blind injection.  Verbal informed consent obtained.  Time-out conducted.  Noted no overlying erythema, induration, or other signs of local infection.  Skin prepped in a sterile fashion.  Local anesthesia: Topical Ethyl chloride.  With sterile technique and under real time ultrasound guidance:  tendon visualized.  23g 5/8 inch needle inserted distal to proximal approach into tendon sheath. Pictures taken  for needle placement. Patient did have injection of 0.5 cc of 0.5% Marcaine, and 0.5 cc of Kenalog 40 mg/dL. Completed without difficulty  Pain immediately resolved suggesting accurate placement of the medication.  Advised to call if fevers/chills, erythema, induration, drainage, or persistent bleeding.  Images permanently stored and available for review in  the ultrasound unit.  Impression: Technically successful ultrasound guided injection.    Impression and Recommendations:     This case required medical decision making of moderate complexity.      Note: This dictation was prepared with Dragon dictation along with smaller phrase technology. Any transcriptional errors that result from this process are unintentional.

## 2018-05-26 ENCOUNTER — Ambulatory Visit: Payer: 59 | Admitting: Family Medicine

## 2018-05-26 ENCOUNTER — Ambulatory Visit: Payer: Self-pay

## 2018-05-26 VITALS — BP 142/84 | HR 63 | Ht 62.0 in

## 2018-05-26 DIAGNOSIS — M79644 Pain in right finger(s): Secondary | ICD-10-CM

## 2018-05-26 DIAGNOSIS — M654 Radial styloid tenosynovitis [de Quervain]: Secondary | ICD-10-CM | POA: Diagnosis not present

## 2018-05-26 DIAGNOSIS — M79645 Pain in left finger(s): Principal | ICD-10-CM

## 2018-05-26 NOTE — Assessment & Plan Note (Signed)
Bilateral injections given today.  Discussed bracing, topical anti-inflammatories.  Follow-up again in 10 weeks.

## 2018-05-26 NOTE — Patient Instructions (Addendum)
Good to see you  You know the drill  Ice is your friend.  As long as you do well see me when you need me  See me again in 2 weeks then we will take care of the knees.

## 2018-06-14 NOTE — Progress Notes (Signed)
[name] [address] [place], [place] [address] Phone: [phone] Subjective:    I'm seeing this patient by the request  of:    CC: Bilateral knee pain  IEP:[id number]  Rhonda [name] is a 62 y.o. female coming in with complaint of knee pain. Still has some pain in the knee. Here for knee injections.  Patient has had knee arthritis performed.  Has not had injections for some time.  Since then seemed to be worsening.  Starting to give her some instability.    Past Medical History:  Diagnosis Date  . Anxiety   . Depression   . Encephalitis   . Hepatitis    ? B when age 33  . HTN (hypertension) 09/28/2013  . Hypertension   . Migraines   . Trigeminal neuralgia    Past Surgical History:  Procedure Laterality Date  . BRAIN SURGERY  2013   trigeminal neuralgia  . BREAST SURGERY     age 52-benign,  . COLONOSCOPY WITH PROPOFOL N/A 08/01/2013   Procedure: COLONOSCOPY WITH PROPOFOL;  Surgeon: Garlan Fair, MD;  Location: [facility];  Service: Endoscopy;  Laterality: N/A;  . CRANIOTOMY    . gamma knife     x1   Social History   Socioeconomic History  . Marital status: Married    Spouse name: Not on file  . Number of children: Not on file  . Years of education: Not on file  . Highest education level: Not on file  Occupational History  . Occupation: Nurse  Social Needs  . Financial resource strain: Not on file  . Food insecurity:    Worry: Not on file    Inability: Not on file  . Transportation needs:    Medical: Not on file    Non-medical: Not on file  Tobacco Use  . Smoking status: Never Smoker  . Smokeless tobacco: Never Used  Substance and Sexual Activity  . Alcohol use: Yes    Comment: occassionally wine  . Drug use: No  . Sexual activity: Not on file  Lifestyle  . Physical activity:    Days per week: Not on file    Minutes per session: Not on file  . Stress: Not on file  Relationships  . Social connections:    Talks  on phone: Not on file    Gets together: Not on file    Attends religious service: Not on file    Active member of club or organization: Not on file    Attends meetings of clubs or organizations: Not on file    Relationship status: Not on file  Other Topics Concern  . Not on file  Social History Narrative   Lives with husband, Richardson Landry   Caffeine use: 2 cups coffee per day   Right handed    Allergies  Allergen Reactions  . Amlodipine Swelling  . Lisinopril Cough   Family History  Problem Relation Age of Onset  . Arthritis Mother   . Hyperlipidemia Mother   . Heart disease Mother   . Stroke Mother   . Hypertension Mother   . Diabetes Mother   . Arthritis Father   . Hyperlipidemia Father   . Heart disease Father   . Stroke Father   . Hypertension Father   . Diabetes Father   . Cancer Maternal Grandmother   . Kidney disease Maternal Grandmother   . Mental illness Maternal Grandmother   . Hypertension Maternal Grandmother   .  Heart disease Maternal Grandmother   . Cancer Maternal Grandfather   . Kidney disease Maternal Grandfather   . Mental illness Maternal Grandfather   . Hypertension Maternal Grandfather   . Heart disease Maternal Grandfather   . Cancer Paternal Grandmother   . Kidney disease Paternal Grandmother   . Mental illness Paternal Grandmother   . Hypertension Paternal Grandmother   . Heart disease Paternal Grandmother   . Cancer Paternal Grandfather   . Kidney disease Paternal Grandfather   . Mental illness Paternal Grandfather   . Hypertension Paternal Grandfather   . Heart disease Paternal Grandfather      Past medical history, social, surgical and family history all reviewed in electronic medical record.  No pertanent information unless stated regarding to the chief complaint.   Review of Systems:Review of systems updated and as accurate as of 06/14/18  No headache, visual changes, nausea, vomiting, diarrhea, constipation, dizziness, abdominal pain,  skin rash, fevers, chills, night sweats, weight loss, swollen lymph nodes, body aches, joint swelling,, chest pain, shortness of breath, mood changes.  Positive muscle aches  Objective  There were no vitals taken for this visit. Systems examined below as of 06/14/18   General: No apparent distress alert and oriented x3 mood and affect normal, dressed appropriately.  HEENT: Pupils equal, extraocular movements intact  Respiratory: Patient's speak in full sentences and does not appear short of breath  Cardiovascular: No lower extremity edema, non tender, no erythema  Skin: Warm dry intact with no signs of infection or rash on extremities or on axial skeleton.  Abdomen: Soft nontender  Neuro: Cranial nerves II through XII are intact, neurovascularly intact in all extremities with 2+ DTRs and 2+ pulses.  Lymph: No lymphadenopathy of posterior or anterior cervical chain or axillae bilaterally.  Gait normal with good balance and coordination.  MSK:  Non tender with full range of motion and good stability and symmetric strength and tone of shoulders, elbows, wrist, hip, and ankles bilaterally.  Knee: Bilateral valgus deformity noted.  No abnormal thigh to calf ratio.  Tender to palpation over medial and PF joint line.  ROM full in flexion and extension and lower leg rotation. instability with valgus force.  painful patellar compression. Patellar glide with moderate crepitus. Patellar and quadriceps tendons unremarkable. Hamstring and quadriceps strength is normal.   After informed written and verbal consent, patient was seated on exam table. Right knee was prepped with alcohol swab and utilizing anterolateral approach, patient's right knee space was injected with 4:1  marcaine 0.5%: Kenalog 40mg /dL. Patient tolerated the procedure well without immediate complications.  After informed written and verbal consent, patient was seated on exam table. Left knee was prepped with alcohol swab and  utilizing anterolateral approach, patient's left knee space was injected with 4:1  marcaine 0.5%: Kenalog 40mg /dL. Patient tolerated the procedure well without immediate complications.   Impression and Recommendations:     This case required medical decision making of moderate complexity.      Note: This dictation was prepared with Dragon dictation along with smaller phrase technology. Any transcriptional errors that result from this process are unintentional.

## 2018-06-15 DIAGNOSIS — J45909 Unspecified asthma, uncomplicated: Secondary | ICD-10-CM | POA: Diagnosis not present

## 2018-06-15 DIAGNOSIS — F419 Anxiety disorder, unspecified: Secondary | ICD-10-CM | POA: Diagnosis not present

## 2018-06-15 MED FILL — OLMESARTAN-HCTZ 40-25 MG TA: 40-25 | 90 days supply | Qty: 90 | Fill #2

## 2018-06-15 MED FILL — AZITHROMYCIN 250 MG TABLET: 250 | 5 days supply | Qty: 6 | Fill #0

## 2018-06-15 MED FILL — HYDROCODONE-HOMATROPINE SOL: 5-1.5 | 5 days supply | Qty: 100 | Fill #0

## 2018-06-15 MED FILL — CITALOPRAM HBR 40 MG TABLET: 40 | 30 days supply | Qty: 30 | Fill #4

## 2018-06-16 ENCOUNTER — Encounter: Payer: Self-pay | Admitting: Family Medicine

## 2018-06-16 ENCOUNTER — Ambulatory Visit: Payer: 59 | Admitting: Family Medicine

## 2018-06-16 DIAGNOSIS — M170 Bilateral primary osteoarthritis of knee: Secondary | ICD-10-CM | POA: Insufficient documentation

## 2018-06-16 MED ORDER — DOXYCYCLINE HYCLATE 100 MG PO TABS: 100.0000 mg | ORAL_TABLET | Freq: Two times a day (BID) | ORAL | 0 refills | Status: AC

## 2018-06-16 MED FILL — DOXYCYCLINE HYCLATE 100 MG: 100 | 7 days supply | Qty: 14 | Fill #0

## 2018-06-16 MED FILL — ALPRAZolam 0.5 MG TABS: 0.5 | 7 days supply | Qty: 30 | Fill #0

## 2018-06-16 NOTE — Assessment & Plan Note (Signed)
Bilateral knee injections given today.  Tolerated the procedure well.  Discussed monitoring patient's blood sugars.  Patient is going to try topical anti-inflammatories and icing regimen.  Worsening symptoms could be a candidate for Visco supplementation.  We will try to get approval and have patient follow-up after that.

## 2018-07-04 NOTE — Progress Notes (Signed)
[name] [address] [place], [place] [address] Phone: [phone] Subjective:    I Rhonda [name] am serving as a Education administrator for Dr. Hulan Saas.  CC: Bilateral knee pain  [id number]  Rhonda [name] is a 62 y.o. female coming in with complaint of bilateral knee pain. States her knees are ok today. Here for bilateral knee injections. Left wrist is very painful.  Arthritic changes of the knees.  Given injections in the knees 3 weeks ago.  No significant improvement.  Affecting daily activities as a child.  Will be leaving the country in the next several weeks to do a lot of walking as well.  Has had an approval for possible Visco supplementation but is here for further evaluation      Past Medical History:  Diagnosis Date  . Anxiety   . Depression   . Encephalitis   . Hepatitis    ? B when age 23  . HTN (hypertension) 09/28/2013  . Hypertension   . Migraines   . Trigeminal neuralgia    Past Surgical History:  Procedure Laterality Date  . BRAIN SURGERY  2013   trigeminal neuralgia  . BREAST SURGERY     age 37-benign,  . COLONOSCOPY WITH PROPOFOL N/A 08/01/2013   Procedure: COLONOSCOPY WITH PROPOFOL;  Surgeon: Garlan Fair, MD;  Location: [facility];  Service: Endoscopy;  Laterality: N/A;  . CRANIOTOMY    . gamma knife     x1   Social History   Socioeconomic History  . Marital status: Married    Spouse name: Not on file  . Number of children: Not on file  . Years of education: Not on file  . Highest education level: Not on file  Occupational History  . Occupation: Nurse  Social Needs  . Financial resource strain: Not on file  . Food insecurity:    Worry: Not on file    Inability: Not on file  . Transportation needs:    Medical: Not on file    Non-medical: Not on file  Tobacco Use  . Smoking status: Never Smoker  . Smokeless tobacco: Never Used  Substance and Sexual Activity  . Alcohol use: Yes    Comment: occassionally  wine  . Drug use: No  . Sexual activity: Not on file  Lifestyle  . Physical activity:    Days per week: Not on file    Minutes per session: Not on file  . Stress: Not on file  Relationships  . Social connections:    Talks on phone: Not on file    Gets together: Not on file    Attends religious service: Not on file    Active member of club or organization: Not on file    Attends meetings of clubs or organizations: Not on file    Relationship status: Not on file  Other Topics Concern  . Not on file  Social History Narrative   Lives with husband, Richardson Landry   Caffeine use: 2 cups coffee per day   Right handed    Allergies  Allergen Reactions  . Amlodipine Swelling  . Lisinopril Cough   Family History  Problem Relation Age of Onset  . Arthritis Mother   . Hyperlipidemia Mother   . Heart disease Mother   . Stroke Mother   . Hypertension Mother   . Diabetes Mother   . Arthritis Father   . Hyperlipidemia Father   . Heart disease Father   . Stroke  Father   . Hypertension Father   . Diabetes Father   . Cancer Maternal Grandmother   . Kidney disease Maternal Grandmother   . Mental illness Maternal Grandmother   . Hypertension Maternal Grandmother   . Heart disease Maternal Grandmother   . Cancer Maternal Grandfather   . Kidney disease Maternal Grandfather   . Mental illness Maternal Grandfather   . Hypertension Maternal Grandfather   . Heart disease Maternal Grandfather   . Cancer Paternal Grandmother   . Kidney disease Paternal Grandmother   . Mental illness Paternal Grandmother   . Hypertension Paternal Grandmother   . Heart disease Paternal Grandmother   . Cancer Paternal Grandfather   . Kidney disease Paternal Grandfather   . Mental illness Paternal Grandfather   . Hypertension Paternal Grandfather   . Heart disease Paternal Grandfather      Past medical history, social, surgical and family history all reviewed in electronic medical record.  No pertanent  information unless stated regarding to the chief complaint.   Review of Systems:Review of systems updated and as accurate as of   No headache, visual changes, nausea, vomiting, diarrhea, constipation, dizziness, abdominal pain, skin rash, fevers, chills, night sweats, weight loss, swollen lymph nodes, body aches, joint swelling,  chest pain, shortness of breath, mood changes.  Positive muscle aches  Objective  Blood pressure 140/76, pulse 68, height 5\' 2"  (1.575 m), SpO2 97 %.    General: No apparent distress alert and oriented x3 mood and affect normal, dressed appropriately.  HEENT: Pupils equal, extraocular movements intact  Respiratory: Patient's speak in full sentences and does not appear short of breath  Cardiovascular: No lower extremity edema, non tender, no erythema  Skin: Warm dry intact with no signs of infection or rash on extremities or on axial skeleton.  Abdomen: Soft nontender  Neuro: Cranial nerves II through XII are intact, neurovascularly intact in all extremities with 2+ DTRs and 2+ pulses.  Lymph: No lymphadenopathy of posterior or anterior cervical chain or axillae bilaterally.  Gait antalgic MSK:  Non tender with full range of motion and good stability and symmetric strength and tone of shoulders, elbows, wrist, hip, and ankles bilaterally.  Knee: Bilateral valgus deformity noted.  Abnormal thigh to calf ratio.  Tender to palpation over medial and PF joint line.  ROM full in flexion and extension and lower leg rotation. instability with valgus force.  painful patellar compression. Patellar glide with moderate crepitus. Patellar and quadriceps tendons unremarkable. Hamstring and quadriceps strength is normal.   After informed written and verbal consent, patient was seated on exam table. Right knee was prepped with alcohol swab and utilizing anterolateral approach, patient's right knee space was injected with15 mg/2.5 mL of Orthovisc(sodium hyaluronate) in a  prefilled syringe was injected easily into the knee through a 22-gauge needle..Patient tolerated the procedure well without immediate complications.  After informed written and verbal consent, patient was seated on exam table. Left knee was prepped with alcohol swab and utilizing anterolateral approach, patient's left knee space was injected with15 mg/2.5 mL of Orthovisc(sodium hyaluronate) in a prefilled syringe was injected easily into the knee through a 22-gauge needle..Patient tolerated the procedure well without immediate complications. Impression and Recommendations:     This case required medical decision making of moderate complexity.  The above documentation has been reviewed and is accurate and complete Lyndal Pulley      Note: This dictation was prepared with Dragon dictation along with smaller phrase technology. Any transcriptional errors that  result from this process are unintentional.

## 2018-07-05 ENCOUNTER — Ambulatory Visit: Payer: 59 | Admitting: Family Medicine

## 2018-07-05 ENCOUNTER — Encounter: Payer: Self-pay | Admitting: Family Medicine

## 2018-07-05 DIAGNOSIS — M170 Bilateral primary osteoarthritis of knee: Secondary | ICD-10-CM | POA: Diagnosis not present

## 2018-07-05 MED FILL — metFORMIN HCL 500 MG TABS: 500 | 30 days supply | Qty: 60 | Fill #0

## 2018-07-05 NOTE — Assessment & Plan Note (Signed)
Worsening symptoms.  Failed all conservative therapy.  Started on Orthovisc supplementation.  Given injections today.  Patient knows to continue all other conservative therapy including bracing.  Topical anti-inflammatories.  May need possibly prednisone for patient's trip and will discuss before patient leaves.  Following up again on Friday for second in a series of 4 injections.

## 2018-07-05 NOTE — Patient Instructions (Signed)
Good to see you  Ice is your friend We will see you soon!

## 2018-07-07 NOTE — Progress Notes (Signed)
[name] [address] [place], [place] [address] Phone: [phone] Subjective:    I Rhonda [name] am serving as a Education administrator for Dr. Hulan Saas.  CC: Bilateral knee pain  [id number]  Rhonda [name] is a 62 y.o. female coming in with complaint of bilateral knee pain. Here today for orthovisc #2.  Mild improvement     Past Medical History:  Diagnosis Date  . Anxiety   . Depression   . Encephalitis   . Hepatitis    ? B when age 18  . HTN (hypertension) 09/28/2013  . Hypertension   . Migraines   . Trigeminal neuralgia    Past Surgical History:  Procedure Laterality Date  . BRAIN SURGERY  2013   trigeminal neuralgia  . BREAST SURGERY     age 14-benign,  . COLONOSCOPY WITH PROPOFOL N/A 08/01/2013   Procedure: COLONOSCOPY WITH PROPOFOL;  Surgeon: Rhonda Fair, MD;  Location: [facility];  Service: Endoscopy;  Laterality: N/A;  . CRANIOTOMY    . gamma knife     x1   Social History   Socioeconomic History  . Marital status: Married    Spouse name: Not on file  . Number of children: Not on file  . Years of education: Not on file  . Highest education level: Not on file  Occupational History  . Occupation: Nurse  Social Needs  . Financial resource strain: Not on file  . Food insecurity:    Worry: Not on file    Inability: Not on file  . Transportation needs:    Medical: Not on file    Non-medical: Not on file  Tobacco Use  . Smoking status: Never Smoker  . Smokeless tobacco: Never Used  Substance and Sexual Activity  . Alcohol use: Yes    Comment: occassionally wine  . Drug use: No  . Sexual activity: Not on file  Lifestyle  . Physical activity:    Days per week: Not on file    Minutes per session: Not on file  . Stress: Not on file  Relationships  . Social connections:    Talks on phone: Not on file    Gets together: Not on file    Attends religious service: Not on file    Active member of club or organization:  Not on file    Attends meetings of clubs or organizations: Not on file    Relationship status: Not on file  Other Topics Concern  . Not on file  Social History Narrative   Lives with husband, Rhonda [name]   Caffeine use: 2 cups coffee per day   Right handed    Allergies  Allergen Reactions  . Amlodipine Swelling  . Lisinopril Cough   Family History  Problem Relation Age of Onset  . Arthritis Mother   . Hyperlipidemia Mother   . Heart disease Mother   . Stroke Mother   . Hypertension Mother   . Diabetes Mother   . Arthritis Father   . Hyperlipidemia Father   . Heart disease Father   . Stroke Father   . Hypertension Father   . Diabetes Father   . Cancer Maternal Grandmother   . Kidney disease Maternal Grandmother   . Mental illness Maternal Grandmother   . Hypertension Maternal Grandmother   . Heart disease Maternal Grandmother   . Cancer Maternal Grandfather   . Kidney disease Maternal Grandfather   . Mental illness Maternal Grandfather   . Hypertension Maternal  Grandfather   . Heart disease Maternal Grandfather   . Cancer Paternal Grandmother   . Kidney disease Paternal Grandmother   . Mental illness Paternal Grandmother   . Hypertension Paternal Grandmother   . Heart disease Paternal Grandmother   . Cancer Paternal Grandfather   . Kidney disease Paternal Grandfather   . Mental illness Paternal Grandfather   . Hypertension Paternal Grandfather   . Heart disease Paternal Grandfather     Current Outpatient Medications (Endocrine & Metabolic):  .  metFORMIN (GLUCOPHAGE) 500 MG tablet, Take 500 mg by mouth daily with breakfast. .  predniSONE (DELTASONE) 50 MG tablet, Take 1 tablet (50 mg total) by mouth daily.  Current Outpatient Medications (Cardiovascular):  .  amLODipine (NORVASC) 5 MG tablet, Take 5 mg by mouth daily. .  hydrochlorothiazide (HYDRODIURIL) 25 MG tablet, Take 1 tablet (25 mg total) by mouth daily. Marland Kitchen  losartan (COZAAR) 100 MG tablet, Take 100 mg by  mouth daily. .  metoprolol succinate (TOPROL-XL) 100 MG 24 hr tablet, Take 100 mg by mouth every evening. Take with or immediately following a meal.  Current Outpatient Medications (Respiratory):  .  montelukast (SINGULAIR) 10 MG tablet, TAKE 1 TABLET BY MOUTH ONCE DAILY EVERY EVENING .  PROAIR HFA 108 (90 Base) MCG/ACT inhaler, Inhale 2 puffs into the lungs every 6 (six) hours as needed.  Current Outpatient Medications (Analgesics):  .  aspirin EC 81 MG tablet, Take 81 mg by mouth daily. .  Ibuprofen-Famotidine 800-26.6 MG TABS, Take 1 tablet 3 times daily. Marland Kitchen  oxyCODONE-acetaminophen (PERCOCET/ROXICET) 5-325 MG tablet, Take 1 tablet by mouth every 6 (six) hours as needed. for pain   Current Outpatient Medications (Other):  Marland Kitchen  ALPRAZolam (XANAX) 0.5 MG tablet, Take 0.5 mg by mouth every 6 (six) hours as needed. .  citalopram (CELEXA) 40 MG tablet, Take 40 mg by mouth daily. .  fish oil-omega-3 fatty acids 1000 MG capsule, Take 2 g by mouth daily. Marland Kitchen  gabapentin (NEURONTIN) 600 MG tablet, Take 300 mg by mouth 3 (three) times daily.  .  Glucosamine 500 MG CAPS, Take 1 capsule by mouth daily. Marland Kitchen  levETIRAcetam (KEPPRA) 500 MG tablet, Take 1 tablet (500 mg total) by mouth 2 (two) times daily. .  Multiple Vitamin (MULTIVITAMIN WITH MINERALS) TABS tablet, Take 1 tablet by mouth daily. .  ondansetron (ZOFRAN ODT) 4 MG disintegrating tablet, Take 1 tablet (4 mg total) by mouth every 8 (eight) hours as needed for nausea or vomiting. .  vitamin E 400 UNIT capsule, Take 400 Units by mouth daily.    Past medical history, social, surgical and family history all reviewed in electronic medical record.  No pertanent information unless stated regarding to the chief complaint.   Review of Systems:  No headache, visual changes, nausea, vomiting, diarrhea, constipation, dizziness, abdominal pain, skin rash, fevers, chills, night sweats, weight loss, swollen lymph nodes, body aches, joint swelling, muscle  aches, chest pain, shortness of breath, mood changes.   Objective  There were no vitals taken for this visit. Systems examined below as of    General: No apparent distress alert and oriented x3 mood and affect normal, dressed appropriately.  HEENT: Pupils equal, extraocular movements intact  Respiratory: Patient's speak in full sentences and does not appear short of breath  Cardiovascular: No lower extremity edema, non tender, no erythema  Skin: Warm dry intact with no signs of infection or rash on extremities or on axial skeleton.  Abdomen: Soft nontender  Neuro: Cranial nerves  II through XII are intact, neurovascularly intact in all extremities with 2+ DTRs and 2+ pulses.  Lymph: No lymphadenopathy of posterior or anterior cervical chain or axillae bilaterally.  Gait normal with good balance and coordination.  MSK:  Non tender with full range of motion and good stability and symmetric strength and tone of shoulders, elbows, wrist, hip, and ankles bilaterally.  Knee: Lateral valgus deformity noted. Large thigh to calf ratio.  Tender to palpation over medial and PF joint line.  ROM full in flexion and extension and lower leg rotation. instability with valgus force.  painful patellar compression. Patellar glide with moderate crepitus. Patellar and quadriceps tendons unremarkable. Hamstring and quadriceps strength is normal.   After informed written and verbal consent, patient was seated on exam table. Right knee was prepped with alcohol swab and utilizing anterolateral approach, patient's right knee space was injected with15 mg/2.5 mL of Orthovisc(sodium hyaluronate) in a prefilled syringe was injected easily into the knee through a 22-gauge needle..Patient tolerated the procedure well without immediate complications.  After informed written and verbal consent, patient was seated on exam table. Left knee was prepped with alcohol swab and utilizing anterolateral approach, patient's left  knee space was injected with15 mg/2.5 mL of Orthovisc(sodium hyaluronate) in a prefilled syringe was injected easily into the knee through a 22-gauge needle..Patient tolerated the procedure well without immediate complications.   Impression and Recommendations:     This case required medical decision making of moderate complexity. The above documentation has been reviewed and is accurate and complete Rhonda Pulley, DO       Note: This dictation was prepared with Dragon dictation along with smaller phrase technology. Any transcriptional errors that result from this process are unintentional.

## 2018-07-08 ENCOUNTER — Encounter: Payer: Self-pay | Admitting: Family Medicine

## 2018-07-08 ENCOUNTER — Ambulatory Visit (INDEPENDENT_AMBULATORY_CARE_PROVIDER_SITE_OTHER): Payer: 59 | Admitting: Family Medicine

## 2018-07-08 DIAGNOSIS — M170 Bilateral primary osteoarthritis of knee: Secondary | ICD-10-CM

## 2018-07-08 MED ORDER — DOXYCYCLINE HYCLATE 100 MG PO TABS: 100.0000 mg | ORAL_TABLET | Freq: Two times a day (BID) | ORAL | 0 refills | Status: AC

## 2018-07-08 MED FILL — DOXYCYCLINE HYCLATE 100 MG: 100 | 14 days supply | Qty: 28 | Fill #0

## 2018-07-08 NOTE — Assessment & Plan Note (Signed)
Second in a series of 4 injections given today.  Following up Tuesday for third in a series of 4 injections.

## 2018-07-08 NOTE — Patient Instructions (Addendum)
2 down and 2 to go  See you before the trip

## 2018-07-09 MED FILL — CITALOPRAM HBR 40 MG TABLET: 40 | 30 days supply | Qty: 30 | Fill #5

## 2018-07-10 NOTE — Progress Notes (Signed)
[name] [place], [place] [address] Phone: [phone] Subjective:      I Rhonda [name] am serving as a Education administrator for Dr. Hulan Saas.  CC: Bilateral knee pain  [id number]  Rhonda [name] is a 62 y.o. female coming in with complaint of bilateral knee pain. States her knees felt tight earlier in the week but they are doing better today. Tight overall      Past Medical History:  Diagnosis Date  . Anxiety   . Depression   . Encephalitis   . Hepatitis    ? B when age 12  . HTN (hypertension) 09/28/2013  . Hypertension   . Migraines   . Trigeminal neuralgia    Past Surgical History:  Procedure Laterality Date  . BRAIN SURGERY  2013   trigeminal neuralgia  . BREAST SURGERY     age 50-benign,  . COLONOSCOPY WITH PROPOFOL N/A 08/01/2013   Procedure: COLONOSCOPY WITH PROPOFOL;  Surgeon: Garlan Fair, MD;  Location: [facility];  Service: Endoscopy;  Laterality: N/A;  . CRANIOTOMY    . gamma knife     x1   Social History   Socioeconomic History  . Marital status: Married    Spouse name: Not on file  . Number of children: Not on file  . Years of education: Not on file  . Highest education level: Not on file  Occupational History  . Occupation: Nurse  Social Needs  . Financial resource strain: Not on file  . Food insecurity:    Worry: Not on file    Inability: Not on file  . Transportation needs:    Medical: Not on file    Non-medical: Not on file  Tobacco Use  . Smoking status: Never Smoker  . Smokeless tobacco: Never Used  Substance and Sexual Activity  . Alcohol use: Yes    Comment: occassionally wine  . Drug use: No  . Sexual activity: Not on file  Lifestyle  . Physical activity:    Days per week: Not on file    Minutes per session: Not on file  . Stress: Not on file  Relationships  . Social connections:    Talks on phone: Not on file    Gets together: Not on file    Attends religious service: Not  on file    Active member of club or organization: Not on file    Attends meetings of clubs or organizations: Not on file    Relationship status: Not on file  Other Topics Concern  . Not on file  Social History Narrative   Lives with husband, Rhonda [name]   Caffeine use: 2 cups coffee per day   Right handed    Allergies  Allergen Reactions  . Amlodipine Swelling  . Lisinopril Cough   Family History  Problem Relation Age of Onset  . Arthritis Mother   . Hyperlipidemia Mother   . Heart disease Mother   . Stroke Mother   . Hypertension Mother   . Diabetes Mother   . Arthritis Father   . Hyperlipidemia Father   . Heart disease Father   . Stroke Father   . Hypertension Father   . Diabetes Father   . Cancer Maternal Grandmother   . Kidney disease Maternal Grandmother   . Mental illness Maternal Grandmother   . Hypertension Maternal Grandmother   . Heart disease Maternal Grandmother   . Cancer Maternal Grandfather   . Kidney disease Maternal Grandfather   .  Mental illness Maternal Grandfather   . Hypertension Maternal Grandfather   . Heart disease Maternal Grandfather   . Cancer Paternal Grandmother   . Kidney disease Paternal Grandmother   . Mental illness Paternal Grandmother   . Hypertension Paternal Grandmother   . Heart disease Paternal Grandmother   . Cancer Paternal Grandfather   . Kidney disease Paternal Grandfather   . Mental illness Paternal Grandfather   . Hypertension Paternal Grandfather   . Heart disease Paternal Grandfather     Current Outpatient Medications (Endocrine & Metabolic):  .  metFORMIN (GLUCOPHAGE) 500 MG tablet, Take 500 mg by mouth daily with breakfast. .  predniSONE (DELTASONE) 50 MG tablet, Take 1 tablet (50 mg total) by mouth daily.  Current Outpatient Medications (Cardiovascular):  .  amLODipine (NORVASC) 5 MG tablet, Take 5 mg by mouth daily. .  hydrochlorothiazide (HYDRODIURIL) 25 MG tablet, Take 1 tablet (25 mg total) by mouth daily. Marland Kitchen   losartan (COZAAR) 100 MG tablet, Take 100 mg by mouth daily. .  metoprolol succinate (TOPROL-XL) 100 MG 24 hr tablet, Take 100 mg by mouth every evening. Take with or immediately following a meal.  Current Outpatient Medications (Respiratory):  .  montelukast (SINGULAIR) 10 MG tablet, TAKE 1 TABLET BY MOUTH ONCE DAILY EVERY EVENING .  PROAIR HFA 108 (90 Base) MCG/ACT inhaler, Inhale 2 puffs into the lungs every 6 (six) hours as needed.  Current Outpatient Medications (Analgesics):  .  aspirin EC 81 MG tablet, Take 81 mg by mouth daily. .  Ibuprofen-Famotidine 800-26.6 MG TABS, Take 1 tablet 3 times daily. Marland Kitchen  oxyCODONE-acetaminophen (PERCOCET/ROXICET) 5-325 MG tablet, Take 1 tablet by mouth every 6 (six) hours as needed. for pain   Current Outpatient Medications (Other):  Marland Kitchen  ALPRAZolam (XANAX) 0.5 MG tablet, Take 0.5 mg by mouth every 6 (six) hours as needed. .  citalopram (CELEXA) 40 MG tablet, Take 40 mg by mouth daily. Marland Kitchen  doxycycline (VIBRA-TABS) 100 MG tablet, Take 1 tablet (100 mg total) by mouth 2 (two) times daily for 14 days. .  fish oil-omega-3 fatty acids 1000 MG capsule, Take 2 g by mouth daily. Marland Kitchen  gabapentin (NEURONTIN) 600 MG tablet, Take 300 mg by mouth 3 (three) times daily.  .  Glucosamine 500 MG CAPS, Take 1 capsule by mouth daily. Marland Kitchen  levETIRAcetam (KEPPRA) 500 MG tablet, Take 1 tablet (500 mg total) by mouth 2 (two) times daily. .  Multiple Vitamin (MULTIVITAMIN WITH MINERALS) TABS tablet, Take 1 tablet by mouth daily. .  ondansetron (ZOFRAN ODT) 4 MG disintegrating tablet, Take 1 tablet (4 mg total) by mouth every 8 (eight) hours as needed for nausea or vomiting. .  vitamin E 400 UNIT capsule, Take 400 Units by mouth daily.    Past medical history, social, surgical and family history all reviewed in electronic medical record.  No pertanent information unless stated regarding to the chief complaint.   Review of Systems:  No headache, visual changes, nausea, vomiting,  diarrhea, constipation, dizziness, abdominal pain, skin rash, fevers, chills, night sweats, weight loss, swollen lymph nodes, body aches, joint swelling, chest pain, shortness of breath, mood changes. +muscle aches.   Objective  Blood pressure (!) 144/90, pulse 62, height 5\' 2"  (1.575 m), SpO2 98 %.    General: No apparent distress alert and oriented x3 mood and affect normal, dressed appropriately.  HEENT: Pupils equal, extraocular movements intact  Respiratory: Patient's speak in full sentences and does not appear short of breath  Cardiovascular: No lower  extremity edema, non tender, no erythema  Skin: Warm dry intact with no signs of infection or rash on extremities or on axial skeleton.  Abdomen: Soft nontender  Neuro: Cranial nerves II through XII are intact, neurovascularly intact in all extremities with 2+ DTRs and 2+ pulses.  Lymph: No lymphadenopathy of posterior or anterior cervical chain or axillae bilaterally.  Gait normal with good balance and coordination.  MSK:  Non tender with full range of motion and good stability and symmetric strength and tone of shoulders, elbows, wrist, hip, and ankles bilaterally.  Knee: bilateral  valgus deformity noted. Abnormal thigh to calf ratio.  Tender to palpation over medial and PF joint line.  ROM full in flexion and extension and lower leg rotation. instability with valgus force.  painful patellar compression. Patellar glide with moderate crepitus. Patellar and quadriceps tendons unremarkable. Hamstring and quadriceps strength is normal.  After informed written and verbal consent, patient was seated on exam table. Right knee was prepped with alcohol swab and utilizing anterolateral approach, patient's right knee space was injected with15 mg/2.5 mL of Orthovisc(sodium hyaluronate) in a prefilled syringe was injected easily into the knee through a 22-gauge needle..Patient tolerated the procedure well without immediate complications.  After  informed written and verbal consent, patient was seated on exam table. Left knee was prepped with alcohol swab and utilizing anterolateral approach, patient's left knee space was injected with15 mg/2.5 mL of Orthovisc(sodium hyaluronate) in a prefilled syringe was injected easily into the knee through a 22-gauge needle..Patient tolerated the procedure well without immediate complications.    Impression and Recommendations:     This case required medical decision making of moderate complexity. The above documentation has been reviewed and is accurate and complete Lyndal Pulley, DO       Note: This dictation was prepared with Dragon dictation along with smaller phrase technology. Any transcriptional errors that result from this process are unintentional.

## 2018-07-12 ENCOUNTER — Encounter: Payer: Self-pay | Admitting: Family Medicine

## 2018-07-12 ENCOUNTER — Ambulatory Visit (INDEPENDENT_AMBULATORY_CARE_PROVIDER_SITE_OTHER): Payer: 59 | Admitting: Family Medicine

## 2018-07-12 DIAGNOSIS — M170 Bilateral primary osteoarthritis of knee: Secondary | ICD-10-CM

## 2018-07-12 NOTE — Patient Instructions (Signed)
Have a wonderful trip  Tightness is normal but should get better See you when you get back!!!

## 2018-07-12 NOTE — Assessment & Plan Note (Signed)
Bilateral injections given again today.  3 out of 4 done.  One more well be done in both knees when patient [place] the country.  Continue conservative therapy.  Topical anti-inflammatories and oral anti-inflammatories given.

## 2018-07-29 MED FILL — ALPRAZolam 0.5 MG TABS: 0.5 | 8 days supply | Qty: 30 | Fill #0

## 2018-07-29 NOTE — Progress Notes (Deleted)
[name] [address] [place], [place] [address] Phone: [phone] Subjective:    I'm seeing this patient by the request  of:    CC:   [id number]  SAPIR LAVEY is a 62 y.o. female coming in with complaint of ***  Onset-  Location Duration-  Character- Aggravating factors- Reliving factors-  Therapies tried-  Severity-     Past Medical History:  Diagnosis Date  . Anxiety   . Depression   . Encephalitis   . Hepatitis    ? B when age 79  . HTN (hypertension) 09/28/2013  . Hypertension   . Migraines   . Trigeminal neuralgia    Past Surgical History:  Procedure Laterality Date  . BRAIN SURGERY  2013   trigeminal neuralgia  . BREAST SURGERY     age 20-benign,  . COLONOSCOPY WITH PROPOFOL N/A 08/01/2013   Procedure: COLONOSCOPY WITH PROPOFOL;  Surgeon: Garlan Fair, MD;  Location: [facility];  Service: Endoscopy;  Laterality: N/A;  . CRANIOTOMY    . gamma knife     x1   Social History   Socioeconomic History  . Marital status: Married    Spouse name: Not on file  . Number of children: Not on file  . Years of education: Not on file  . Highest education level: Not on file  Occupational History  . Occupation: Nurse  Social Needs  . Financial resource strain: Not on file  . Food insecurity:    Worry: Not on file    Inability: Not on file  . Transportation needs:    Medical: Not on file    Non-medical: Not on file  Tobacco Use  . Smoking status: Never Smoker  . Smokeless tobacco: Never Used  Substance and Sexual Activity  . Alcohol use: Yes    Comment: occassionally wine  . Drug use: No  . Sexual activity: Not on file  Lifestyle  . Physical activity:    Days per week: Not on file    Minutes per session: Not on file  . Stress: Not on file  Relationships  . Social connections:    Talks on phone: Not on file    Gets together: Not on file    Attends religious service: Not on file    Active member of club or  organization: Not on file    Attends meetings of clubs or organizations: Not on file    Relationship status: Not on file  Other Topics Concern  . Not on file  Social History Narrative   Lives with husband, Richardson Landry   Caffeine use: 2 cups coffee per day   Right handed    Allergies  Allergen Reactions  . Amlodipine Swelling  . Lisinopril Cough   Family History  Problem Relation Age of Onset  . Arthritis Mother   . Hyperlipidemia Mother   . Heart disease Mother   . Stroke Mother   . Hypertension Mother   . Diabetes Mother   . Arthritis Father   . Hyperlipidemia Father   . Heart disease Father   . Stroke Father   . Hypertension Father   . Diabetes Father   . Cancer Maternal Grandmother   . Kidney disease Maternal Grandmother   . Mental illness Maternal Grandmother   . Hypertension Maternal Grandmother   . Heart disease Maternal Grandmother   . Cancer Maternal Grandfather   . Kidney disease Maternal Grandfather   . Mental illness Maternal Grandfather   .  Hypertension Maternal Grandfather   . Heart disease Maternal Grandfather   . Cancer Paternal Grandmother   . Kidney disease Paternal Grandmother   . Mental illness Paternal Grandmother   . Hypertension Paternal Grandmother   . Heart disease Paternal Grandmother   . Cancer Paternal Grandfather   . Kidney disease Paternal Grandfather   . Mental illness Paternal Grandfather   . Hypertension Paternal Grandfather   . Heart disease Paternal Grandfather     Current Outpatient Medications (Endocrine & Metabolic):  .  metFORMIN (GLUCOPHAGE) 500 MG tablet, Take 500 mg by mouth daily with breakfast. .  predniSONE (DELTASONE) 50 MG tablet, Take 1 tablet (50 mg total) by mouth daily.  Current Outpatient Medications (Cardiovascular):  .  amLODipine (NORVASC) 5 MG tablet, Take 5 mg by mouth daily. .  hydrochlorothiazide (HYDRODIURIL) 25 MG tablet, Take 1 tablet (25 mg total) by mouth daily. Marland Kitchen  losartan (COZAAR) 100 MG tablet, Take  100 mg by mouth daily. .  metoprolol succinate (TOPROL-XL) 100 MG 24 hr tablet, Take 100 mg by mouth every evening. Take with or immediately following a meal.  Current Outpatient Medications (Respiratory):  .  montelukast (SINGULAIR) 10 MG tablet, TAKE 1 TABLET BY MOUTH ONCE DAILY EVERY EVENING .  PROAIR HFA 108 (90 Base) MCG/ACT inhaler, Inhale 2 puffs into the lungs every 6 (six) hours as needed.  Current Outpatient Medications (Analgesics):  .  aspirin EC 81 MG tablet, Take 81 mg by mouth daily. .  Ibuprofen-Famotidine 800-26.6 MG TABS, Take 1 tablet 3 times daily. Marland Kitchen  oxyCODONE-acetaminophen (PERCOCET/ROXICET) 5-325 MG tablet, Take 1 tablet by mouth every 6 (six) hours as needed. for pain   Current Outpatient Medications (Other):  Marland Kitchen  ALPRAZolam (XANAX) 0.5 MG tablet, Take 0.5 mg by mouth every 6 (six) hours as needed. .  citalopram (CELEXA) 40 MG tablet, Take 40 mg by mouth daily. .  fish oil-omega-3 fatty acids 1000 MG capsule, Take 2 g by mouth daily. Marland Kitchen  gabapentin (NEURONTIN) 600 MG tablet, Take 300 mg by mouth 3 (three) times daily.  .  Glucosamine 500 MG CAPS, Take 1 capsule by mouth daily. Marland Kitchen  levETIRAcetam (KEPPRA) 500 MG tablet, Take 1 tablet (500 mg total) by mouth 2 (two) times daily. .  Multiple Vitamin (MULTIVITAMIN WITH MINERALS) TABS tablet, Take 1 tablet by mouth daily. .  ondansetron (ZOFRAN ODT) 4 MG disintegrating tablet, Take 1 tablet (4 mg total) by mouth every 8 (eight) hours as needed for nausea or vomiting. .  vitamin E 400 UNIT capsule, Take 400 Units by mouth daily.    Past medical history, social, surgical and family history all reviewed in electronic medical record.  No pertanent information unless stated regarding to the chief complaint.   Review of Systems:  No headache, visual changes, nausea, vomiting, diarrhea, constipation, dizziness, abdominal pain, skin rash, fevers, chills, night sweats, weight loss, swollen lymph nodes, body aches, joint swelling,  muscle aches, chest pain, shortness of breath, mood changes.   Objective  There were no vitals taken for this visit. Systems examined below as of    General: No apparent distress alert and oriented x3 mood and affect normal, dressed appropriately.  HEENT: Pupils equal, extraocular movements intact  Respiratory: Patient's speak in full sentences and does not appear short of breath  Cardiovascular: No lower extremity edema, non tender, no erythema  Skin: Warm dry intact with no signs of infection or rash on extremities or on axial skeleton.  Abdomen: Soft nontender  Neuro:  Cranial nerves II through XII are intact, neurovascularly intact in all extremities with 2+ DTRs and 2+ pulses.  Lymph: No lymphadenopathy of posterior or anterior cervical chain or axillae bilaterally.  Gait normal with good balance and coordination.  MSK:  Non tender with full range of motion and good stability and symmetric strength and tone of shoulders, elbows, wrist, hip, knee and ankles bilaterally.     Impression and Recommendations:     This case required medical decision making of moderate complexity. The above documentation has been reviewed and is accurate and complete Lyndal Pulley, DO       Note: This dictation was prepared with Dragon dictation along with smaller phrase technology. Any transcriptional errors that result from this process are unintentional.

## 2018-08-01 ENCOUNTER — Ambulatory Visit: Payer: 59 | Admitting: Family Medicine

## 2018-08-03 ENCOUNTER — Ambulatory Visit (INDEPENDENT_AMBULATORY_CARE_PROVIDER_SITE_OTHER): Payer: 59 | Admitting: Family Medicine

## 2018-08-03 DIAGNOSIS — M170 Bilateral primary osteoarthritis of knee: Secondary | ICD-10-CM | POA: Diagnosis not present

## 2018-08-03 NOTE — Assessment & Plan Note (Signed)
Fourth and final injection given today.  Tolerated procedure well for the viscosupplementation.  Has had good response already and I do expect patient to do well for quite some time.  Patient will follow-up again with me in 6 weeks if any worsening pain.

## 2018-08-03 NOTE — Patient Instructions (Signed)
Good to see you  4th and final  Will help over the next month even  Have an appointment just in case in 6 weeks otherwise see me when you need me

## 2018-08-03 NOTE — Progress Notes (Signed)
[name] [address] [place], [place] [address] Phone: [phone] Subjective:   [name], am serving as a scribe for Dr. Hulan Saas.   CC: Bilateral knee pain  [id number]  Rhonda [name] is a 62 y.o. female coming in with complaint of bilateral knee pain. She is here for the 4th orthovisc injection. She is having relief from the series. Did recently go to [place] and climbed stairs without pain.        Past Medical History:  Diagnosis Date  . Anxiety   . Depression   . Encephalitis   . Hepatitis    ? B when age 7  . HTN (hypertension) 09/28/2013  . Hypertension   . Migraines   . Trigeminal neuralgia    Past Surgical History:  Procedure Laterality Date  . BRAIN SURGERY  2013   trigeminal neuralgia  . BREAST SURGERY     age 37-benign,  . COLONOSCOPY WITH PROPOFOL N/A 08/01/2013   Procedure: COLONOSCOPY WITH PROPOFOL;  Surgeon: Garlan Fair, MD;  Location: [facility];  Service: Endoscopy;  Laterality: N/A;  . CRANIOTOMY    . gamma knife     x1   Social History   Socioeconomic History  . Marital status: Married    Spouse name: Not on file  . Number of children: Not on file  . Years of education: Not on file  . Highest education level: Not on file  Occupational History  . Occupation: Nurse  Social Needs  . Financial resource strain: Not on file  . Food insecurity:    Worry: Not on file    Inability: Not on file  . Transportation needs:    Medical: Not on file    Non-medical: Not on file  Tobacco Use  . Smoking status: Never Smoker  . Smokeless tobacco: Never Used  Substance and Sexual Activity  . Alcohol use: Yes    Comment: occassionally wine  . Drug use: [name]  . Sexual activity: Not on file  Lifestyle  . Physical activity:    Days per week: Not on file    Minutes per session: Not on file  . Stress: Not on file  Relationships  . Social connections:    Talks on phone: Not on file    Gets together:  Not on file    Attends religious service: Not on file    Active member of club or organization: Not on file    Attends meetings of clubs or organizations: Not on file    Relationship status: Not on file  Other Topics Concern  . Not on file  Social History Narrative   Lives with husband, Rhonda [name]   Caffeine use: 2 cups coffee per day   Right handed    Allergies  Allergen Reactions  . Amlodipine Swelling  . Lisinopril Cough   Family History  Problem Relation Age of Onset  . Arthritis Mother   . Hyperlipidemia Mother   . Heart disease Mother   . Stroke Mother   . Hypertension Mother   . Diabetes Mother   . Arthritis Father   . Hyperlipidemia Father   . Heart disease Father   . Stroke Father   . Hypertension Father   . Diabetes Father   . Cancer Maternal Grandmother   . Kidney disease Maternal Grandmother   . Mental illness Maternal Grandmother   . Hypertension Maternal Grandmother   . Heart disease Maternal Grandmother   . Cancer Maternal  Grandfather   . Kidney disease Maternal Grandfather   . Mental illness Maternal Grandfather   . Hypertension Maternal Grandfather   . Heart disease Maternal Grandfather   . Cancer Paternal Grandmother   . Kidney disease Paternal Grandmother   . Mental illness Paternal Grandmother   . Hypertension Paternal Grandmother   . Heart disease Paternal Grandmother   . Cancer Paternal Grandfather   . Kidney disease Paternal Grandfather   . Mental illness Paternal Grandfather   . Hypertension Paternal Grandfather   . Heart disease Paternal Grandfather     Current Outpatient Medications (Endocrine & Metabolic):  .  metFORMIN (GLUCOPHAGE) 500 MG tablet, Take 500 mg by mouth daily with breakfast. .  predniSONE (DELTASONE) 50 MG tablet, Take 1 tablet (50 mg total) by mouth daily.  Current Outpatient Medications (Cardiovascular):  .  amLODipine (NORVASC) 5 MG tablet, Take 5 mg by mouth daily. .  hydrochlorothiazide (HYDRODIURIL) 25 MG tablet,  Take 1 tablet (25 mg total) by mouth daily. Marland Kitchen  losartan (COZAAR) 100 MG tablet, Take 100 mg by mouth daily. .  metoprolol succinate (TOPROL-XL) 100 MG 24 hr tablet, Take 100 mg by mouth every evening. Take with or immediately following a meal.  Current Outpatient Medications (Respiratory):  .  montelukast (SINGULAIR) 10 MG tablet, TAKE 1 TABLET BY MOUTH ONCE DAILY EVERY EVENING .  PROAIR HFA 108 (90 Base) MCG/ACT inhaler, Inhale 2 puffs into the lungs every 6 (six) hours as needed.  Current Outpatient Medications (Analgesics):  .  aspirin EC 81 MG tablet, Take 81 mg by mouth daily. .  Ibuprofen-Famotidine 800-26.6 MG TABS, Take 1 tablet 3 times daily. Marland Kitchen  oxyCODONE-acetaminophen (PERCOCET/ROXICET) 5-325 MG tablet, Take 1 tablet by mouth every 6 (six) hours as needed. for pain   Current Outpatient Medications (Other):  Marland Kitchen  ALPRAZolam (XANAX) 0.5 MG tablet, Take 0.5 mg by mouth every 6 (six) hours as needed. .  citalopram (CELEXA) 40 MG tablet, Take 40 mg by mouth daily. .  fish oil-omega-3 fatty acids 1000 MG capsule, Take 2 g by mouth daily. Marland Kitchen  gabapentin (NEURONTIN) 600 MG tablet, Take 300 mg by mouth 3 (three) times daily.  .  Glucosamine 500 MG CAPS, Take 1 capsule by mouth daily. Marland Kitchen  levETIRAcetam (KEPPRA) 500 MG tablet, Take 1 tablet (500 mg total) by mouth 2 (two) times daily. .  Multiple Vitamin (MULTIVITAMIN WITH MINERALS) TABS tablet, Take 1 tablet by mouth daily. .  ondansetron (ZOFRAN ODT) 4 MG disintegrating tablet, Take 1 tablet (4 mg total) by mouth every 8 (eight) hours as needed for nausea or vomiting. .  vitamin E 400 UNIT capsule, Take 400 Units by mouth daily.    Past medical history, social, surgical and family history all reviewed in electronic medical [name].  [name] pertanent information unless stated regarding to the chief complaint.   Review of Systems:  [name] headache, visual changes, nausea, vomiting, diarrhea, constipation, dizziness, abdominal pain, skin rash, fevers,  chills, night sweats, weight loss, swollen lymph nodes, body aches, joint swelling, muscle aches, chest pain, shortness of breath, mood changes.   Objective  Blood pressure 118/88, pulse 73, height 5\' 2"  (1.575 m), weight 200 lb (90.7 kg), SpO2 98 %.    General: [name] apparent distress alert and oriented x3 mood and affect normal, dressed appropriately.  HEENT: Pupils equal, extraocular movements intact  Respiratory: Patient's speak in full sentences and does not appear short of breath  Cardiovascular: [name] lower extremity edema, non tender, [name] erythema  Skin: Warm dry intact with [name] signs of infection or rash on extremities or on axial skeleton.  Abdomen: Soft nontender  Neuro: Cranial nerves II through XII are intact, neurovascularly intact in all extremities with 2+ DTRs and 2+ pulses.  Lymph: [name] lymphadenopathy of posterior or anterior cervical chain or axillae bilaterally.  Gait mild antalgic MSK:  Non tender with full range of motion and good stability and symmetric strength and tone of shoulders, elbows, wrist, hip, and ankles bilaterally.  Knee: Bilateral valgus deformity noted.  Abnormal thigh to calf ratio.  Tender to palpation over medial and PF joint line.  ROM full in flexion and extension and lower leg rotation. instability with valgus force.  painful patellar compression. Patellar glide with moderate crepitus. Patellar and quadriceps tendons unremarkable. Hamstring and quadriceps strength is normal.  After informed written and verbal consent, patient was seated on exam table. Right knee was prepped with alcohol swab and utilizing anterolateral approach, patient's right knee space was injected with15 mg/2.5 mL of Orthovisc(sodium hyaluronate) in a prefilled syringe was injected easily into the knee through a 22-gauge needle..Patient tolerated the procedure well without immediate complications.  After informed written and verbal consent, patient was seated on exam table. Left knee  was prepped with alcohol swab and utilizing anterolateral approach, patient's left knee space was injected with15 mg/2.5 mL of Orthovisc(sodium hyaluronate) in a prefilled syringe was injected easily into the knee through a 22-gauge needle..Patient tolerated the procedure well without immediate complications.    Impression and Recommendations:      The above documentation has been reviewed and is accurate and complete Lyndal Pulley, DO       Note: This dictation was prepared with Dragon dictation along with smaller phrase technology. Any transcriptional errors that result from this process are unintentional.

## 2018-08-18 DIAGNOSIS — M546 Pain in thoracic spine: Secondary | ICD-10-CM | POA: Diagnosis not present

## 2018-08-18 DIAGNOSIS — M9904 Segmental and somatic dysfunction of sacral region: Secondary | ICD-10-CM | POA: Diagnosis not present

## 2018-08-18 DIAGNOSIS — M545 Low back pain: Secondary | ICD-10-CM | POA: Diagnosis not present

## 2018-08-18 DIAGNOSIS — M9903 Segmental and somatic dysfunction of lumbar region: Secondary | ICD-10-CM | POA: Diagnosis not present

## 2018-09-01 MED FILL — ALPRAZolam 0.5 MG TABS: 0.5 | 8 days supply | Qty: 30 | Fill #0

## 2018-09-07 ENCOUNTER — Other Ambulatory Visit: Payer: 59

## 2018-09-07 MED FILL — metFORMIN HCL 500 MG TABS: 500 | 30 days supply | Qty: 60 | Fill #1

## 2018-09-07 MED FILL — METOPROLOL SUCCINATE ER 100: 100 | 90 days supply | Qty: 135 | Fill #0

## 2018-09-07 MED FILL — CITALOPRAM HBR 40 MG TABLET: 40 | 30 days supply | Qty: 30 | Fill #0

## 2018-09-28 MED FILL — ALPRAZolam 0.5 MG TABS: 0.5 | 8 days supply | Qty: 30 | Fill #0

## 2018-10-10 MED FILL — OLMESARTAN-HCTZ 40-25 MG TA: 40-25 | 90 days supply | Qty: 90 | Fill #3

## 2018-11-03 MED FILL — CITALOPRAM HBR 40 MG TABLET: 40 | 30 days supply | Qty: 30 | Fill #1

## 2018-11-03 MED FILL — metFORMIN HCL 500 MG TABS: 500 | 30 days supply | Qty: 60 | Fill #0

## 2018-11-04 MED FILL — ALPRAZolam 0.5 MG TABS: 0.5 | 8 days supply | Qty: 30 | Fill #0

## 2018-11-07 DIAGNOSIS — I10 Essential (primary) hypertension: Secondary | ICD-10-CM | POA: Diagnosis not present

## 2018-11-07 DIAGNOSIS — J069 Acute upper respiratory infection, unspecified: Secondary | ICD-10-CM | POA: Diagnosis not present

## 2018-11-07 MED FILL — BENZONATATE 200 MG CAPS: 200 | 5 days supply | Qty: 15 | Fill #0

## 2018-11-07 MED FILL — VERAPAMIL ER 120 MG TABLET: 120 | 30 days supply | Qty: 30 | Fill #0

## 2018-11-29 ENCOUNTER — Telehealth: Payer: Self-pay | Admitting: Family Medicine

## 2018-11-29 ENCOUNTER — Ambulatory Visit: Payer: 59 | Admitting: Family Medicine

## 2018-11-29 ENCOUNTER — Encounter: Payer: Self-pay | Admitting: Family Medicine

## 2018-11-29 ENCOUNTER — Ambulatory Visit: Payer: Self-pay

## 2018-11-29 VITALS — BP 140/80 | HR 65 | Ht 62.0 in

## 2018-11-29 DIAGNOSIS — M79641 Pain in right hand: Secondary | ICD-10-CM

## 2018-11-29 DIAGNOSIS — M19049 Primary osteoarthritis, unspecified hand: Secondary | ICD-10-CM | POA: Diagnosis not present

## 2018-11-29 DIAGNOSIS — M79642 Pain in left hand: Secondary | ICD-10-CM | POA: Diagnosis not present

## 2018-11-29 DIAGNOSIS — M654 Radial styloid tenosynovitis [de Quervain]: Secondary | ICD-10-CM | POA: Diagnosis not present

## 2018-11-29 MED ORDER — VITAMIN D (ERGOCALCIFEROL) 1.25 MG (50000 UNIT) PO CAPS: 50000.0000 [IU] | ORAL_CAPSULE | ORAL | 0 refills | Status: DC

## 2018-11-29 MED FILL — VIT D2 1.25 MG (50,000 UNIT: 1.25 MG | 84 days supply | Qty: 12 | Fill #0

## 2018-11-29 NOTE — Assessment & Plan Note (Signed)
Injection given today.  Tolerated the procedure well.  Discussed icing regimen and home exercise.  Discussed topical anti-inflammatories.  Follow-up again in 4 to 6 weeks can repeat every 10 weeks if needed.

## 2018-11-29 NOTE — Assessment & Plan Note (Signed)
Patient given injection.  Tolerated procedure well.  Discussed icing regimen and home exercise.  Discussed which activities to do which one to avoid.  Can repeat every 10 weeks.

## 2018-11-29 NOTE — Progress Notes (Signed)
[name] [place] [place], [place] [address] Phone: [phone] Subjective:     CC: Bilateral wrist pain  [id number]  Rhonda [name] is a 63 y.o. female coming in with complaint of hand pain. Bilateral injections.  Patient has been seen previously.  Has had CMC arthritis as well as de Quervain's synovitis.  Patient feels it is more of a tendinitis on the right side.  States that there is more discomfort than usual.  Patient has been doing a lot more repetitive activities recently.  States it is sore.  Intermittently doing exercises and only intermittently wearing the brace  Recently left side started hurting as well.  Starting to affect daily activities.  Rates the severity of pain and 5 out of 10       Past Medical History:  Diagnosis Date  . Anxiety   . Depression   . Encephalitis   . Hepatitis    ? B when age 78  . HTN (hypertension) 09/28/2013  . Hypertension   . Migraines   . Trigeminal neuralgia    Past Surgical History:  Procedure Laterality Date  . BRAIN SURGERY  2013   trigeminal neuralgia  . BREAST SURGERY     age 28-benign,  . COLONOSCOPY WITH PROPOFOL N/A 08/01/2013   Procedure: COLONOSCOPY WITH PROPOFOL;  Surgeon: Garlan Fair, MD;  Location: [facility];  Service: Endoscopy;  Laterality: N/A;  . CRANIOTOMY    . gamma knife     x1   Social History   Socioeconomic History  . Marital status: Married    Spouse name: Not on file  . Number of children: Not on file  . Years of education: Not on file  . Highest education level: Not on file  Occupational History  . Occupation: Nurse  Social Needs  . Financial resource strain: Not on file  . Food insecurity:    Worry: Not on file    Inability: Not on file  . Transportation needs:    Medical: Not on file    Non-medical: Not on file  Tobacco Use  . Smoking status: Never Smoker  . Smokeless tobacco: Never Used  Substance and Sexual Activity  . Alcohol use: Yes     Comment: occassionally wine  . Drug use: No  . Sexual activity: Not on file  Lifestyle  . Physical activity:    Days per week: Not on file    Minutes per session: Not on file  . Stress: Not on file  Relationships  . Social connections:    Talks on phone: Not on file    Gets together: Not on file    Attends religious service: Not on file    Active member of club or organization: Not on file    Attends meetings of clubs or organizations: Not on file    Relationship status: Not on file  Other Topics Concern  . Not on file  Social History Narrative   Lives with husband, Richardson Landry   Caffeine use: 2 cups coffee per day   Right handed    Allergies  Allergen Reactions  . Amlodipine Swelling  . Lisinopril Cough   Family History  Problem Relation Age of Onset  . Arthritis Mother   . Hyperlipidemia Mother   . Heart disease Mother   . Stroke Mother   . Hypertension Mother   . Diabetes Mother   . Arthritis Father   . Hyperlipidemia Father   . Heart disease Father   .  Stroke Father   . Hypertension Father   . Diabetes Father   . Cancer Maternal Grandmother   . Kidney disease Maternal Grandmother   . Mental illness Maternal Grandmother   . Hypertension Maternal Grandmother   . Heart disease Maternal Grandmother   . Cancer Maternal Grandfather   . Kidney disease Maternal Grandfather   . Mental illness Maternal Grandfather   . Hypertension Maternal Grandfather   . Heart disease Maternal Grandfather   . Cancer Paternal Grandmother   . Kidney disease Paternal Grandmother   . Mental illness Paternal Grandmother   . Hypertension Paternal Grandmother   . Heart disease Paternal Grandmother   . Cancer Paternal Grandfather   . Kidney disease Paternal Grandfather   . Mental illness Paternal Grandfather   . Hypertension Paternal Grandfather   . Heart disease Paternal Grandfather     Current Outpatient Medications (Endocrine & Metabolic):  .  metFORMIN (GLUCOPHAGE) 500 MG tablet,  Take 500 mg by mouth daily with breakfast. .  predniSONE (DELTASONE) 50 MG tablet, Take 1 tablet (50 mg total) by mouth daily.  Current Outpatient Medications (Cardiovascular):  .  amLODipine (NORVASC) 5 MG tablet, Take 5 mg by mouth daily. .  hydrochlorothiazide (HYDRODIURIL) 25 MG tablet, Take 1 tablet (25 mg total) by mouth daily. Marland Kitchen  losartan (COZAAR) 100 MG tablet, Take 100 mg by mouth daily. .  metoprolol succinate (TOPROL-XL) 100 MG 24 hr tablet, Take 100 mg by mouth every evening. Take with or immediately following a meal.  Current Outpatient Medications (Respiratory):  .  montelukast (SINGULAIR) 10 MG tablet, TAKE 1 TABLET BY MOUTH ONCE DAILY EVERY EVENING .  PROAIR HFA 108 (90 Base) MCG/ACT inhaler, Inhale 2 puffs into the lungs every 6 (six) hours as needed.  Current Outpatient Medications (Analgesics):  .  aspirin EC 81 MG tablet, Take 81 mg by mouth daily. .  Ibuprofen-Famotidine 800-26.6 MG TABS, Take 1 tablet 3 times daily. Marland Kitchen  oxyCODONE-acetaminophen (PERCOCET/ROXICET) 5-325 MG tablet, Take 1 tablet by mouth every 6 (six) hours as needed. for pain   Current Outpatient Medications (Other):  Marland Kitchen  ALPRAZolam (XANAX) 0.5 MG tablet, Take 0.5 mg by mouth every 6 (six) hours as needed. .  citalopram (CELEXA) 40 MG tablet, Take 40 mg by mouth daily. .  fish oil-omega-3 fatty acids 1000 MG capsule, Take 2 g by mouth daily. Marland Kitchen  gabapentin (NEURONTIN) 600 MG tablet, Take 300 mg by mouth 3 (three) times daily.  .  Glucosamine 500 MG CAPS, Take 1 capsule by mouth daily. Marland Kitchen  levETIRAcetam (KEPPRA) 500 MG tablet, Take 1 tablet (500 mg total) by mouth 2 (two) times daily. .  Multiple Vitamin (MULTIVITAMIN WITH MINERALS) TABS tablet, Take 1 tablet by mouth daily. .  ondansetron (ZOFRAN ODT) 4 MG disintegrating tablet, Take 1 tablet (4 mg total) by mouth every 8 (eight) hours as needed for nausea or vomiting. .  vitamin E 400 UNIT capsule, Take 400 Units by mouth daily. .  Vitamin D,  Ergocalciferol, (DRISDOL) 1.25 MG (50000 UT) CAPS capsule, Take 1 capsule (50,000 Units total) by mouth every 7 (seven) days.    Past medical history, social, surgical and family history all reviewed in electronic medical record.  No pertanent information unless stated regarding to the chief complaint.   Review of Systems:  No headache, visual changes, nausea, vomiting, diarrhea, constipation, dizziness, abdominal pain, skin rash, fevers, chills, night sweats, weight loss, swollen lymph nodes, body aches, joint swelling, muscle aches, chest pain, shortness of  breath, mood changes.   Objective  Blood pressure 140/80, pulse 65, height 5\' 2"  (1.575 m), SpO2 98 %.   General: No apparent distress alert and oriented x3 mood and affect normal, dressed appropriately.  HEENT: Pupils equal, extraocular movements intact  Respiratory: Patient's speak in full sentences and does not appear short of breath  Cardiovascular: No lower extremity edema, non tender, no erythema  Skin: Warm dry intact with no signs of infection or rash on extremities or on axial skeleton.  Abdomen: Soft nontender  Neuro: Cranial nerves II through XII are intact, neurovascularly intact in all extremities with 2+ DTRs and 2+ pulses.  Lymph: No lymphadenopathy of posterior or anterior cervical chain or axillae bilaterally.  MSK:    Right wrist exam shows a patient but does have a positive Finkelstein's test.  Negative grind test.  Mild weakness of grip strength.  Neurovascular intact distally.  Left CMC positive grind test.  Mild thenar eminence wasting noted.  Otherwise exam fairly unremarkable   Procedure: Real-time Ultrasound Guided Injection of right Abductor pollicis longs tendon sheath Device: GE Logiq E  Ultrasound guided injection is preferred based studies that show increased duration, increased effect, greater accuracy, decreased procedural pain, increased response rate with ultrasound guided versus blind injection.    Verbal informed consent obtained.  Time-out conducted.  Noted no overlying erythema, induration, or other signs of local infection.  Skin prepped in a sterile fashion.  Local anesthesia: Topical Ethyl chloride.  With sterile technique and under real time ultrasound guidance:  tendon visualized.  23g 5/8 inch needle inserted distal to proximal approach into tendon sheath. Pictures taken  for needle placement. Patient did have injection of 0.5 cc of 0.5% Marcaine, and 0.5 cc of Kenalog 40 mg/dL. Completed without difficulty  Pain immediately resolved suggesting accurate placement of the medication.  Advised to call if fevers/chills, erythema, induration, drainage, or persistent bleeding.  Images permanently stored and available for review in the ultrasound unit.  Impression: Technically successful ultrasound guided injection.  Procedure: Real-time Ultrasound Guided Injection of left CMC joint Device: GE Logiq Q7 Ultrasound guided injection is preferred based studies that show increased duration, increased effect, greater accuracy, decreased procedural pain, increased response rate, and decreased cost with ultrasound guided versus blind injection.  Verbal informed consent obtained.  Time-out conducted.  Noted no overlying erythema, induration, or other signs of local infection.  Skin prepped in a sterile fashion.  Local anesthesia: Topical Ethyl chloride.  With sterile technique and under real time ultrasound guidance: With a 25-gauge half inch needle injected with 0.5 cc of 0.5% Marcaine and 0.5 cc of Kenalog 40 mg/mL into left CMC joint Completed without difficulty  Pain immediately resolved suggesting accurate placement of the medication.  Advised to call if fevers/chills, erythema, induration, drainage, or persistent bleeding.  Images permanently stored and available for review in the ultrasound unit.  Impression: Technically successful ultrasound guided injection.    Impression and  Recommendations:     This case required medical decision making of moderate complexity. The above documentation has been reviewed and is accurate and complete Lyndal Pulley, DO       Note: This dictation was prepared with Dragon dictation along with smaller phrase technology. Any transcriptional errors that result from this process are unintentional.

## 2018-11-29 NOTE — Telephone Encounter (Signed)
Copied from [address] [id number]. Topic: Appointment Scheduling - Scheduling Inquiry for Clinic >> Nov 29, 2018  8:59 AM Lennox Solders wrote: Reason for CRM: pt is in route home from [facility] and it will take her 2 hours. Pt said dr Tamala Julian told her if she having any trouble he would work her in. Pt is having right hand pain again. Pt would like an appt today

## 2018-11-29 NOTE — Telephone Encounter (Signed)
Spoke with patient. Will work her in this afternoon.

## 2018-11-29 NOTE — Patient Instructions (Addendum)
Good to see you  Ice is your friend Brace when you need it  Once weekly vitamin D for 12 weeks See me when you need me

## 2018-12-02 MED FILL — CITALOPRAM HBR 40 MG TABLET: 40 | 30 days supply | Qty: 30 | Fill #2

## 2018-12-02 MED FILL — METOPROLOL SUCCINATE ER 100: 100 | 90 days supply | Qty: 135 | Fill #0

## 2018-12-02 MED FILL — ALPRAZolam 0.5 MG TABS: 0.5 | 7 days supply | Qty: 30 | Fill #0

## 2018-12-02 MED FILL — VERAPAMIL ER 120 MG TABLET: 120 | 30 days supply | Qty: 30 | Fill #1

## 2018-12-02 MED FILL — metFORMIN HCL 500 MG TABS: 500 | 30 days supply | Qty: 60 | Fill #1

## 2018-12-07 DIAGNOSIS — Z0000 Encounter for general adult medical examination without abnormal findings: Secondary | ICD-10-CM | POA: Diagnosis not present

## 2018-12-08 ENCOUNTER — Ambulatory Visit
Admission: RE | Admit: 2018-12-08 | Discharge: 2018-12-08 | Disposition: A | Discharge status: Home / Self Care | Payer: 59 | Source: Ambulatory Visit | Attending: Obstetrics and Gynecology | Admitting: Obstetrics and Gynecology

## 2018-12-08 DIAGNOSIS — N6320 Unspecified lump in the left breast, unspecified quadrant: Secondary | ICD-10-CM

## 2018-12-08 DIAGNOSIS — N6324 Unspecified lump in the left breast, lower inner quadrant: Secondary | ICD-10-CM | POA: Diagnosis not present

## 2018-12-08 DIAGNOSIS — R922 Inconclusive mammogram: Secondary | ICD-10-CM | POA: Diagnosis not present

## 2018-12-19 DIAGNOSIS — R05 Cough: Secondary | ICD-10-CM | POA: Diagnosis not present

## 2018-12-19 DIAGNOSIS — Z7984 Long term (current) use of oral hypoglycemic drugs: Secondary | ICD-10-CM | POA: Diagnosis not present

## 2018-12-19 DIAGNOSIS — E1169 Type 2 diabetes mellitus with other specified complication: Secondary | ICD-10-CM | POA: Diagnosis not present

## 2018-12-19 DIAGNOSIS — R300 Dysuria: Secondary | ICD-10-CM | POA: Diagnosis not present

## 2018-12-19 DIAGNOSIS — I10 Essential (primary) hypertension: Secondary | ICD-10-CM | POA: Diagnosis not present

## 2019-07-04 DIAGNOSIS — M9904 Segmental and somatic dysfunction of sacral region: Secondary | ICD-10-CM | POA: Diagnosis not present

## 2019-07-04 DIAGNOSIS — M9903 Segmental and somatic dysfunction of lumbar region: Secondary | ICD-10-CM | POA: Diagnosis not present

## 2019-07-04 DIAGNOSIS — M546 Pain in thoracic spine: Secondary | ICD-10-CM | POA: Diagnosis not present

## 2019-07-04 DIAGNOSIS — M545 Low back pain: Secondary | ICD-10-CM | POA: Diagnosis not present

## 2019-07-20 NOTE — Progress Notes (Signed)
[name] [address] [place], [place] [address] Phone: [phone] Subjective:   I Rhonda [name] am serving as a Education administrator for Dr. Hulan Saas.    CC:  Bilateral hand pain   [id number]   11/29/2018 Patient given injection.  Tolerated procedure well.  Discussed icing regimen and home exercise.  Discussed which activities to do which one to avoid.  Can repeat every 10 weeks.  07/21/2019 Rhonda [name] is a 63 y.o. female coming in with complaint of bilateral hand pain. No grip strength. Bilateral injections. [place].  Patient is having more pain.  Having difficulty even with grip strength at the moment.  Starting affecting job and waking her up at night.  Patient does not like to use the braces, does not want any surgical intervention.  Has been quite sometime since previous injections.     Past Medical History:  Diagnosis Date  . Anxiety   . Depression   . Encephalitis   . Hepatitis    ? B when age 71  . HTN (hypertension) 09/28/2013  . Hypertension   . Migraines   . Trigeminal neuralgia    Past Surgical History:  Procedure Laterality Date  . BRAIN SURGERY  2013   trigeminal neuralgia  . BREAST SURGERY     age 74-benign,  . COLONOSCOPY WITH PROPOFOL N/A 08/01/2013   Procedure: COLONOSCOPY WITH PROPOFOL;  Surgeon: Garlan Fair, MD;  Location: [facility];  Service: Endoscopy;  Laterality: N/A;  . CRANIOTOMY    . gamma knife     x1   Social History   Socioeconomic History  . Marital status: Married    Spouse name: Not on file  . Number of children: Not on file  . Years of education: Not on file  . Highest education level: Not on file  Occupational History  . Occupation: Nurse  Social Needs  . Financial resource strain: Not on file  . Food insecurity    Worry: Not on file    Inability: Not on file  . Transportation needs    Medical: Not on file    Non-medical: Not on file  Tobacco Use  . Smoking status: Never Smoker  .  Smokeless tobacco: Never Used  Substance and Sexual Activity  . Alcohol use: Yes    Comment: occassionally wine  . Drug use: No  . Sexual activity: Not on file  Lifestyle  . Physical activity    Days per week: Not on file    Minutes per session: Not on file  . Stress: Not on file  Relationships  . Social Herbalist on phone: Not on file    Gets together: Not on file    Attends religious service: Not on file    Active member of club or organization: Not on file    Attends meetings of clubs or organizations: Not on file    Relationship status: Not on file  Other Topics Concern  . Not on file  Social History Narrative   Lives with husband, Richardson Landry   Caffeine use: 2 cups coffee per day   Right handed    Allergies  Allergen Reactions  . Amlodipine Swelling  . Lisinopril Cough   Family History  Problem Relation Age of Onset  . Arthritis Mother   . Hyperlipidemia Mother   . Heart disease Mother   . Stroke Mother   . Hypertension Mother   . Diabetes Mother   . Arthritis Father   .  Hyperlipidemia Father   . Heart disease Father   . Stroke Father   . Hypertension Father   . Diabetes Father   . Cancer Maternal Grandmother   . Kidney disease Maternal Grandmother   . Mental illness Maternal Grandmother   . Hypertension Maternal Grandmother   . Heart disease Maternal Grandmother   . Cancer Maternal Grandfather   . Kidney disease Maternal Grandfather   . Mental illness Maternal Grandfather   . Hypertension Maternal Grandfather   . Heart disease Maternal Grandfather   . Cancer Paternal Grandmother   . Kidney disease Paternal Grandmother   . Mental illness Paternal Grandmother   . Hypertension Paternal Grandmother   . Heart disease Paternal Grandmother   . Cancer Paternal Grandfather   . Kidney disease Paternal Grandfather   . Mental illness Paternal Grandfather   . Hypertension Paternal Grandfather   . Heart disease Paternal Grandfather     Current  Outpatient Medications (Endocrine & Metabolic):  .  metFORMIN (GLUCOPHAGE) 500 MG tablet, Take 500 mg by mouth daily with breakfast. .  predniSONE (DELTASONE) 50 MG tablet, Take 1 tablet (50 mg total) by mouth daily.  Current Outpatient Medications (Cardiovascular):  .  amLODipine (NORVASC) 5 MG tablet, Take 5 mg by mouth daily. .  hydrochlorothiazide (HYDRODIURIL) 25 MG tablet, Take 1 tablet (25 mg total) by mouth daily. Marland Kitchen  losartan (COZAAR) 100 MG tablet, Take 100 mg by mouth daily. .  metoprolol succinate (TOPROL-XL) 100 MG 24 hr tablet, Take 100 mg by mouth every evening. Take with or immediately following a meal.  Current Outpatient Medications (Respiratory):  .  montelukast (SINGULAIR) 10 MG tablet, TAKE 1 TABLET BY MOUTH ONCE DAILY EVERY EVENING .  PROAIR HFA 108 (90 Base) MCG/ACT inhaler, Inhale 2 puffs into the lungs every 6 (six) hours as needed.  Current Outpatient Medications (Analgesics):  .  aspirin EC 81 MG tablet, Take 81 mg by mouth daily. .  Ibuprofen-Famotidine 800-26.6 MG TABS, Take 1 tablet 3 times daily. Marland Kitchen  oxyCODONE-acetaminophen (PERCOCET/ROXICET) 5-325 MG tablet, Take 1 tablet by mouth every 6 (six) hours as needed. for pain   Current Outpatient Medications (Other):  Marland Kitchen  ALPRAZolam (XANAX) 0.5 MG tablet, Take 0.5 mg by mouth every 6 (six) hours as needed. .  citalopram (CELEXA) 40 MG tablet, Take 40 mg by mouth daily. .  fish oil-omega-3 fatty acids 1000 MG capsule, Take 2 g by mouth daily. Marland Kitchen  gabapentin (NEURONTIN) 600 MG tablet, Take 300 mg by mouth 3 (three) times daily.  .  Glucosamine 500 MG CAPS, Take 1 capsule by mouth daily. Marland Kitchen  levETIRAcetam (KEPPRA) 500 MG tablet, Take 1 tablet (500 mg total) by mouth 2 (two) times daily. .  Multiple Vitamin (MULTIVITAMIN WITH MINERALS) TABS tablet, Take 1 tablet by mouth daily. .  ondansetron (ZOFRAN ODT) 4 MG disintegrating tablet, Take 1 tablet (4 mg total) by mouth every 8 (eight) hours as needed for nausea or  vomiting. .  Vitamin D, Ergocalciferol, (DRISDOL) 1.25 MG (50000 UT) CAPS capsule, Take 1 capsule (50,000 Units total) by mouth every 7 (seven) days. .  vitamin E 400 UNIT capsule, Take 400 Units by mouth daily.    Past medical history, social, surgical and family history all reviewed in electronic medical record.  No pertanent information unless stated regarding to the chief complaint.   Review of Systems:  No headache, visual changes, nausea, vomiting, diarrhea, constipation, dizziness, abdominal pain, skin rash, fevers, chills, night sweats, weight loss, swollen lymph  nodes, body aches, joint swelling,, chest pain, shortness of breath, mood changes.  Positive muscle aches  Objective  Blood pressure 120/82, pulse 74, height 5\' 2"  (1.575 m), SpO2 98 %.    General: No apparent distress alert and oriented x3 mood and affect normal, dressed appropriately.  HEENT: Pupils equal, extraocular movements intact  Respiratory: Patient's speak in full sentences and does not appear short of breath  Cardiovascular: No lower extremity edema, non tender, no erythema  Skin: Warm dry intact with no signs of infection or rash on extremities or on axial skeleton.  Abdomen: Soft nontender  Neuro: Cranial nerves II through XII are intact, neurovascularly intact in all extremities with 2+ DTRs and 2+ pulses.  Lymph: No lymphadenopathy of posterior or anterior cervical chain or axillae bilaterally.  Gait normal with good balance and coordination.  MSK:  Non tender with full range of motion and good stability and symmetric strength and tone of shoulders, elbows, , hip, knee and ankles bilaterally.  Bilateral hand exam shows the patient does have some mild tenderness to palpation over the [facility] joint.  Positive grind test.  No the loss of thenar eminence.  Procedure: Real-time Ultrasound Guided Injection of right CMC joint Device: GE Logiq Q7 Ultrasound guided injection is preferred based studies that show  increased duration, increased effect, greater accuracy, decreased procedural pain, increased response rate, and decreased cost with ultrasound guided versus blind injection.  Verbal informed consent obtained.  Time-out conducted.  Noted no overlying erythema, induration, or other signs of local infection.  Skin prepped in a sterile fashion.  Local anesthesia: Topical Ethyl chloride.  With sterile technique and under real time ultrasound guidance: 25-gauge 1/2 inch needle with 0.5cc of 0.5% marcaine and 0.5 cc of 40 mg/mL in [facility] joint  Completed without difficulty  Pain immediately resolved suggesting accurate placement of the medication.  Advised to call if fevers/chills, erythema, induration, drainage, or persistent bleeding.  Images permanently stored and available for review in the ultrasound unit.  Impression: Technically successful ultrasound guided injection.    Procedure: Real-time Ultrasound Guided Injection of left CMC joint Device: GE Logiq Q7 Ultrasound guided injection is preferred based studies that show increased duration, increased effect, greater accuracy, decreased procedural pain, increased response rate, and decreased cost with ultrasound guided versus blind injection.  Verbal informed consent obtained.  Time-out conducted.  Noted no overlying erythema, induration, or other signs of local infection.  Skin prepped in a sterile fashion.  Local anesthesia: Topical Ethyl chloride.  With sterile technique and under real time ultrasound guidance: With a 25-gauge half inch needle injected with 0.5 cc of 0.5% Marcaine and 0.5 cc of Kenalog 40 mg/mL Completed without difficulty   Pain immediately resolved suggesting accurate placement of the medication.  Advised to call if fevers/chills, erythema, induration, drainage, or persistent bleeding.  Images permanently stored and available for review in the ultrasound unit.  Impression: Technically successful ultrasound guided  injection.   Impression and Recommendations:     This case required medical decision making of moderate complexity. The above documentation has been reviewed and is accurate and complete Lyndal Pulley, DO       Note: This dictation was prepared with Dragon dictation along with smaller phrase technology. Any transcriptional errors that result from this process are unintentional.

## 2019-07-21 ENCOUNTER — Encounter: Payer: Self-pay | Admitting: Family Medicine

## 2019-07-21 ENCOUNTER — Ambulatory Visit: Payer: Self-pay

## 2019-07-21 ENCOUNTER — Ambulatory Visit (INDEPENDENT_AMBULATORY_CARE_PROVIDER_SITE_OTHER): Payer: PRIVATE HEALTH INSURANCE | Admitting: Family Medicine

## 2019-07-21 ENCOUNTER — Other Ambulatory Visit: Payer: Self-pay

## 2019-07-21 VITALS — BP 120/82 | HR 74 | Ht 62.0 in

## 2019-07-21 DIAGNOSIS — M19049 Primary osteoarthritis, unspecified hand: Secondary | ICD-10-CM | POA: Diagnosis not present

## 2019-07-21 DIAGNOSIS — M25532 Pain in left wrist: Secondary | ICD-10-CM

## 2019-07-21 DIAGNOSIS — M25531 Pain in right wrist: Secondary | ICD-10-CM

## 2019-07-21 NOTE — Assessment & Plan Note (Signed)
Bilateral injections given September 11.  Discussed icing stretching and home exercise bracing at night RTC in 12 weeks.

## 2019-08-09 DIAGNOSIS — E119 Type 2 diabetes mellitus without complications: Secondary | ICD-10-CM

## 2019-08-31 ENCOUNTER — Ambulatory Visit: Payer: PRIVATE HEALTH INSURANCE | Admitting: Family Medicine

## 2019-08-31 NOTE — Progress Notes (Deleted)
[name] [address] [place], [place] [address] Phone: [phone] Subjective:    I'm seeing this patient by the request  of:    CC:   [id number]   07/21/2019 Bilateral injections given September 11.  Discussed icing stretching and home exercise bracing at night RTC in 12 weeks.   Update 08/31/2019 Rhonda [name] is a 63 y.o. female coming in with complaint of bilateral knee pain. Last seen for bilateral wrist injections. Patient states   Onset-  Location Duration-  Character- Aggravating factors- Reliving factors-  Therapies tried-  Severity-     Past Medical History:  Diagnosis Date  . Anxiety   . Depression   . Encephalitis   . Hepatitis    ? B when age 89  . HTN (hypertension) 09/28/2013  . Hypertension   . Migraines   . Trigeminal neuralgia    Past Surgical History:  Procedure Laterality Date  . BRAIN SURGERY  2013   trigeminal neuralgia  . BREAST SURGERY     age 73-benign,  . COLONOSCOPY WITH PROPOFOL N/A 08/01/2013   Procedure: COLONOSCOPY WITH PROPOFOL;  Surgeon: Garlan Fair, MD;  Location: [facility];  Service: Endoscopy;  Laterality: N/A;  . CRANIOTOMY    . gamma knife     x1   Social History   Socioeconomic History  . Marital status: Married    Spouse name: Not on file  . Number of children: Not on file  . Years of education: Not on file  . Highest education level: Not on file  Occupational History  . Occupation: Nurse  Social Needs  . Financial resource strain: Not on file  . Food insecurity    Worry: Not on file    Inability: Not on file  . Transportation needs    Medical: Not on file    Non-medical: Not on file  Tobacco Use  . Smoking status: Never Smoker  . Smokeless tobacco: Never Used  Substance and Sexual Activity  . Alcohol use: Yes    Comment: occassionally wine  . Drug use: No  . Sexual activity: Not on file  Lifestyle  . Physical activity    Days per week: Not on file   Minutes per session: Not on file  . Stress: Not on file  Relationships  . Social Herbalist on phone: Not on file    Gets together: Not on file    Attends religious service: Not on file    Active member of club or organization: Not on file    Attends meetings of clubs or organizations: Not on file    Relationship status: Not on file  Other Topics Concern  . Not on file  Social History Narrative   Lives with husband, Richardson Landry   Caffeine use: 2 cups coffee per day   Right handed    Allergies  Allergen Reactions  . Amlodipine Swelling  . Lisinopril Cough   Family History  Problem Relation Age of Onset  . Arthritis Mother   . Hyperlipidemia Mother   . Heart disease Mother   . Stroke Mother   . Hypertension Mother   . Diabetes Mother   . Arthritis Father   . Hyperlipidemia Father   . Heart disease Father   . Stroke Father   . Hypertension Father   . Diabetes Father   . Cancer Maternal Grandmother   . Kidney disease Maternal Grandmother   . Mental illness Maternal Grandmother   .  Hypertension Maternal Grandmother   . Heart disease Maternal Grandmother   . Cancer Maternal Grandfather   . Kidney disease Maternal Grandfather   . Mental illness Maternal Grandfather   . Hypertension Maternal Grandfather   . Heart disease Maternal Grandfather   . Cancer Paternal Grandmother   . Kidney disease Paternal Grandmother   . Mental illness Paternal Grandmother   . Hypertension Paternal Grandmother   . Heart disease Paternal Grandmother   . Cancer Paternal Grandfather   . Kidney disease Paternal Grandfather   . Mental illness Paternal Grandfather   . Hypertension Paternal Grandfather   . Heart disease Paternal Grandfather     Current Outpatient Medications (Endocrine & Metabolic):  .  metFORMIN (GLUCOPHAGE) 500 MG tablet, Take 500 mg by mouth daily with breakfast. .  predniSONE (DELTASONE) 50 MG tablet, Take 1 tablet (50 mg total) by mouth daily.  Current Outpatient  Medications (Cardiovascular):  .  amLODipine (NORVASC) 5 MG tablet, Take 5 mg by mouth daily. .  hydrochlorothiazide (HYDRODIURIL) 25 MG tablet, Take 1 tablet (25 mg total) by mouth daily. Marland Kitchen  losartan (COZAAR) 100 MG tablet, Take 100 mg by mouth daily. .  metoprolol succinate (TOPROL-XL) 100 MG 24 hr tablet, Take 100 mg by mouth every evening. Take with or immediately following a meal.  Current Outpatient Medications (Respiratory):  .  montelukast (SINGULAIR) 10 MG tablet, TAKE 1 TABLET BY MOUTH ONCE DAILY EVERY EVENING .  PROAIR HFA 108 (90 Base) MCG/ACT inhaler, Inhale 2 puffs into the lungs every 6 (six) hours as needed.  Current Outpatient Medications (Analgesics):  .  aspirin EC 81 MG tablet, Take 81 mg by mouth daily. .  Ibuprofen-Famotidine 800-26.6 MG TABS, Take 1 tablet 3 times daily. Marland Kitchen  oxyCODONE-acetaminophen (PERCOCET/ROXICET) 5-325 MG tablet, Take 1 tablet by mouth every 6 (six) hours as needed. for pain   Current Outpatient Medications (Other):  Marland Kitchen  ALPRAZolam (XANAX) 0.5 MG tablet, Take 0.5 mg by mouth every 6 (six) hours as needed. .  citalopram (CELEXA) 40 MG tablet, Take 40 mg by mouth daily. .  fish oil-omega-3 fatty acids 1000 MG capsule, Take 2 g by mouth daily. Marland Kitchen  gabapentin (NEURONTIN) 600 MG tablet, Take 300 mg by mouth 3 (three) times daily.  .  Glucosamine 500 MG CAPS, Take 1 capsule by mouth daily. Marland Kitchen  levETIRAcetam (KEPPRA) 500 MG tablet, Take 1 tablet (500 mg total) by mouth 2 (two) times daily. .  Multiple Vitamin (MULTIVITAMIN WITH MINERALS) TABS tablet, Take 1 tablet by mouth daily. .  ondansetron (ZOFRAN ODT) 4 MG disintegrating tablet, Take 1 tablet (4 mg total) by mouth every 8 (eight) hours as needed for nausea or vomiting. .  Vitamin D, Ergocalciferol, (DRISDOL) 1.25 MG (50000 UT) CAPS capsule, Take 1 capsule (50,000 Units total) by mouth every 7 (seven) days. .  vitamin E 400 UNIT capsule, Take 400 Units by mouth daily.    Past medical history,  social, surgical and family history all reviewed in electronic medical record.  No pertanent information unless stated regarding to the chief complaint.   Review of Systems:  No headache, visual changes, nausea, vomiting, diarrhea, constipation, dizziness, abdominal pain, skin rash, fevers, chills, night sweats, weight loss, swollen lymph nodes, body aches, joint swelling, muscle aches, chest pain, shortness of breath, mood changes.   Objective  There were no vitals taken for this visit. Systems examined below as of    General: No apparent distress alert and oriented x3 mood and affect normal,  dressed appropriately.  HEENT: Pupils equal, extraocular movements intact  Respiratory: Patient's speak in full sentences and does not appear short of breath  Cardiovascular: No lower extremity edema, non tender, no erythema  Skin: Warm dry intact with no signs of infection or rash on extremities or on axial skeleton.  Abdomen: Soft nontender  Neuro: Cranial nerves II through XII are intact, neurovascularly intact in all extremities with 2+ DTRs and 2+ pulses.  Lymph: No lymphadenopathy of posterior or anterior cervical chain or axillae bilaterally.  Gait normal with good balance and coordination.  MSK:  Non tender with full range of motion and good stability and symmetric strength and tone of shoulders, elbows, wrist, hip, knee and ankles bilaterally.     Impression and Recommendations:     This case required medical decision making of moderate complexity. The above documentation has been reviewed and is accurate and complete Lyndal Pulley, DO       Note: This dictation was prepared with Dragon dictation along with smaller phrase technology. Any transcriptional errors that result from this process are unintentional.

## 2019-09-20 ENCOUNTER — Ambulatory Visit: Payer: PRIVATE HEALTH INSURANCE | Admitting: Family Medicine

## 2019-09-21 ENCOUNTER — Encounter: Payer: Self-pay | Admitting: Family Medicine

## 2019-09-21 ENCOUNTER — Ambulatory Visit (INDEPENDENT_AMBULATORY_CARE_PROVIDER_SITE_OTHER): Payer: PRIVATE HEALTH INSURANCE | Admitting: Family Medicine

## 2019-09-21 ENCOUNTER — Other Ambulatory Visit: Payer: Self-pay

## 2019-09-21 ENCOUNTER — Ambulatory Visit: Payer: Self-pay

## 2019-09-21 VITALS — BP 130/90 | HR 60 | Ht 62.0 in

## 2019-09-21 DIAGNOSIS — M170 Bilateral primary osteoarthritis of knee: Secondary | ICD-10-CM

## 2019-09-21 DIAGNOSIS — M25532 Pain in left wrist: Secondary | ICD-10-CM

## 2019-09-21 DIAGNOSIS — M19049 Primary osteoarthritis, unspecified hand: Secondary | ICD-10-CM

## 2019-09-21 DIAGNOSIS — M25531 Pain in right wrist: Secondary | ICD-10-CM

## 2019-09-21 NOTE — Assessment & Plan Note (Signed)
Repeat injections given today.  We discussed the possibility of PRP otherwise surgical intervention may be necessary.  Patient is in agreement with the plan.  We also discussed the possibility of a nerve conduction test to make sure that carpal tunnel is not causing some of the discomfort and pain.

## 2019-09-21 NOTE — Progress Notes (Signed)
[name] [address] [place], [place] [address] Phone: [phone] Subjective:   I Rhonda [name] am serving as a Education administrator for Dr. Hulan Saas.   CC: bilateral knee and bilateral thumb pain   [id number]   07/21/2019 Bilateral injections given September 11.  Discussed icing stretching and home exercise bracing at night RTC in 12 weeks.   09/20/2019 Rhonda [name] is a 63 y.o. female coming in with complaint of bilateral knee and wrist pain. Believes the wrist are not doing well. Having issues opening things.  Patient has had more of the [facility] arthritis and has had bilateral injections.  Worsening pain again.   Knee arthritis. Last injections visco in August of 2019. Now worsening pain some instability.  Hoping that she can get viscosupplementation again in the near future.     Past Medical History:  Diagnosis Date  . Anxiety   . Depression   . Encephalitis   . Hepatitis    ? B when age 60  . HTN (hypertension) 09/28/2013  . Hypertension   . Migraines   . Trigeminal neuralgia    Past Surgical History:  Procedure Laterality Date  . BRAIN SURGERY  2013   trigeminal neuralgia  . BREAST SURGERY     age 63-benign,  . COLONOSCOPY WITH PROPOFOL N/A 08/01/2013   Procedure: COLONOSCOPY WITH PROPOFOL;  Surgeon: Garlan Fair, MD;  Location: [facility];  Service: Endoscopy;  Laterality: N/A;  . CRANIOTOMY    . gamma knife     x1   Social History   Socioeconomic History  . Marital status: Married    Spouse name: Not on file  . Number of children: Not on file  . Years of education: Not on file  . Highest education level: Not on file  Occupational History  . Occupation: Nurse  Social Needs  . Financial resource strain: Not on file  . Food insecurity    Worry: Not on file    Inability: Not on file  . Transportation needs    Medical: Not on file    Non-medical: Not on file  Tobacco Use  . Smoking status: Never Smoker  . Smokeless  tobacco: Never Used  Substance and Sexual Activity  . Alcohol use: Yes    Comment: occassionally wine  . Drug use: No  . Sexual activity: Not on file  Lifestyle  . Physical activity    Days per week: Not on file    Minutes per session: Not on file  . Stress: Not on file  Relationships  . Social Herbalist on phone: Not on file    Gets together: Not on file    Attends religious service: Not on file    Active member of club or organization: Not on file    Attends meetings of clubs or organizations: Not on file    Relationship status: Not on file  Other Topics Concern  . Not on file  Social History Narrative   Lives with husband, Richardson Landry   Caffeine use: 2 cups coffee per day   Right handed    Allergies  Allergen Reactions  . Amlodipine Swelling  . Lisinopril Cough   Family History  Problem Relation Age of Onset  . Arthritis Mother   . Hyperlipidemia Mother   . Heart disease Mother   . Stroke Mother   . Hypertension Mother   . Diabetes Mother   . Arthritis Father   . Hyperlipidemia Father   .  Heart disease Father   . Stroke Father   . Hypertension Father   . Diabetes Father   . Cancer Maternal Grandmother   . Kidney disease Maternal Grandmother   . Mental illness Maternal Grandmother   . Hypertension Maternal Grandmother   . Heart disease Maternal Grandmother   . Cancer Maternal Grandfather   . Kidney disease Maternal Grandfather   . Mental illness Maternal Grandfather   . Hypertension Maternal Grandfather   . Heart disease Maternal Grandfather   . Cancer Paternal Grandmother   . Kidney disease Paternal Grandmother   . Mental illness Paternal Grandmother   . Hypertension Paternal Grandmother   . Heart disease Paternal Grandmother   . Cancer Paternal Grandfather   . Kidney disease Paternal Grandfather   . Mental illness Paternal Grandfather   . Hypertension Paternal Grandfather   . Heart disease Paternal Grandfather     Current Outpatient  Medications (Endocrine & Metabolic):  .  metFORMIN (GLUCOPHAGE) 500 MG tablet, Take 500 mg by mouth daily with breakfast. .  predniSONE (DELTASONE) 50 MG tablet, Take 1 tablet (50 mg total) by mouth daily.  Current Outpatient Medications (Cardiovascular):  .  amLODipine (NORVASC) 5 MG tablet, Take 5 mg by mouth daily. .  hydrochlorothiazide (HYDRODIURIL) 25 MG tablet, Take 1 tablet (25 mg total) by mouth daily. Marland Kitchen  losartan (COZAAR) 100 MG tablet, Take 100 mg by mouth daily. .  metoprolol succinate (TOPROL-XL) 100 MG 24 hr tablet, Take 100 mg by mouth every evening. Take with or immediately following a meal.  Current Outpatient Medications (Respiratory):  .  montelukast (SINGULAIR) 10 MG tablet, TAKE 1 TABLET BY MOUTH ONCE DAILY EVERY EVENING .  PROAIR HFA 108 (90 Base) MCG/ACT inhaler, Inhale 2 puffs into the lungs every 6 (six) hours as needed.  Current Outpatient Medications (Analgesics):  .  aspirin EC 81 MG tablet, Take 81 mg by mouth daily. .  Ibuprofen-Famotidine 800-26.6 MG TABS, Take 1 tablet 3 times daily. Marland Kitchen  oxyCODONE-acetaminophen (PERCOCET/ROXICET) 5-325 MG tablet, Take 1 tablet by mouth every 6 (six) hours as needed. for pain   Current Outpatient Medications (Other):  Marland Kitchen  ALPRAZolam (XANAX) 0.5 MG tablet, Take 0.5 mg by mouth every 6 (six) hours as needed. .  citalopram (CELEXA) 40 MG tablet, Take 40 mg by mouth daily. .  fish oil-omega-3 fatty acids 1000 MG capsule, Take 2 g by mouth daily. Marland Kitchen  gabapentin (NEURONTIN) 600 MG tablet, Take 300 mg by mouth 3 (three) times daily.  .  Glucosamine 500 MG CAPS, Take 1 capsule by mouth daily. Marland Kitchen  levETIRAcetam (KEPPRA) 500 MG tablet, Take 1 tablet (500 mg total) by mouth 2 (two) times daily. .  Multiple Vitamin (MULTIVITAMIN WITH MINERALS) TABS tablet, Take 1 tablet by mouth daily. .  ondansetron (ZOFRAN ODT) 4 MG disintegrating tablet, Take 1 tablet (4 mg total) by mouth every 8 (eight) hours as needed for nausea or vomiting. .   Vitamin D, Ergocalciferol, (DRISDOL) 1.25 MG (50000 UT) CAPS capsule, Take 1 capsule (50,000 Units total) by mouth every 7 (seven) days. .  vitamin E 400 UNIT capsule, Take 400 Units by mouth daily.    Past medical history, social, surgical and family history all reviewed in electronic medical record.  No pertanent information unless stated regarding to the chief complaint.   Review of Systems:  No headache, visual changes, nausea, vomiting, diarrhea, constipation, dizziness, abdominal pain, skin rash, fevers, chills, night sweats, weight loss, swollen lymph nodes, body aches, joint swelling,  chest pain, shortness of breath, mood changes.  Positive muscle aches  Objective  Blood pressure 130/90, pulse 60, height 5\' 2"  (1.575 m), SpO2 98 %.    General: No apparent distress alert and oriented x3 mood and affect normal, dressed appropriately.  HEENT: Pupils equal, extraocular movements intact  Respiratory: Patient's speak in full sentences and does not appear short of breath  Cardiovascular: No lower extremity edema, non tender, no erythema  Skin: Warm dry intact with no signs of infection or rash on extremities or on axial skeleton.  Abdomen: Soft nontender  Neuro: Cranial nerves II through XII are intact, neurovascularly intact in all extremities with 2+ DTRs and 2+ pulses.  Lymph: No lymphadenopathy of posterior or anterior cervical chain or axillae bilaterally.  Gait normal with good balance and coordination.  MSK:  Non tender with full range of motion and good stability and symmetric strength and tone of shoulders, elbows,  hip and ankles bilaterally.  Knee: Bilateral valgus deformity noted.  Abnormal thigh to calf ratio.  Tender to palpation over medial and PF joint line.  ROM full in flexion and extension and lower leg rotation. instability with valgus force.  painful patellar compression. Patellar glide with moderate crepitus. Patellar and quadriceps tendons unremarkable.  Hamstring and quadriceps strength is normal.  Patient has bilateral lipomas does have tender to palpation.  More over the [facility] joint.  Positive grind test.  No thenar eminence wasting noted noted yet.  After informed written and verbal consent, patient was seated on exam table. Right knee was prepped with alcohol swab and utilizing anterolateral approach, patient's right knee space was injected with 4:1  marcaine 0.5%: Kenalog 40mg /dL. Patient tolerated the procedure well without immediate complications.  After informed written and verbal consent, patient was seated on exam table. Left knee was prepped with alcohol swab and utilizing anterolateral approach, patient's left knee space was injected with 4:1  marcaine 0.5%: Kenalog 40mg /dL. Patient tolerated the procedure well without immediate complications.  Procedure: Real-time Ultrasound Guided Injection of right CMC joint Device: GE Logiq Q7 Ultrasound guided injection is preferred based studies that show increased duration, increased effect, greater accuracy, decreased procedural pain, increased response rate, and decreased cost with ultrasound guided versus blind injection.  Verbal informed consent obtained.  Time-out conducted.  Noted no overlying erythema, induration, or other signs of local infection.  Skin prepped in a sterile fashion.  Local anesthesia: Topical Ethyl chloride.  With sterile technique and under real time ultrasound guidance: With a 25-gauge half inch needle with good 0.5 cc of 0.5% Marcaine and 0.5 cc of Kenalog 40 mg/mL Completed without difficulty  Pain immediately resolved suggesting accurate placement of the medication.  Advised to call if fevers/chills, erythema, induration, drainage, or persistent bleeding.  Images permanently stored and available for review in the ultrasound unit.  Impression: Technically successful ultrasound guided injection.  Procedure: Real-time Ultrasound Guided Injection of left CMC joint  Device: GE Logiq Q7 Ultrasound guided injection is preferred based studies that show increased duration, increased effect, greater accuracy, decreased procedural pain, increased response rate, and decreased cost with ultrasound guided versus blind injection.  Verbal informed consent obtained.  Time-out conducted.  Noted no overlying erythema, induration, or other signs of local infection.  Skin prepped in a sterile fashion.  Local anesthesia: Topical Ethyl chloride.  With sterile technique and under real time ultrasound guidance: With a 25-gauge half inch needle injected with 0.5 cc of 0.5% Marcaine and 0.5 cc of Kenalog 40 mg/mL Completed  without difficulty  Pain immediately resolved suggesting accurate placement of the medication.   Advised to call if fevers/chills, erythema, induration, drainage, or persistent bleeding.  Images permanently stored and available for review in the ultrasound unit.  Impression: Technically successful ultrasound guided injection.      Impression and Recommendations:     This case required medical decision making of moderate complexity. The above documentation has been reviewed and is accurate and complete Lyndal Pulley, DO       Note: This dictation was prepared with Dragon dictation along with smaller phrase technology. Any transcriptional errors that result from this process are unintentional.

## 2019-09-21 NOTE — Assessment & Plan Note (Signed)
Bilateral injections given.  Discussed icing regimen and home exercises.  Projected exam which was to avoid.  Patient will increase activity slowly.  Candidate for viscosupplementation and doing well and we will try to get patient approved for it as well follow-up after approval

## 2019-09-29 ENCOUNTER — Other Ambulatory Visit: Payer: Self-pay

## 2019-09-29 MED ORDER — VITAMIN D (ERGOCALCIFEROL) 1.25 MG (50000 UNIT) PO CAPS: 50000.0000 [IU] | ORAL_CAPSULE | ORAL | 0 refills | Status: DC

## 2019-09-29 MED ORDER — IBUPROFEN 800 MG PO TABS: 800.0000 mg | ORAL_TABLET | Freq: Three times a day (TID) | ORAL | 0 refills | Status: DC | PRN

## 2019-09-29 MED ORDER — PREDNISONE 50 MG PO TABS: ORAL_TABLET | ORAL | 0 refills | Status: DC

## 2019-09-29 MED ORDER — IBUPROFEN 800 MG PO TABS: 800.0000 mg | ORAL_TABLET | Freq: Three times a day (TID) | ORAL | 0 refills | Status: AC | PRN

## 2019-10-11 ENCOUNTER — Other Ambulatory Visit: Payer: Self-pay

## 2019-10-11 ENCOUNTER — Ambulatory Visit: Payer: PRIVATE HEALTH INSURANCE | Admitting: Family Medicine

## 2019-10-11 ENCOUNTER — Encounter: Payer: Self-pay | Admitting: Family Medicine

## 2019-10-11 DIAGNOSIS — M170 Bilateral primary osteoarthritis of knee: Secondary | ICD-10-CM

## 2019-10-11 NOTE — Assessment & Plan Note (Signed)
Bilateral viscosupplementation today.  Tolerated the procedure well.  Patient has had good work with this previously.  Hoping patient will have another improvement with this point discussed and will take about 4 weeks time to work.  Discussed icing regimen and home exercises.

## 2019-10-11 NOTE — Progress Notes (Signed)
[name] [address] [place], [place] [address] Phone: [phone] Subjective:   I Rhonda [name] am serving as a Education administrator for Dr. Hulan Saas.  This visit occurred during the SARS-CoV-2 public health emergency.  Safety protocols were in place, including screening questions prior to the visit, additional usage of staff PPE, and extensive cleaning of exam room while observing appropriate contact time as indicated for disinfecting solutions.    CC: Bilateral knee pain  [id number]   09/21/2019 Bilateral injections given.  Discussed icing regimen and home exercises.  Projected exam which was to avoid.  Patient will increase activity slowly.  Candidate for viscosupplementation and doing well and we will try to get patient approved for it as well follow-up after approval   Repeat injections given today.  We discussed the possibility of PRP otherwise surgical intervention may be necessary.  Patient is in agreement with the plan.  We also discussed the possibility of a nerve conduction test to make sure that carpal tunnel is not causing some of the discomfort and pain.  10/11/2019 Rhonda [name] is a 63 y.o. female coming in with complaint of bilateral knee and wrist pain. Monovisc injection today.  Patient has been doing okay but did not have significant improvement with the steroid injections.  States that it affects her daily activity as well as her work.  Patient rates the severity of pain is 9 out of 10.     Past Medical History:  Diagnosis Date   Anxiety    Depression    Encephalitis    Hepatitis    ? B when age 89   HTN (hypertension) 09/28/2013   Hypertension    Migraines    Trigeminal neuralgia    Past Surgical History:  Procedure Laterality Date   BRAIN SURGERY  2013   trigeminal neuralgia   BREAST SURGERY     age 53-benign,   COLONOSCOPY WITH PROPOFOL N/A 08/01/2013   Procedure: COLONOSCOPY WITH PROPOFOL;  Surgeon: Garlan Fair, MD;  Location: [facility];  Service: Endoscopy;  Laterality: N/A;   CRANIOTOMY     gamma knife     x1   Social History   Socioeconomic History   Marital status: Married    Spouse name: Not on file   Number of children: Not on file   Years of education: Not on file   Highest education level: Not on file  Occupational History   Occupation: Nurse  Social Needs   Financial resource strain: Not on file   Food insecurity    Worry: Not on file    Inability: Not on file   Transportation needs    Medical: Not on file    Non-medical: Not on file  Tobacco Use   Smoking status: Never Smoker   Smokeless tobacco: Never Used  Substance and Sexual Activity   Alcohol use: Yes    Comment: occassionally wine   Drug use: No   Sexual activity: Not on file  Lifestyle   Physical activity    Days per week: Not on file    Minutes per session: Not on file   Stress: Not on file  Relationships   Social connections    Talks on phone: Not on file    Gets together: Not on file    Attends religious service: Not on file    Active member of club or organization: Not on file    Attends meetings of clubs or organizations: Not on  file    Relationship status: Not on file  Other Topics Concern   Not on file  Social History Narrative   Lives with husband, Richardson Landry   Caffeine use: 2 cups coffee per day   Right handed    Allergies  Allergen Reactions   Amlodipine Swelling   Lisinopril Cough   Family History  Problem Relation Age of Onset   Arthritis Mother    Hyperlipidemia Mother    Heart disease Mother    Stroke Mother    Hypertension Mother    Diabetes Mother    Arthritis Father    Hyperlipidemia Father    Heart disease Father    Stroke Father    Hypertension Father    Diabetes Father    Cancer Maternal Grandmother    Kidney disease Maternal Grandmother    Mental illness Maternal Grandmother    Hypertension Maternal Grandmother    Heart  disease Maternal Grandmother    Cancer Maternal Grandfather    Kidney disease Maternal Grandfather    Mental illness Maternal Grandfather    Hypertension Maternal Grandfather    Heart disease Maternal Grandfather    Cancer Paternal Grandmother    Kidney disease Paternal Grandmother    Mental illness Paternal Grandmother    Hypertension Paternal Grandmother    Heart disease Paternal Grandmother    Cancer Paternal Grandfather    Kidney disease Paternal Grandfather    Mental illness Paternal Grandfather    Hypertension Paternal Grandfather    Heart disease Paternal Grandfather     Current Outpatient Medications (Endocrine & Metabolic):    metFORMIN (GLUCOPHAGE) 500 MG tablet, Take 500 mg by mouth daily with breakfast.   predniSONE (DELTASONE) 50 MG tablet, 1 tablet by mouth daily  Current Outpatient Medications (Cardiovascular):    amLODipine (NORVASC) 5 MG tablet, Take 5 mg by mouth daily.   hydrochlorothiazide (HYDRODIURIL) 25 MG tablet, Take 1 tablet (25 mg total) by mouth daily.   losartan (COZAAR) 100 MG tablet, Take 100 mg by mouth daily.   metoprolol succinate (TOPROL-XL) 100 MG 24 hr tablet, Take 100 mg by mouth every evening. Take with or immediately following a meal.  Current Outpatient Medications (Respiratory):    montelukast (SINGULAIR) 10 MG tablet, TAKE 1 TABLET BY MOUTH ONCE DAILY EVERY EVENING   PROAIR HFA 108 (90 Base) MCG/ACT inhaler, Inhale 2 puffs into the lungs every 6 (six) hours as needed.  Current Outpatient Medications (Analgesics):    aspirin EC 81 MG tablet, Take 81 mg by mouth daily.   ibuprofen (ADVIL) 800 MG tablet, Take 1 tablet (800 mg total) by mouth every 8 (eight) hours as needed.   Ibuprofen-Famotidine 800-26.6 MG TABS, Take 1 tablet 3 times daily.   oxyCODONE-acetaminophen (PERCOCET/ROXICET) 5-325 MG tablet, Take 1 tablet by mouth every 6 (six) hours as needed. for pain   Current Outpatient Medications (Other):     ALPRAZolam (XANAX) 0.5 MG tablet, Take 0.5 mg by mouth every 6 (six) hours as needed.   citalopram (CELEXA) 40 MG tablet, Take 40 mg by mouth daily.   fish oil-omega-3 fatty acids 1000 MG capsule, Take 2 g by mouth daily.   gabapentin (NEURONTIN) 600 MG tablet, Take 300 mg by mouth 3 (three) times daily.    Glucosamine 500 MG CAPS, Take 1 capsule by mouth daily.   levETIRAcetam (KEPPRA) 500 MG tablet, Take 1 tablet (500 mg total) by mouth 2 (two) times daily.   Multiple Vitamin (MULTIVITAMIN WITH MINERALS) TABS tablet, Take 1 tablet by  mouth daily.   ondansetron (ZOFRAN ODT) 4 MG disintegrating tablet, Take 1 tablet (4 mg total) by mouth every 8 (eight) hours as needed for nausea or vomiting.   Vitamin D, Ergocalciferol, (DRISDOL) 1.25 MG (50000 UT) CAPS capsule, Take 1 capsule (50,000 Units total) by mouth every 7 (seven) days.   vitamin E 400 UNIT capsule, Take 400 Units by mouth daily.    Past medical history, social, surgical and family history all reviewed in electronic medical record.  No pertanent information unless stated regarding to the chief complaint.   Review of Systems:  No headache, visual changes, nausea, vomiting, diarrhea, constipation, dizziness, abdominal pain, skin rash, fevers, chills, night sweats, weight loss, swollen lymph nodes, body aches, joint swelling, chest pain, shortness of breath, mood changes.  Positive muscle aches  Objective  Blood pressure 140/90, pulse 66, height 5\' 2"  (1.575 m), SpO2 98 %.     General: No apparent distress alert and oriented x3 mood and affect normal, dressed appropriately.  HEENT: Pupils equal, extraocular movements intact  Respiratory: Patient's speak in full sentences and does not appear short of breath  Cardiovascular: No lower extremity edema, non tender, no erythema  Skin: Warm dry intact with no signs of infection or rash on extremities or on axial skeleton.  Abdomen: Soft nontender  Neuro: Cranial nerves II through  XII are intact, neurovascularly intact in all extremities with 2+ DTRs and 2+ pulses.  Lymph: No lymphadenopathy of posterior or anterior cervical chain or axillae bilaterally.  Gait mild antalgic MSK:  tender with limited range of motion and good stability and symmetric strength and tone of shoulders, elbows, wrist, hip, and ankles bilaterally.  Knee: Bilateral valgus deformity noted.  Abnormal thigh to calf ratio.  Tender to palpation over medial and PF joint line.  ROM full in flexion and extension and lower leg rotation. instability with valgus force.  painful patellar compression. Patellar glide with moderate crepitus. Patellar and quadriceps tendons unremarkable. Hamstring and quadriceps strength is normal.   After informed written and verbal consent, patient was seated on exam table. Right knee was prepped with alcohol swab and utilizing anterolateral approach, patient's right knee space was injected with 22 mg/mL of Monovisc (sodium hyaluronate) in a prefilled syringe was injected easily into the knee through a 22-gauge needle..Patient tolerated the procedure well without immediate complications.  After informed written and verbal consent, patient was seated on exam table. Left knee was prepped with alcohol swab and utilizing anterolateral approach, patient's left knee space was injected with 22 mg/mL of Monovisc. Patient tolerated the procedure well without immediate complications.   Impression and Recommendations:     This case required medical decision making of moderate complexity. The above documentation has been reviewed and is accurate and complete Lyndal Pulley, DO       Note: This dictation was prepared with Dragon dictation along with smaller phrase technology. Any transcriptional errors that result from this process are unintentional.

## 2019-10-11 NOTE — Patient Instructions (Signed)
Good to see you   

## 2019-10-13 ENCOUNTER — Ambulatory Visit (INDEPENDENT_AMBULATORY_CARE_PROVIDER_SITE_OTHER): Payer: PRIVATE HEALTH INSURANCE | Admitting: Family Medicine

## 2019-10-13 ENCOUNTER — Ambulatory Visit (INDEPENDENT_AMBULATORY_CARE_PROVIDER_SITE_OTHER)
Admission: RE | Admit: 2019-10-13 | Discharge: 2019-10-13 | Disposition: A | Discharge status: Home / Self Care | Payer: PRIVATE HEALTH INSURANCE | Source: Ambulatory Visit | Attending: Family Medicine | Admitting: Family Medicine

## 2019-10-13 ENCOUNTER — Encounter: Payer: Self-pay | Admitting: Family Medicine

## 2019-10-13 ENCOUNTER — Other Ambulatory Visit: Payer: Self-pay

## 2019-10-13 ENCOUNTER — Telehealth: Payer: Self-pay

## 2019-10-13 VITALS — BP 140/90 | HR 62 | Ht 62.0 in

## 2019-10-13 DIAGNOSIS — S134XXA Sprain of ligaments of cervical spine, initial encounter: Secondary | ICD-10-CM | POA: Diagnosis not present

## 2019-10-13 DIAGNOSIS — M542 Cervicalgia: Secondary | ICD-10-CM | POA: Diagnosis not present

## 2019-10-13 DIAGNOSIS — M545 Low back pain, unspecified: Secondary | ICD-10-CM

## 2019-10-13 MED ORDER — TIZANIDINE HCL 4 MG PO TABS: 4.0000 mg | ORAL_TABLET | Freq: Every day | ORAL | 0 refills | Status: DC

## 2019-10-13 MED ORDER — KETOROLAC TROMETHAMINE 60 MG/2ML IM SOLN
60.0000 mg | Freq: Once | INTRAMUSCULAR | Status: AC
  Administered 2019-10-13: 12:00:00 60 mg via INTRAMUSCULAR

## 2019-10-13 MED ORDER — PREDNISONE 50 MG PO TABS: ORAL_TABLET | ORAL | 0 refills | Status: DC

## 2019-10-13 MED ORDER — METHYLPREDNISOLONE ACETATE 80 MG/ML IJ SUSP
80.0000 mg | Freq: Once | INTRAMUSCULAR | Status: AC
  Administered 2019-10-13: 80 mg via INTRAMUSCULAR

## 2019-10-13 MED FILL — predniSONE 50 MG TABS: 50 | 5 days supply | Qty: 5 | Fill #0

## 2019-10-13 MED FILL — tiZANidine HCL 4 MG TABS: 4 | 30 days supply | Qty: 30 | Fill #0
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
## 2019-10-13 NOTE — Telephone Encounter (Signed)
Returned patient call. Scheduled today.
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
## 2019-10-13 NOTE — Patient Instructions (Addendum)
Zanaflex at night Prednisone 1 daily for 5 days Injections today for pain Send me a message on Monday

## 2019-11-30 DIAGNOSIS — F419 Anxiety disorder, unspecified: Secondary | ICD-10-CM | POA: Diagnosis not present

## 2019-11-30 DIAGNOSIS — H9210 Otorrhea, unspecified ear: Secondary | ICD-10-CM | POA: Diagnosis not present

## 2019-11-30 DIAGNOSIS — I10 Essential (primary) hypertension: Secondary | ICD-10-CM | POA: Diagnosis not present

## 2019-11-30 DIAGNOSIS — J45909 Unspecified asthma, uncomplicated: Secondary | ICD-10-CM | POA: Diagnosis not present

## 2019-12-20 DIAGNOSIS — G500 Trigeminal neuralgia: Secondary | ICD-10-CM | POA: Diagnosis not present

## 2019-12-25 DIAGNOSIS — Z923 Personal history of irradiation: Secondary | ICD-10-CM | POA: Diagnosis not present

## 2019-12-25 DIAGNOSIS — G500 Trigeminal neuralgia: Secondary | ICD-10-CM | POA: Diagnosis not present

## 2020-02-07 DIAGNOSIS — G500 Trigeminal neuralgia: Secondary | ICD-10-CM | POA: Diagnosis not present

## 2020-02-08 DIAGNOSIS — G500 Trigeminal neuralgia: Secondary | ICD-10-CM | POA: Diagnosis not present

## 2020-02-08 DIAGNOSIS — Z510 Encounter for antineoplastic radiation therapy: Secondary | ICD-10-CM | POA: Diagnosis not present

## 2020-02-26 IMAGING — MG DIGITAL DIAGNOSTIC UNILATERAL LEFT MAMMOGRAM WITH TOMO AND CAD
4 series · 4 of 12 positions shown · non-contrast
Comparison: Previous exam(s).

CLINICAL DATA: Follow-up probably benign mass in the 8 o'clock
position of the left breast.

EXAM:
DIGITAL DIAGNOSTIC LEFT MAMMOGRAM WITH CAD AND TOMO
ULTRASOUND LEFT BREAST

[L CC synth-2D]
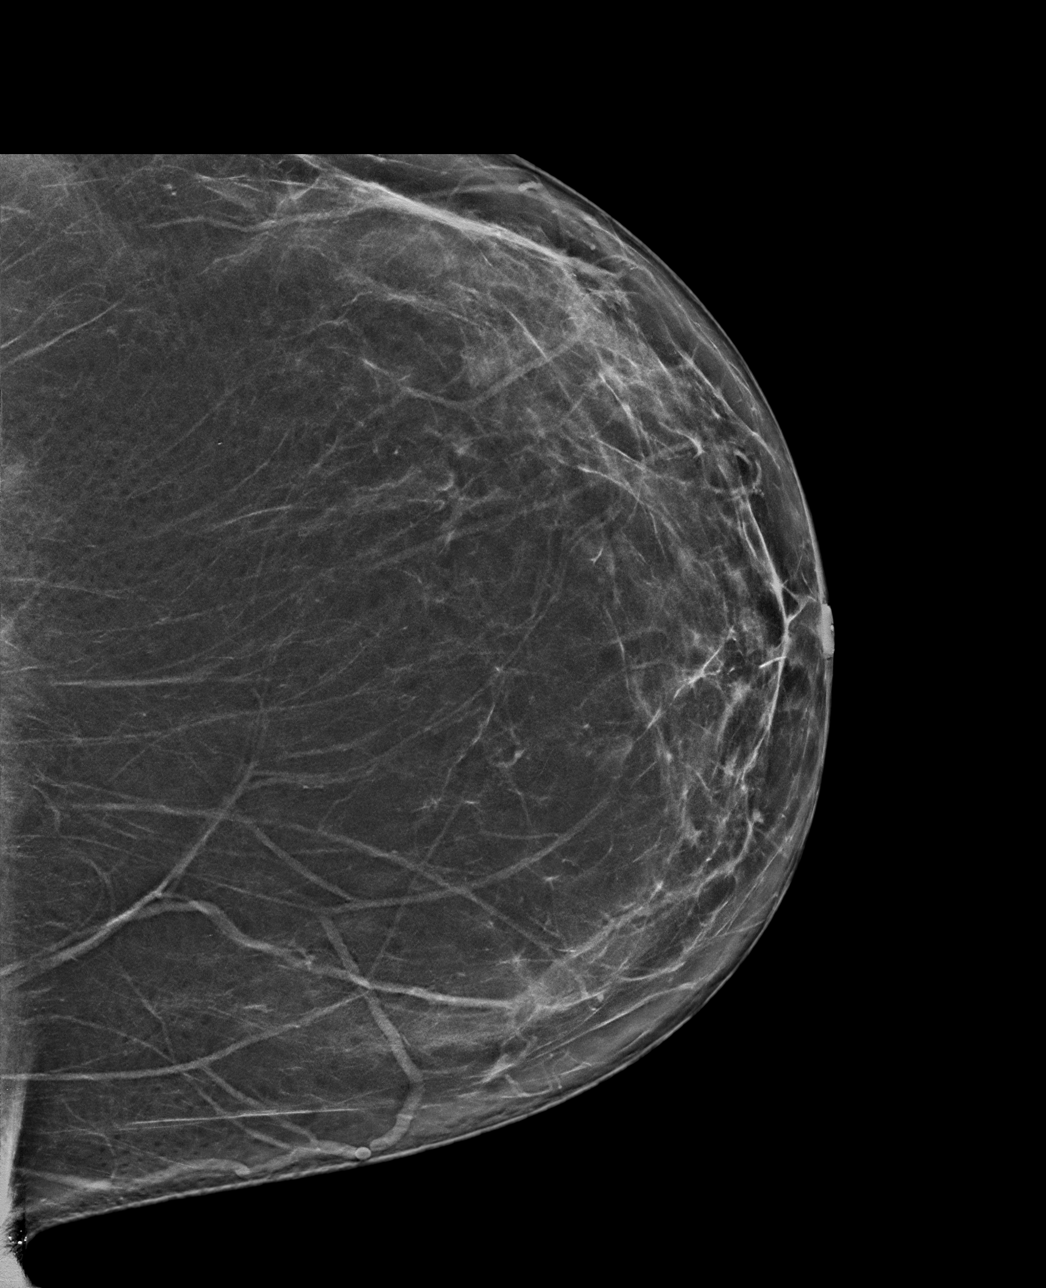

[L MLO synth-2D]
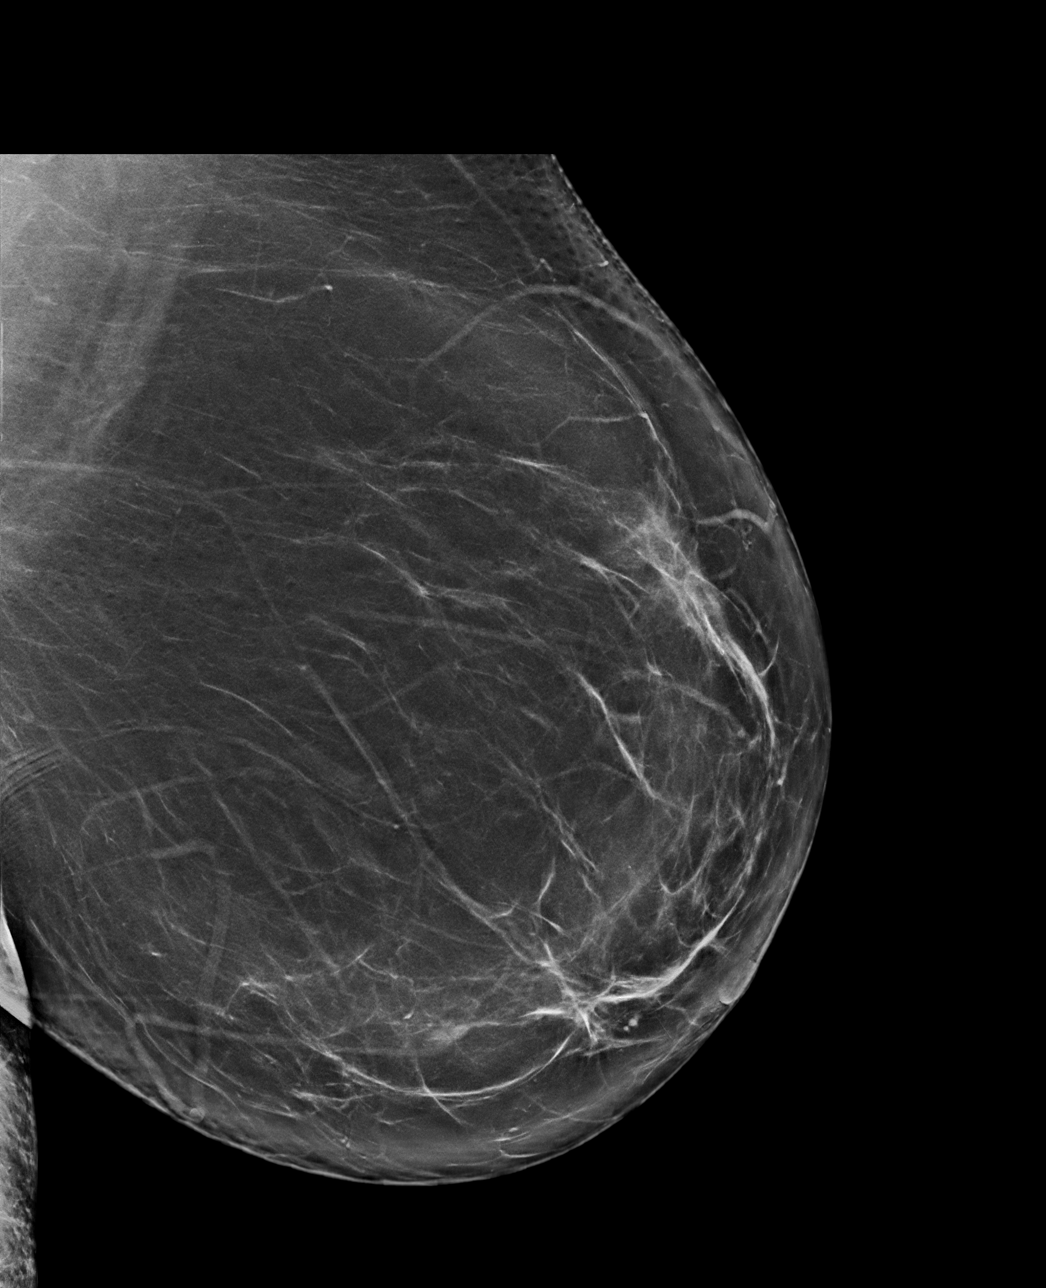

[L MLO tomo · tomo slice 49/96.0]
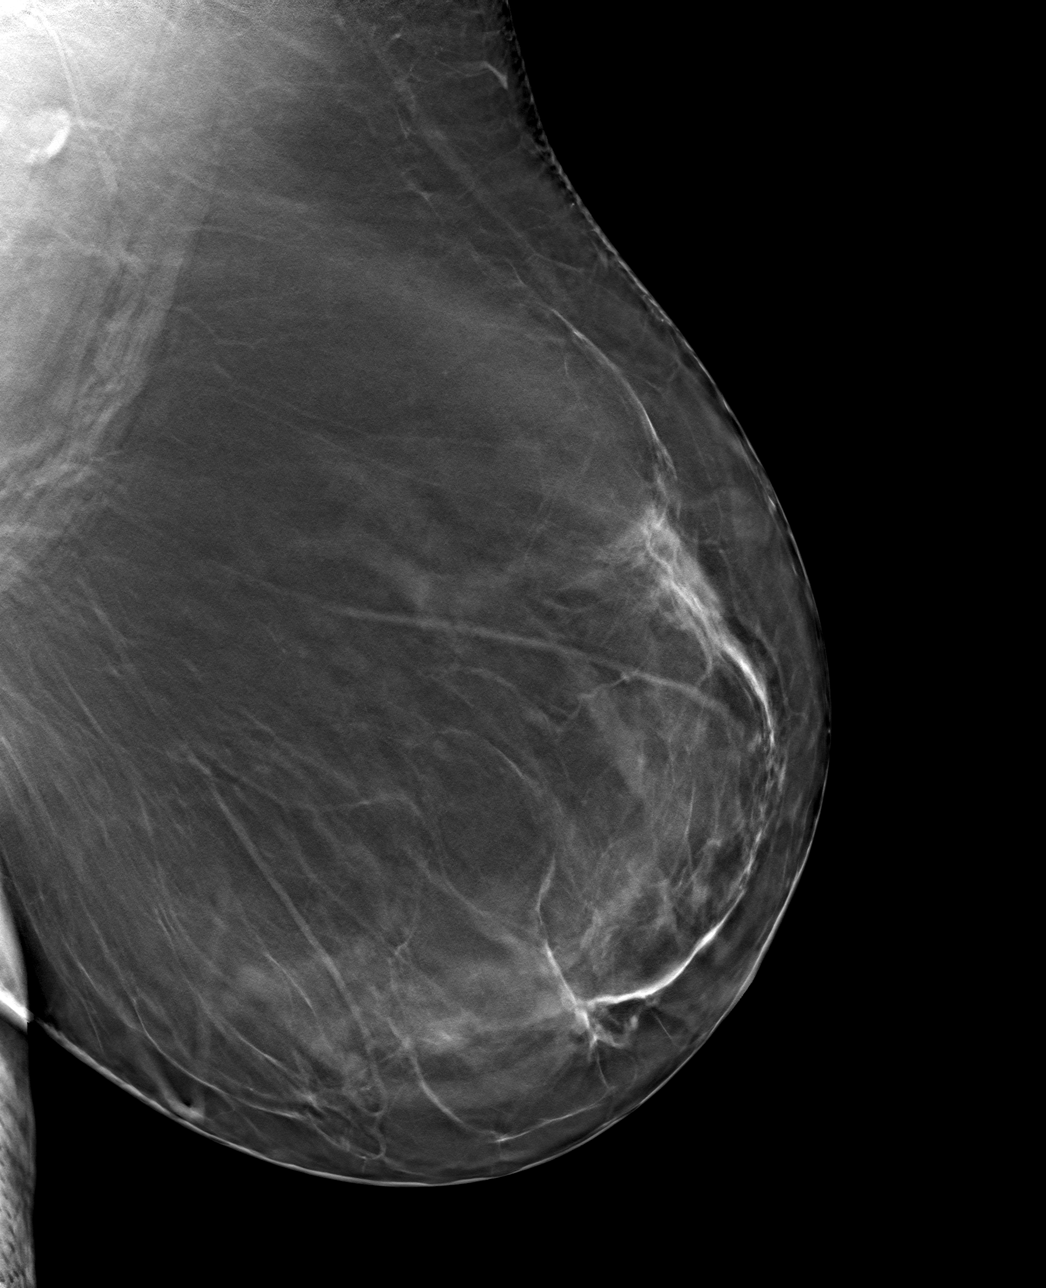

[L CC tomo · tomo slice 43/84.0]
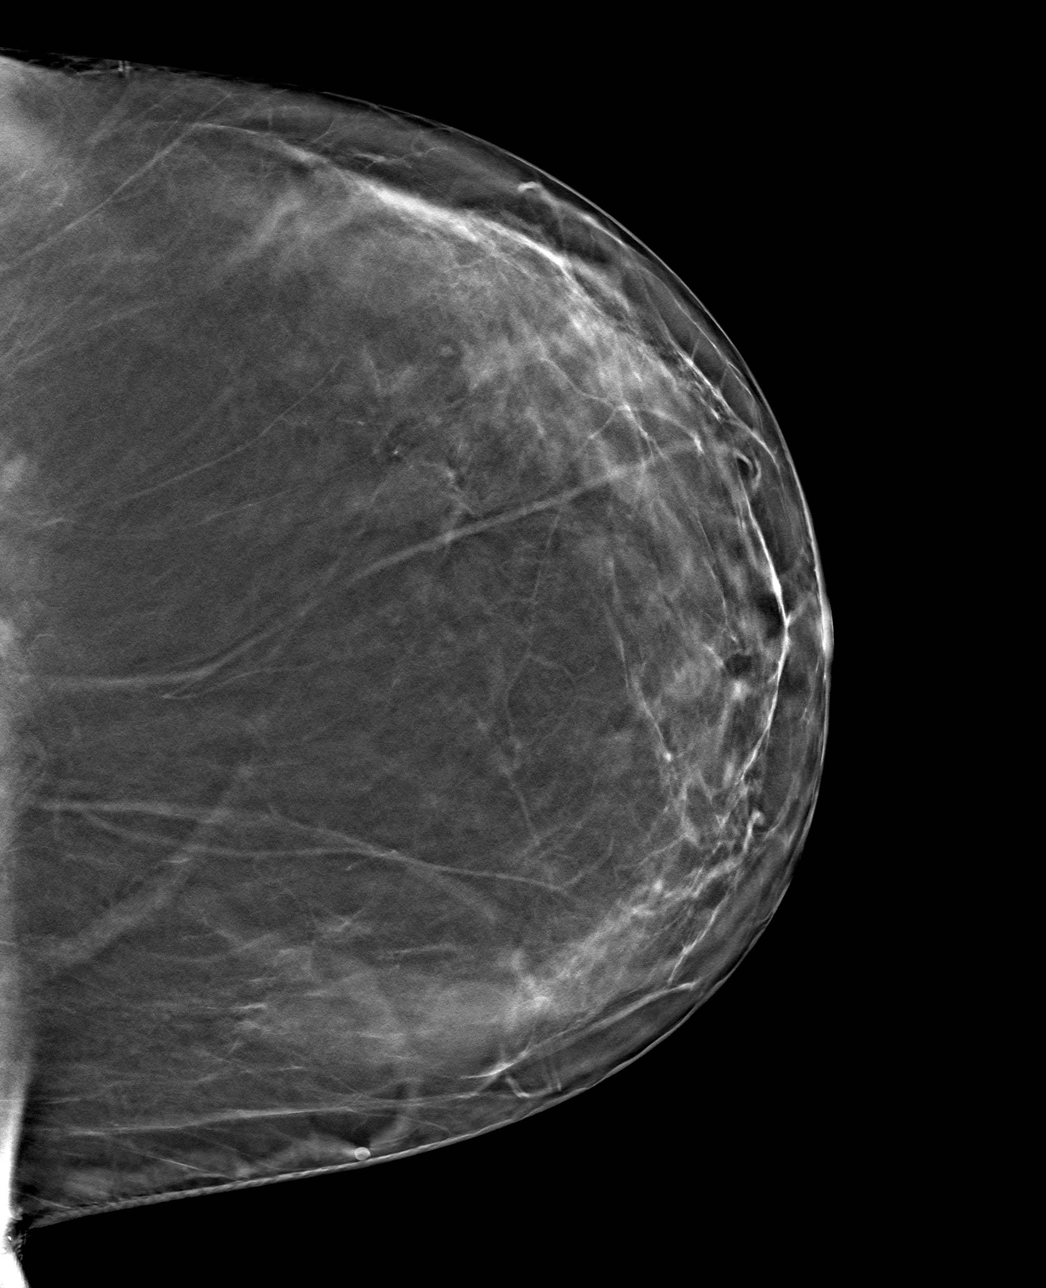

[4 of 12 positions shown; findings below may reference images not displayed]

ACR Breast Density Category b: There are scattered areas of
fibroglandular density.
FINDINGS: The previously demonstrated small, oval, circumscribed mass in the
anterior aspect of the lower inner quadrant of the left breast
appears slightly smaller and less dense today. No interval findings
suspicious for malignancy in the left breast.

Mammographic images were processed with CAD.

Targeted ultrasound is performed, showing a 9 x 7 x 2 mm oval,
horizontally oriented circumscribed, hypoechoic mass in the 8
o'clock position of the left breast, 2 cm from the nipple. This
measured 8 x 7 x 2 mm on 03/08/2018.
IMPRESSION: Stable small probably benign mass in the 8 o'clock position of the
left breast, most likely representing a mildly complicated cyst.

RECOMMENDATION:
Bilateral diagnostic mammogram and left breast ultrasound in 3
months. That will be 1 year since mammographic evaluation of the
right breast.

I have discussed the findings and recommendations with the patient.
Results were also provided in writing at the conclusion of the
visit. If applicable, a reminder letter will be sent to the patient
regarding the next appointment.

BI-RADS CATEGORY  3: Probably benign.

## 2020-02-26 IMAGING — US ULTRASOUND LEFT BREAST LIMITED
1 series · 5 of 5 positions shown · non-contrast
Comparison: Previous exam(s).

CLINICAL DATA: Follow-up probably benign mass in the 8 o'clock
position of the left breast.

EXAM:
DIGITAL DIAGNOSTIC LEFT MAMMOGRAM WITH CAD AND TOMO
ULTRASOUND LEFT BREAST

[Series 1: ultrasound left breast limited · 0.07mm/px · 5 of 5 slices shown]
[im 1/5]
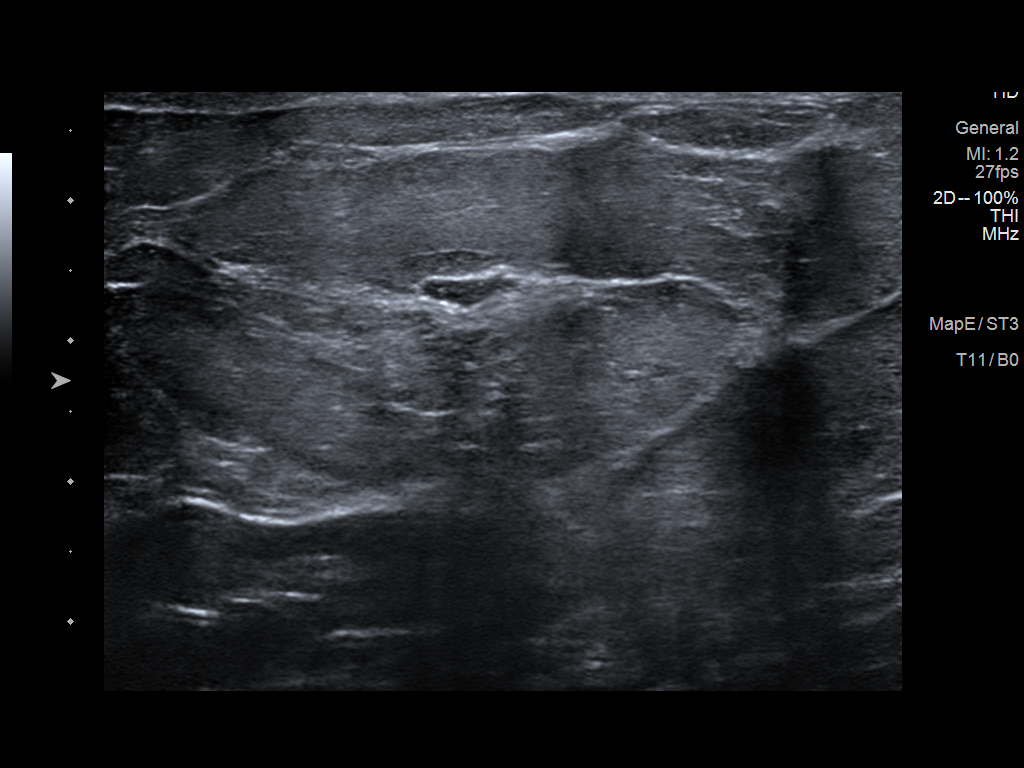
[im 2/5]
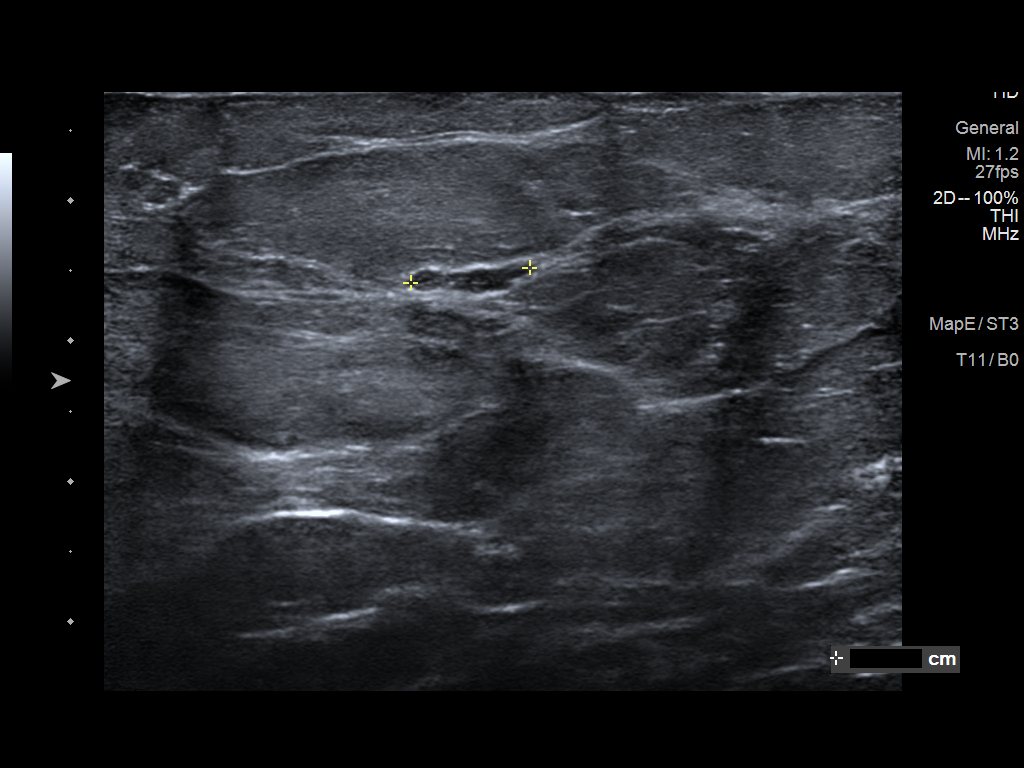
[im 3/5]
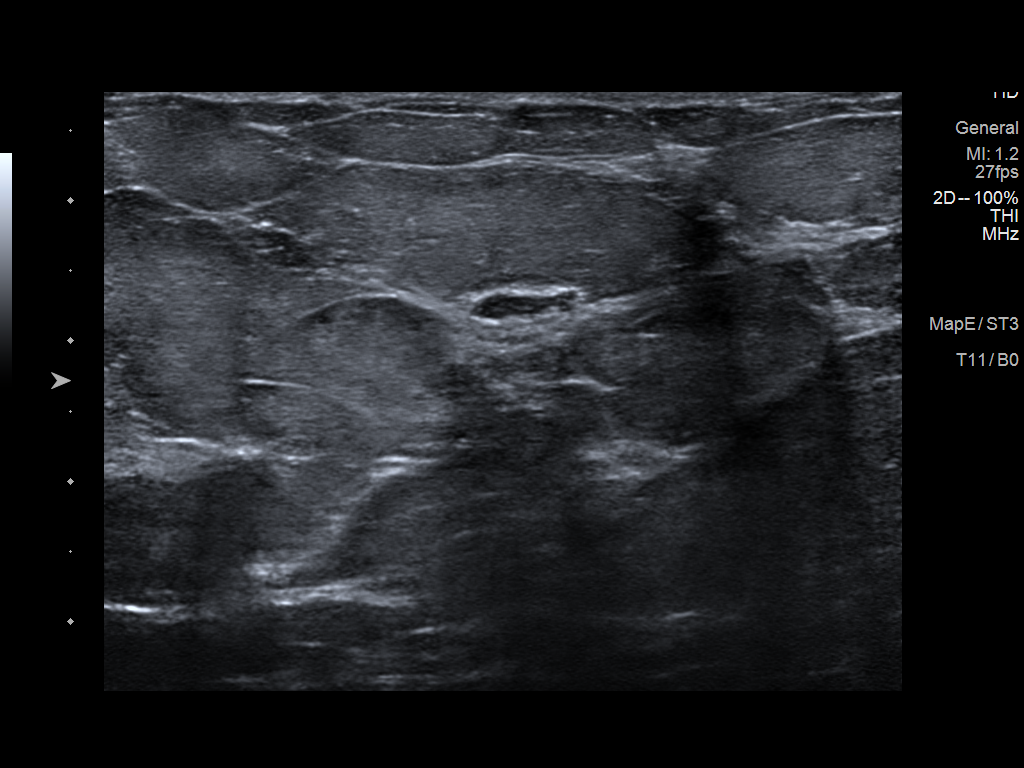
[im 4/5]
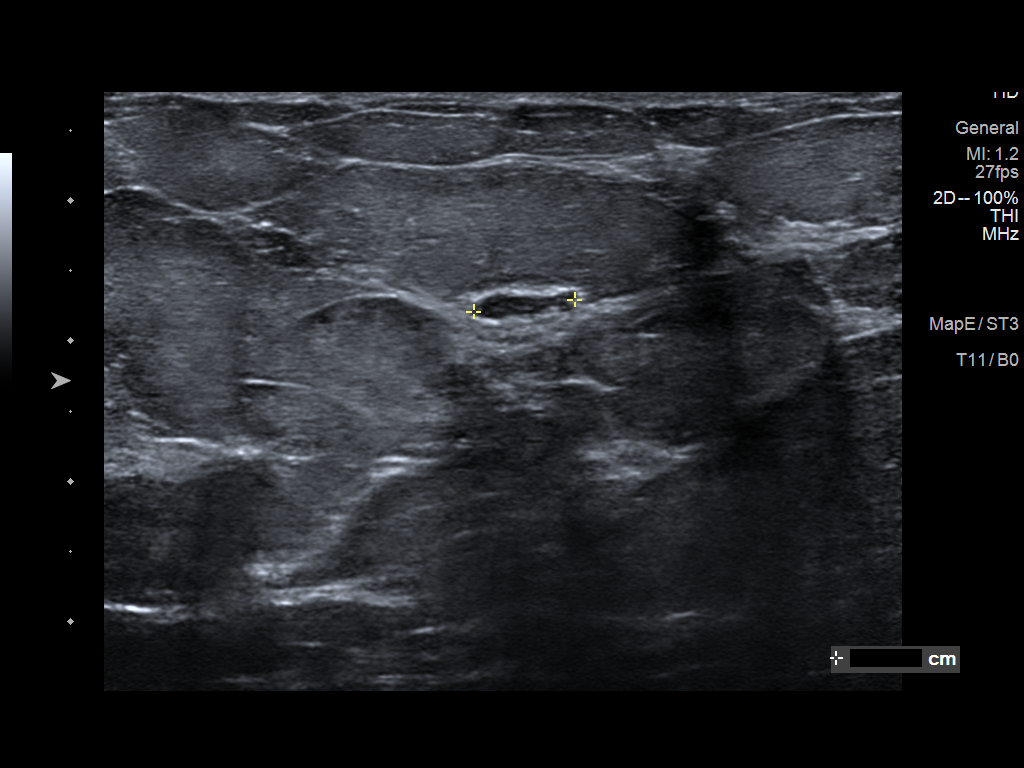
[im 5/5]
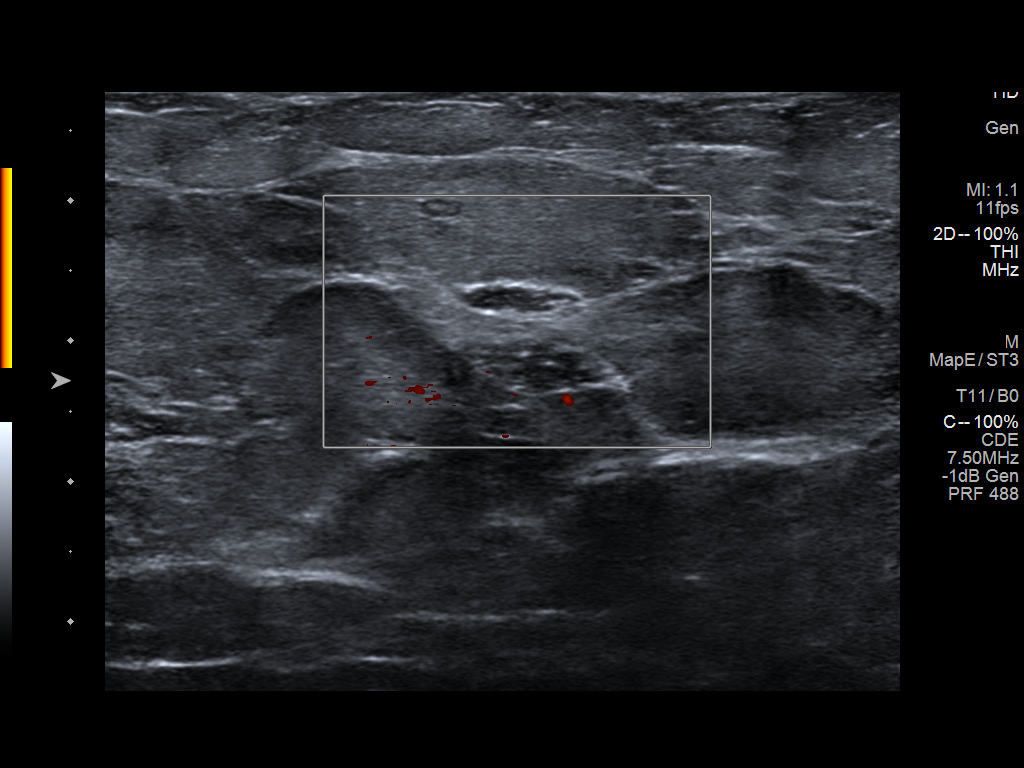

[5 of 5 positions shown; findings below may reference images not displayed]

ACR Breast Density Category b: There are scattered areas of
fibroglandular density.
FINDINGS: The previously demonstrated small, oval, circumscribed mass in the
anterior aspect of the lower inner quadrant of the left breast
appears slightly smaller and less dense today. No interval findings
suspicious for malignancy in the left breast.

Mammographic images were processed with CAD.

Targeted ultrasound is performed, showing a 9 x 7 x 2 mm oval,
horizontally oriented circumscribed, hypoechoic mass in the 8
o'clock position of the left breast, 2 cm from the nipple. This
measured 8 x 7 x 2 mm on 03/08/2018.
IMPRESSION: Stable small probably benign mass in the 8 o'clock position of the
left breast, most likely representing a mildly complicated cyst.

RECOMMENDATION:
Bilateral diagnostic mammogram and left breast ultrasound in 3
months. That will be 1 year since mammographic evaluation of the
right breast.

I have discussed the findings and recommendations with the patient.
Results were also provided in writing at the conclusion of the
visit. If applicable, a reminder letter will be sent to the patient
regarding the next appointment.

BI-RADS CATEGORY  3: Probably benign.

## 2020-03-07 ENCOUNTER — Other Ambulatory Visit: Payer: Self-pay | Admitting: Family Medicine

## 2020-03-07 DIAGNOSIS — Z0000 Encounter for general adult medical examination without abnormal findings: Secondary | ICD-10-CM | POA: Diagnosis not present

## 2020-03-07 DIAGNOSIS — M19049 Primary osteoarthritis, unspecified hand: Secondary | ICD-10-CM

## 2020-03-07 DIAGNOSIS — Z1159 Encounter for screening for other viral diseases: Secondary | ICD-10-CM | POA: Diagnosis not present

## 2020-03-07 DIAGNOSIS — E1169 Type 2 diabetes mellitus with other specified complication: Secondary | ICD-10-CM | POA: Diagnosis not present

## 2020-03-07 DIAGNOSIS — E7800 Pure hypercholesterolemia, unspecified: Secondary | ICD-10-CM | POA: Diagnosis not present

## 2020-03-07 DIAGNOSIS — Z1231 Encounter for screening mammogram for malignant neoplasm of breast: Secondary | ICD-10-CM

## 2020-03-07 DIAGNOSIS — I10 Essential (primary) hypertension: Secondary | ICD-10-CM | POA: Diagnosis not present

## 2020-03-08 ENCOUNTER — Other Ambulatory Visit: Payer: Self-pay | Admitting: Family Medicine

## 2020-03-08 DIAGNOSIS — N6320 Unspecified lump in the left breast, unspecified quadrant: Secondary | ICD-10-CM

## 2020-03-25 ENCOUNTER — Ambulatory Visit: Payer: Medicare Other | Admitting: Family Medicine

## 2020-03-25 ENCOUNTER — Ambulatory Visit: Payer: Self-pay

## 2020-03-25 ENCOUNTER — Encounter: Payer: Self-pay | Admitting: Family Medicine

## 2020-03-25 ENCOUNTER — Other Ambulatory Visit: Payer: Self-pay

## 2020-03-25 VITALS — BP 122/88 | HR 64 | Ht 62.0 in | Wt 208.0 lb

## 2020-03-25 DIAGNOSIS — M19049 Primary osteoarthritis, unspecified hand: Secondary | ICD-10-CM

## 2020-03-25 DIAGNOSIS — G8929 Other chronic pain: Secondary | ICD-10-CM

## 2020-03-25 DIAGNOSIS — M546 Pain in thoracic spine: Secondary | ICD-10-CM | POA: Diagnosis not present

## 2020-03-25 DIAGNOSIS — M25531 Pain in right wrist: Secondary | ICD-10-CM

## 2020-03-25 DIAGNOSIS — M999 Biomechanical lesion, unspecified: Secondary | ICD-10-CM | POA: Diagnosis not present

## 2020-03-25 DIAGNOSIS — M25532 Pain in left wrist: Secondary | ICD-10-CM

## 2020-03-25 DIAGNOSIS — M170 Bilateral primary osteoarthritis of knee: Secondary | ICD-10-CM | POA: Diagnosis not present

## 2020-03-25 NOTE — Patient Instructions (Addendum)
Injected wrists and right knee CoQ10 200mg  with statin Look into glutathione Will get approval for gel See me again in 4-5 weeks

## 2020-03-25 NOTE — Assessment & Plan Note (Signed)
Bilateral injections given today, tolerated the procedure well, discussed icing regimen and home exercise, which activities to do which wants to avoid.  Increase activity slowly.  Chronic problem with exacerbation.  Patient has moderate to severe arthritic changes and it may take PRP to help.  May need surgical intervention in the long run.  Very difficult for patient to do her job secondary to the amount of arthritis and pain.
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
## 2020-03-25 NOTE — Assessment & Plan Note (Signed)
   Decision today to treat with OMT was based on Physical Exam  After verbal consent patient was treated with HVLA, ME, FPR techniques in cervical, thoracic, rib, lareas, all areas are chronic   Patient tolerated the procedure well with improvement in symptoms  Patient given exercises, stretches and lifestyle modifications  See medications in patient instructions if given  Patient will follow up in 4-8 weeks

## 2020-03-29 ENCOUNTER — Other Ambulatory Visit: Payer: PRIVATE HEALTH INSURANCE

## 2020-04-09 ENCOUNTER — Ambulatory Visit: Payer: PRIVATE HEALTH INSURANCE | Admitting: Family Medicine

## 2020-05-07 ENCOUNTER — Other Ambulatory Visit: Payer: Self-pay

## 2020-05-07 ENCOUNTER — Ambulatory Visit (INDEPENDENT_AMBULATORY_CARE_PROVIDER_SITE_OTHER): Payer: Medicare Other | Admitting: Family Medicine

## 2020-05-07 ENCOUNTER — Encounter: Payer: Self-pay | Admitting: Family Medicine

## 2020-05-07 DIAGNOSIS — M170 Bilateral primary osteoarthritis of knee: Secondary | ICD-10-CM | POA: Diagnosis not present

## 2020-05-07 NOTE — Progress Notes (Signed)
[place] Phone: [phone] Subjective:   Rhonda [name], am serving as a scribe for Dr. Hulan Saas. This visit occurred during the SARS-CoV-2 public health emergency.  Safety protocols were in place, including screening questions prior to the visit, additional usage of staff PPE, and extensive cleaning of exam room while observing appropriate contact time as indicated for disinfecting solutions.   I'm seeing this patient by the request  of:  Kelton Pillar, MD  CC: Bilateral knee pain  [id number]   03/25/2020 Bilateral injections given today, tolerated the procedure well, discussed icing regimen and home exercise, which activities to do which wants to avoid.  Increase activity slowly.  Chronic problem with exacerbation.  Patient has moderate to severe arthritic changes and it may take PRP to help.  May need surgical intervention in the long run.  Very difficult for patient to do her job secondary to the amount of arthritis and pain. Update 05/07/2020 Rhonda [name] is a 64 y.o. female coming in with complaint of bilateral [facility] joint pain and thoracic spine pain and bilateral knee pain. Patient states that she is her for gel injections. Has been using kinesio tape. Thumb pain is intermittent but manageable.  Instability.  Affecting daily activities.      Past Medical History:  Diagnosis Date  . Anxiety   . Depression   . Encephalitis   . Hepatitis    ? B when age 68  . HTN (hypertension) 09/28/2013  . Hypertension   . Migraines   . Trigeminal neuralgia    Past Surgical History:  Procedure Laterality Date  . BRAIN SURGERY  2013   trigeminal neuralgia  . BREAST SURGERY     age 59-benign,  . COLONOSCOPY WITH PROPOFOL N/A 08/01/2013   Procedure: COLONOSCOPY WITH PROPOFOL;  Surgeon: Garlan Fair, MD;  Location: [facility];  Service: Endoscopy;  Laterality: N/A;  . CRANIOTOMY    . gamma knife      x1   Social History   Socioeconomic History  . Marital status: Married    Spouse name: Not on file  . Number of children: Not on file  . Years of education: Not on file  . Highest education level: Not on file  Occupational History  . Occupation: Nurse  Tobacco Use  . Smoking status: Never Smoker  . Smokeless tobacco: Never Used  Vaping Use  . Vaping Use: Never used  Substance and Sexual Activity  . Alcohol use: Yes    Comment: occassionally wine  . Drug use: [name]  . Sexual activity: Not on file  Other Topics Concern  . Not on file  Social History Narrative   Lives with husband, Richardson Landry   Caffeine use: 2 cups coffee per day   Right handed    Social Determinants of Health   Financial Resource Strain:   . Difficulty of Paying Living Expenses:   Food Insecurity:   . Worried About Charity fundraiser in the Last Year:   . Arboriculturist in the Last Year:   Transportation Needs:   . Film/video editor (Medical):   Marland Kitchen Lack of Transportation (Non-Medical):   Physical Activity:   . Days of Exercise per Week:   . Minutes of Exercise per Session:   Stress:   . Feeling of Stress :   Social Connections:   . Frequency of Communication with Friends and Family:   . Frequency of Social  Gatherings with Friends and Family:   . Attends Religious Services:   . Active Member of Clubs or Organizations:   . Attends Archivist Meetings:   Marland Kitchen Marital Status:    Allergies  Allergen Reactions  . Amlodipine Swelling  . Lisinopril Cough   Family History  Problem Relation Age of Onset  . Arthritis Mother   . Hyperlipidemia Mother   . Heart disease Mother   . Stroke Mother   . Hypertension Mother   . Diabetes Mother   . Arthritis Father   . Hyperlipidemia Father   . Heart disease Father   . Stroke Father   . Hypertension Father   . Diabetes Father   . Cancer Maternal Grandmother   . Kidney disease Maternal Grandmother   . Mental illness Maternal Grandmother   .  Hypertension Maternal Grandmother   . Heart disease Maternal Grandmother   . Cancer Maternal Grandfather   . Kidney disease Maternal Grandfather   . Mental illness Maternal Grandfather   . Hypertension Maternal Grandfather   . Heart disease Maternal Grandfather   . Cancer Paternal Grandmother   . Kidney disease Paternal Grandmother   . Mental illness Paternal Grandmother   . Hypertension Paternal Grandmother   . Heart disease Paternal Grandmother   . Cancer Paternal Grandfather   . Kidney disease Paternal Grandfather   . Mental illness Paternal Grandfather   . Hypertension Paternal Grandfather   . Heart disease Paternal Grandfather     Current Outpatient Medications (Endocrine & Metabolic):  .  metFORMIN (GLUCOPHAGE) 500 MG tablet, Take 500 mg by mouth daily with breakfast. .  predniSONE (DELTASONE) 50 MG tablet, 1 tablet by mouth daily .  predniSONE (DELTASONE) 50 MG tablet, Take one tablet daily for the next 5 days. .  predniSONE (DELTASONE) 50 MG tablet, Take one tablet daily for the next 5 days.  Current Outpatient Medications (Cardiovascular):  .  amLODipine (NORVASC) 5 MG tablet, Take 5 mg by mouth daily. .  hydrochlorothiazide (HYDRODIURIL) 25 MG tablet, Take 1 tablet (25 mg total) by mouth daily. Marland Kitchen  losartan (COZAAR) 100 MG tablet, Take 100 mg by mouth daily. .  metoprolol succinate (TOPROL-XL) 100 MG 24 hr tablet, Take 100 mg by mouth every evening. Take with or immediately following a meal.  Current Outpatient Medications (Respiratory):  .  montelukast (SINGULAIR) 10 MG tablet, TAKE 1 TABLET BY MOUTH ONCE DAILY EVERY EVENING .  PROAIR HFA 108 (90 Base) MCG/ACT inhaler, Inhale 2 puffs into the lungs every 6 (six) hours as needed.  Current Outpatient Medications (Analgesics):  .  aspirin EC 81 MG tablet, Take 81 mg by mouth daily. Marland Kitchen  ibuprofen (ADVIL) 800 MG tablet, Take 1 tablet (800 mg total) by mouth every 8 (eight) hours as needed. .  Ibuprofen-Famotidine 800-26.6 MG  TABS, Take 1 tablet 3 times daily. Marland Kitchen  oxyCODONE-acetaminophen (PERCOCET/ROXICET) 5-325 MG tablet, Take 1 tablet by mouth every 6 (six) hours as needed. for pain   Current Outpatient Medications (Other):  Marland Kitchen  ALPRAZolam (XANAX) 0.5 MG tablet, Take 0.5 mg by mouth every 6 (six) hours as needed. .  citalopram (CELEXA) 40 MG tablet, Take 40 mg by mouth daily. .  fish oil-omega-3 fatty acids 1000 MG capsule, Take 2 g by mouth daily. Marland Kitchen  gabapentin (NEURONTIN) 600 MG tablet, Take 300 mg by mouth 3 (three) times daily.  .  Glucosamine 500 MG CAPS, Take 1 capsule by mouth daily. Marland Kitchen  levETIRAcetam (KEPPRA) 500 MG tablet, Take  1 tablet (500 mg total) by mouth 2 (two) times daily. .  Multiple Vitamin (MULTIVITAMIN WITH MINERALS) TABS tablet, Take 1 tablet by mouth daily. .  ondansetron (ZOFRAN ODT) 4 MG disintegrating tablet, Take 1 tablet (4 mg total) by mouth every 8 (eight) hours as needed for nausea or vomiting. Marland Kitchen  tiZANidine (ZANAFLEX) 4 MG tablet, Take 1 tablet (4 mg total) by mouth at bedtime. Marland Kitchen  tiZANidine (ZANAFLEX) 4 MG tablet, Take 1 tablet (4 mg total) by mouth at bedtime. .  Vitamin D, Ergocalciferol, (DRISDOL) 1.25 MG (50000 UT) CAPS capsule, Take 1 capsule (50,000 Units total) by mouth every 7 (seven) days. .  vitamin E 400 UNIT capsule, Take 400 Units by mouth daily.   Reviewed prior external information including notes and imaging from  primary care provider As well as notes that were available from care everywhere and other healthcare systems.  Past medical history, social, surgical and family history all reviewed in electronic medical record.  [name] pertanent information unless stated regarding to the chief complaint.   Review of Systems:  [name] headache, visual changes, nausea, vomiting, diarrhea, constipation, dizziness, abdominal pain, skin rash, fevers, chills, night sweats, weight loss, swollen lymph nodes, body aches, joint swelling, chest pain, shortness of breath, mood changes.  POSITIVE muscle aches  Objective  Blood pressure 124/82, pulse 66, height 5\' 2"  (1.575 m), weight 206 lb (93.4 kg), SpO2 98 %.   General: [name] apparent distress alert and oriented x3 mood and affect normal, dressed appropriately.  HEENT: Pupils equal, extraocular movements intact  Respiratory: Patient's speak in full sentences and does not appear short of breath  Cardiovascular: [name] lower extremity edema, non tender, [name] erythema  Neuro: Cranial nerves II through XII are intact, neurovascularly intact in all extremities with 2+ DTRs and 2+ pulses.  Antalgic Knee: Bilateral valgus deformity noted.  Abnormal thigh to calf ratio.  Tender to palpation over medial and PF joint line.  ROM full in flexion and extension and lower leg rotation. instability with valgus force.  painful patellar compression. Patellar glide with moderate crepitus. Patellar and quadriceps tendons unremarkable. Hamstring and quadriceps strength is normal.  After informed written and verbal consent, patient was seated on exam table. Right knee was prepped with alcohol swab and utilizing anterolateral approach, patient's right knee space was injected with 60 mg per 3 mL of Durolane (sodium hyaluronate) in a prefilled syringe was injected easily into the knee through a 22-gauge needle..Patient tolerated the procedure well without immediate complications.  After informed written and verbal consent, patient was seated on exam table. Left knee was prepped with alcohol swab and utilizing anterolateral approach, patient's left knee space was injected with 60 mg per 3 mL Durolane (sodium hyaluronate) in a prefilled syringe was injected easily into the knee through a 22-gauge needle..Patient tolerated the procedure well without immediate complications.    Impression and Recommendations:     The above documentation has been reviewed and is accurate and complete Lyndal Pulley, DO       Note: This dictation was prepared with  Dragon dictation along with smaller phrase technology. Any transcriptional errors that result from this process are unintentional.

## 2020-05-07 NOTE — Patient Instructions (Signed)
Injected knees with gel today

## 2020-05-07 NOTE — Assessment & Plan Note (Addendum)
Chronic problem with exacerbation.  Patient has known arthritic changes.  Has responded relatively well to viscosupplementation previously.  Hoping that we will see the same thing again.  Discussed icing regimen and home exercise, which activities to potentially avoid.  Increase activity slowly.  Follow-up again in 4 to 8 weeks.

## 2020-05-24 ENCOUNTER — Other Ambulatory Visit: Payer: PRIVATE HEALTH INSURANCE

## 2020-06-05 ENCOUNTER — Other Ambulatory Visit: Payer: Self-pay | Admitting: Family Medicine

## 2020-06-05 DIAGNOSIS — M1990 Unspecified osteoarthritis, unspecified site: Secondary | ICD-10-CM

## 2020-06-05 DIAGNOSIS — N951 Menopausal and female climacteric states: Secondary | ICD-10-CM

## 2020-06-07 ENCOUNTER — Ambulatory Visit
Admission: RE | Admit: 2020-06-07 | Discharge: 2020-06-07 | Disposition: A | Discharge status: Home / Self Care | Payer: Medicare Other | Source: Ambulatory Visit | Attending: Family Medicine | Admitting: Family Medicine

## 2020-06-07 ENCOUNTER — Other Ambulatory Visit: Payer: Self-pay

## 2020-06-07 DIAGNOSIS — N6320 Unspecified lump in the left breast, unspecified quadrant: Secondary | ICD-10-CM

## 2020-06-07 DIAGNOSIS — Z780 Asymptomatic menopausal state: Secondary | ICD-10-CM | POA: Diagnosis not present

## 2020-06-07 DIAGNOSIS — N6324 Unspecified lump in the left breast, lower inner quadrant: Secondary | ICD-10-CM | POA: Diagnosis not present

## 2020-06-07 DIAGNOSIS — M1990 Unspecified osteoarthritis, unspecified site: Secondary | ICD-10-CM

## 2020-06-07 DIAGNOSIS — R928 Other abnormal and inconclusive findings on diagnostic imaging of breast: Secondary | ICD-10-CM | POA: Diagnosis not present

## 2020-06-13 DIAGNOSIS — E7800 Pure hypercholesterolemia, unspecified: Secondary | ICD-10-CM | POA: Diagnosis not present

## 2020-06-18 DIAGNOSIS — G500 Trigeminal neuralgia: Secondary | ICD-10-CM | POA: Diagnosis not present

## 2020-08-08 NOTE — Progress Notes (Signed)
[place] Phone: [phone] Subjective:   Rhonda [name], am serving as a scribe for Dr. Hulan Saas. This visit occurred during the SARS-CoV-2 public health emergency.  Safety protocols were in place, including screening questions prior to the visit, additional usage of staff PPE, and extensive cleaning of exam room while observing appropriate contact time as indicated for disinfecting solutions.   I'm seeing this patient by the request  of:  Kelton Pillar, MD  CC:, Wrist pain, knee pain, and back pain   [id number]   05/07/2020 Chronic problem with exacerbation.  Patient has known arthritic changes.  Has responded relatively well to viscosupplementation previously.  Hoping that we will see the same thing again.  Discussed icing regimen and home exercise, which activities to potentially avoid.  Increase activity slowly.  Follow-up again in 4 to 8 weeks.   Update 08/08/2020 Rhonda [name] is a 64 y.o. female coming in with complaint of bilateral knee pain. Patient states that she has had an increase in [facility] joint pain. Would like injections as she is unable to tear open IV bags due to increase in pain.  Thoracic and lower back pain remain unchanged. Manages pain with inversion table.        Past Medical History:  Diagnosis Date  . Anxiety   . Depression   . Encephalitis   . Hepatitis    ? B when age 63  . HTN (hypertension) 09/28/2013  . Hypertension   . Migraines   . Trigeminal neuralgia    Past Surgical History:  Procedure Laterality Date  . BRAIN SURGERY  2013   trigeminal neuralgia  . BREAST SURGERY     age 36-benign,  . COLONOSCOPY WITH PROPOFOL N/A 08/01/2013   Procedure: COLONOSCOPY WITH PROPOFOL;  Surgeon: Garlan Fair, MD;  Location: [facility];  Service: Endoscopy;  Laterality: N/A;  . CRANIOTOMY    . gamma knife     x1   Social History   Socioeconomic History  . Marital  status: Married    Spouse name: Not on file  . Number of children: Not on file  . Years of education: Not on file  . Highest education level: Not on file  Occupational History  . Occupation: Nurse  Tobacco Use  . Smoking status: Never Smoker  . Smokeless tobacco: Never Used  Vaping Use  . Vaping Use: Never used  Substance and Sexual Activity  . Alcohol use: Yes    Comment: occassionally wine  . Drug use: [name]  . Sexual activity: Not on file  Other Topics Concern  . Not on file  Social History Narrative   Lives with husband, Richardson Landry   Caffeine use: 2 cups coffee per day   Right handed    Social Determinants of Health   Financial Resource Strain:   . Difficulty of Paying Living Expenses: Not on file  Food Insecurity:   . Worried About Charity fundraiser in the Last Year: Not on file  . Ran Out of Food in the Last Year: Not on file  Transportation Needs:   . Lack of Transportation (Medical): Not on file  . Lack of Transportation (Non-Medical): Not on file  Physical Activity:   . Days of Exercise per Week: Not on file  . Minutes of Exercise per Session: Not on file  Stress:   . Feeling of Stress : Not on file  Social Connections:   . Frequency  of Communication with Friends and Family: Not on file  . Frequency of Social Gatherings with Friends and Family: Not on file  . Attends Religious Services: Not on file  . Active Member of Clubs or Organizations: Not on file  . Attends Archivist Meetings: Not on file  . Marital Status: Not on file   Allergies  Allergen Reactions  . Amlodipine Swelling  . Lisinopril Cough   Family History  Problem Relation Age of Onset  . Arthritis Mother   . Hyperlipidemia Mother   . Heart disease Mother   . Stroke Mother   . Hypertension Mother   . Diabetes Mother   . Arthritis Father   . Hyperlipidemia Father   . Heart disease Father   . Stroke Father   . Hypertension Father   . Diabetes Father   . Cancer Maternal  Grandmother   . Kidney disease Maternal Grandmother   . Mental illness Maternal Grandmother   . Hypertension Maternal Grandmother   . Heart disease Maternal Grandmother   . Cancer Maternal Grandfather   . Kidney disease Maternal Grandfather   . Mental illness Maternal Grandfather   . Hypertension Maternal Grandfather   . Heart disease Maternal Grandfather   . Cancer Paternal Grandmother   . Kidney disease Paternal Grandmother   . Mental illness Paternal Grandmother   . Hypertension Paternal Grandmother   . Heart disease Paternal Grandmother   . Cancer Paternal Grandfather   . Kidney disease Paternal Grandfather   . Mental illness Paternal Grandfather   . Hypertension Paternal Grandfather   . Heart disease Paternal Grandfather     Current Outpatient Medications (Endocrine & Metabolic):  .  metFORMIN (GLUCOPHAGE) 500 MG tablet, Take 500 mg by mouth daily with breakfast. .  predniSONE (DELTASONE) 50 MG tablet, 1 tablet by mouth daily .  predniSONE (DELTASONE) 50 MG tablet, Take one tablet daily for the next 5 days. .  predniSONE (DELTASONE) 50 MG tablet, Take one tablet daily for the next 5 days.  Current Outpatient Medications (Cardiovascular):  .  amLODipine (NORVASC) 5 MG tablet, Take 5 mg by mouth daily. .  hydrochlorothiazide (HYDRODIURIL) 25 MG tablet, Take 1 tablet (25 mg total) by mouth daily. Marland Kitchen  losartan (COZAAR) 100 MG tablet, Take 100 mg by mouth daily. .  metoprolol succinate (TOPROL-XL) 100 MG 24 hr tablet, Take 100 mg by mouth every evening. Take with or immediately following a meal.  Current Outpatient Medications (Respiratory):  .  montelukast (SINGULAIR) 10 MG tablet, TAKE 1 TABLET BY MOUTH ONCE DAILY EVERY EVENING .  PROAIR HFA 108 (90 Base) MCG/ACT inhaler, Inhale 2 puffs into the lungs every 6 (six) hours as needed.  Current Outpatient Medications (Analgesics):  .  aspirin EC 81 MG tablet, Take 81 mg by mouth daily. Marland Kitchen  ibuprofen (ADVIL) 800 MG tablet, Take 1  tablet (800 mg total) by mouth every 8 (eight) hours as needed. .  Ibuprofen-Famotidine 800-26.6 MG TABS, Take 1 tablet 3 times daily. Marland Kitchen  oxyCODONE-acetaminophen (PERCOCET/ROXICET) 5-325 MG tablet, Take 1 tablet by mouth every 6 (six) hours as needed. for pain   Current Outpatient Medications (Other):  Marland Kitchen  ALPRAZolam (XANAX) 0.5 MG tablet, Take 0.5 mg by mouth every 6 (six) hours as needed. .  citalopram (CELEXA) 40 MG tablet, Take 40 mg by mouth daily. .  fish oil-omega-3 fatty acids 1000 MG capsule, Take 2 g by mouth daily. Marland Kitchen  gabapentin (NEURONTIN) 600 MG tablet, Take 300 mg by mouth 3 (  three) times daily.  .  Glucosamine 500 MG CAPS, Take 1 capsule by mouth daily. Marland Kitchen  levETIRAcetam (KEPPRA) 500 MG tablet, Take 1 tablet (500 mg total) by mouth 2 (two) times daily. .  Multiple Vitamin (MULTIVITAMIN WITH MINERALS) TABS tablet, Take 1 tablet by mouth daily. .  ondansetron (ZOFRAN ODT) 4 MG disintegrating tablet, Take 1 tablet (4 mg total) by mouth every 8 (eight) hours as needed for nausea or vomiting. Marland Kitchen  tiZANidine (ZANAFLEX) 4 MG tablet, Take 1 tablet (4 mg total) by mouth at bedtime. Marland Kitchen  tiZANidine (ZANAFLEX) 4 MG tablet, Take 1 tablet (4 mg total) by mouth at bedtime. .  Vitamin D, Ergocalciferol, (DRISDOL) 1.25 MG (50000 UT) CAPS capsule, Take 1 capsule (50,000 Units total) by mouth every 7 (seven) days. .  vitamin E 400 UNIT capsule, Take 400 Units by mouth daily.   Reviewed prior external information including notes and imaging from  primary care provider As well as notes that were available from care everywhere and other healthcare systems.  Past medical history, social, surgical and family history all reviewed in electronic medical record.  [name] pertanent information unless stated regarding to the chief complaint.   Review of Systems:  [name] headache, visual changes, nausea, vomiting, diarrhea, constipation, dizziness, abdominal pain, skin rash, fevers, chills, night sweats, weight loss,  swollen lymph nodes, body aches, joint swelling, chest pain, shortness of breath, mood changes. POSITIVE muscle aches  Objective  Blood pressure 122/84, pulse 67, height 5\' 2"  (1.575 m), weight 207 lb (93.9 kg), SpO2 97 %.   General: [name] apparent distress alert and oriented x3 mood and affect normal, dressed appropriately.  HEENT: Pupils equal, extraocular movements intact  Respiratory: Patient's speak in full sentences and does not appear short of breath  Cardiovascular: [name] lower extremity edema, non tender, [name] erythema  Neuro: Cranial nerves II through XII are intact, neurovascularly intact in all extremities with 2+ DTRs and 2+ pulses.  Gait normal with good balance and coordination.  MSK: Right wrist has a positive Finkelstein's test.  Patient does have positive grind test but worse to the [facility] on the left side.  Patient still has good strength in the thenar eminence bilaterally.  Back exam does have some loss of lordosis, tightness noted in the thoracolumbar juncture.  Patient does have some mild pain over the right sacroiliac joint.  Tightness in the neck but negative Spurling's.  Mild decrease in sidebending bilaterally right greater than left  Osteopathic findings C2 flexed rotated and side bent right C7 flexed rotated and side bent left T5 extended rotated and side bent right inhaled rib L1 flexed rotated and side bent right Sacrum right on right   Procedure: Real-time Ultrasound Guided Injection of right Abductor pollicis longs tendon sheath Device: GE Logiq E  Ultrasound guided injection is preferred based studies that show increased duration, increased effect, greater accuracy, decreased procedural pain, increased response rate with ultrasound guided versus blind injection.  Verbal informed consent obtained.  Time-out conducted.  Noted [name] overlying erythema, induration, or other signs of local infection.  Skin prepped in a sterile fashion.  Local anesthesia: Topical Ethyl  chloride.  With sterile technique and under real time ultrasound guidance:  tendon visualized.  23g 5/8 inch needle inserted distal to proximal approach into tendon sheath. Pictures taken  for needle placement. Patient did have injection of 0.5 cc of 0.5% Marcaine, and 0.5 cc of Kenalog 40 mg/dL. Completed without difficulty  Pain immediately resolved suggesting accurate placement of  the medication.  Advised to call if fevers/chills, erythema, induration, drainage, or persistent bleeding.  Impression: Technically successful ultrasound guided injection.  Procedure: Real-time Ultrasound Guided Injection of left CMC joint Device: GE Logiq Q7 Ultrasound guided injection is preferred based studies that show increased duration, increased effect, greater accuracy, decreased procedural pain, increased response rate, and decreased cost with ultrasound guided versus blind injection.  Verbal informed consent obtained.  Time-out conducted.  Noted [name] overlying erythema, induration, or other signs of local infection.  Skin prepped in a sterile fashion.  Local anesthesia: Topical Ethyl chloride.  With sterile technique and under real time ultrasound guidance: With a 25-gauge half inch needle injected with 0.5 cc of 0.5% Marcaine and 0.5 cc of Kenalog 40 mg/mL into the left CMC joint Completed without difficulty  Pain immediately resolved suggesting accurate placement of the medication.  Advised to call if fevers/chills, erythema, induration, drainage, or persistent bleeding.  Impression: Technically successful ultrasound guided injection.    Impression and Recommendations:     The above documentation has been reviewed and is accurate and complete Lyndal Pulley, DO

## 2020-08-09 ENCOUNTER — Ambulatory Visit: Payer: Self-pay

## 2020-08-09 ENCOUNTER — Other Ambulatory Visit: Payer: Self-pay

## 2020-08-09 ENCOUNTER — Encounter: Payer: Self-pay | Admitting: Family Medicine

## 2020-08-09 ENCOUNTER — Ambulatory Visit (INDEPENDENT_AMBULATORY_CARE_PROVIDER_SITE_OTHER): Payer: Medicare Other | Admitting: Family Medicine

## 2020-08-09 VITALS — BP 122/84 | HR 67 | Ht 62.0 in | Wt 207.0 lb

## 2020-08-09 DIAGNOSIS — M25531 Pain in right wrist: Secondary | ICD-10-CM | POA: Diagnosis not present

## 2020-08-09 DIAGNOSIS — M19042 Primary osteoarthritis, left hand: Secondary | ICD-10-CM

## 2020-08-09 DIAGNOSIS — M999 Biomechanical lesion, unspecified: Secondary | ICD-10-CM | POA: Diagnosis not present

## 2020-08-09 DIAGNOSIS — M170 Bilateral primary osteoarthritis of knee: Secondary | ICD-10-CM

## 2020-08-09 DIAGNOSIS — M19049 Primary osteoarthritis, unspecified hand: Secondary | ICD-10-CM

## 2020-08-09 DIAGNOSIS — M654 Radial styloid tenosynovitis [de Quervain]: Secondary | ICD-10-CM

## 2020-08-09 NOTE — Assessment & Plan Note (Signed)
Right-sided injected today August 09, 2020 pain seems to be more likely given with no difficulty today.  Will be beneficial.  Discussed bracing at night and avoid picking up repetitive things.  Follow-up again in 3 months

## 2020-08-09 NOTE — Assessment & Plan Note (Signed)
Patient will be following up again in the near future to discuss her knees.

## 2020-08-09 NOTE — Patient Instructions (Addendum)
See me in 4-6 weeks for your knee Injected both wrists today

## 2020-08-09 NOTE — Assessment & Plan Note (Signed)
CMC injection left-sided given today.  Tolerated the procedure well.  Wants to avoid any surgical intervention still.  Hoping that this will make some benefit.  Follow-up with me again in 3 months

## 2020-08-20 ENCOUNTER — Ambulatory Visit: Payer: Medicare Other | Admitting: Family Medicine

## 2020-08-27 DIAGNOSIS — M5136 Other intervertebral disc degeneration, lumbar region: Secondary | ICD-10-CM | POA: Diagnosis not present

## 2020-08-27 DIAGNOSIS — M9902 Segmental and somatic dysfunction of thoracic region: Secondary | ICD-10-CM | POA: Diagnosis not present

## 2020-08-27 DIAGNOSIS — M9904 Segmental and somatic dysfunction of sacral region: Secondary | ICD-10-CM | POA: Diagnosis not present

## 2020-08-27 DIAGNOSIS — M9905 Segmental and somatic dysfunction of pelvic region: Secondary | ICD-10-CM | POA: Diagnosis not present

## 2020-09-06 ENCOUNTER — Ambulatory Visit: Payer: Self-pay

## 2020-09-06 ENCOUNTER — Encounter: Payer: Self-pay | Admitting: Family Medicine

## 2020-09-06 ENCOUNTER — Ambulatory Visit (INDEPENDENT_AMBULATORY_CARE_PROVIDER_SITE_OTHER): Payer: Medicare Other

## 2020-09-06 ENCOUNTER — Ambulatory Visit: Payer: Medicare Other | Admitting: Family Medicine

## 2020-09-06 ENCOUNTER — Other Ambulatory Visit: Payer: Self-pay

## 2020-09-06 VITALS — BP 124/96 | HR 63 | Ht 62.0 in | Wt 205.0 lb

## 2020-09-06 DIAGNOSIS — M79641 Pain in right hand: Secondary | ICD-10-CM

## 2020-09-06 DIAGNOSIS — M25531 Pain in right wrist: Secondary | ICD-10-CM | POA: Diagnosis not present

## 2020-09-06 DIAGNOSIS — M19049 Primary osteoarthritis, unspecified hand: Secondary | ICD-10-CM

## 2020-09-06 NOTE — Assessment & Plan Note (Addendum)
Patient given another injection on the right side.  Last exam we tried a de Quervain's injection that did not seem to help.  He continues to have significant amount of discomfort and pain.  Patient will get repeat x-rays to see the progression with an expected moderate to severe arthritic changes.  Will refer to orthopedic surgery to discuss the potential for replacement.  Patient does want to wait until after the first of the year.  Continue bracing icing and topical anti-inflammatories patient is continuing to have this discomfort and wants to avoid having any type of surgical intervention first of the year so we did discuss 20 mg of prednisone daily for 7 days if necessary the patient will have to monitor the blood sugars.  Patient understands that and will call if necessary.

## 2020-09-06 NOTE — Patient Instructions (Addendum)
Dr. Amedeo Plenty at [facility] today Alt heat and ice Brace as much as possible  See me when you need me

## 2020-09-06 NOTE — Progress Notes (Signed)
[place] Phone: [phone] Subjective:   [name], am serving as a scribe for Dr. Hulan Saas. This visit occurred during the SARS-CoV-2 public health emergency.  Safety protocols were in place, including screening questions prior to the visit, additional usage of staff PPE, and extensive cleaning of exam room while observing appropriate contact time as indicated for disinfecting solutions.   I'm seeing this patient by the request  of:  Kelton Pillar, MD  CC: Bilateral knee, right, follow-up  [id number]  Rhonda [name] is a 64 y.o. female coming in with complaint of back and knee pain. Visco given 05/07/2020. Patient states that her knee pain is not has bad as her right thumb. Injection last visit did not help her right CMC joint. Pain is constant.  Patient states that this is no improvement but no worsening from patient's previous injection.  Right thumb we did attempt a de Quervain's at last exam.       Past Medical History:  Diagnosis Date  . Anxiety   . Depression   . Encephalitis   . Hepatitis    ? B when age 35  . HTN (hypertension) 09/28/2013  . Hypertension   . Migraines   . Trigeminal neuralgia    Past Surgical History:  Procedure Laterality Date  . BRAIN SURGERY  2013   trigeminal neuralgia  . BREAST SURGERY     age 66-benign,  . COLONOSCOPY WITH PROPOFOL N/A 08/01/2013   Procedure: COLONOSCOPY WITH PROPOFOL;  Surgeon: Garlan Fair, MD;  Location: [facility];  Service: Endoscopy;  Laterality: N/A;  . CRANIOTOMY    . gamma knife     x1   Social History   Socioeconomic History  . Marital status: Married    Spouse name: Not on file  . Number of children: Not on file  . Years of education: Not on file  . Highest education level: Not on file  Occupational History  . Occupation: Nurse  Tobacco Use  . Smoking status: Never Smoker  . Smokeless tobacco: Never Used    Vaping Use  . Vaping Use: Never used  Substance and Sexual Activity  . Alcohol use: Yes    Comment: occassionally wine  . Drug use: No  . Sexual activity: Not on file  Other Topics Concern  . Not on file  Social History Narrative   Lives with husband, Richardson Landry   Caffeine use: 2 cups coffee per day   Right handed    Social Determinants of Health   Financial Resource Strain:   . Difficulty of Paying Living Expenses: Not on file  Food Insecurity:   . Worried About Charity fundraiser in the Last Year: Not on file  . Ran Out of Food in the Last Year: Not on file  Transportation Needs:   . Lack of Transportation (Medical): Not on file  . Lack of Transportation (Non-Medical): Not on file  Physical Activity:   . Days of Exercise per Week: Not on file  . Minutes of Exercise per Session: Not on file  Stress:   . Feeling of Stress : Not on file  Social Connections:   . Frequency of Communication with Friends and Family: Not on file  . Frequency of Social Gatherings with Friends and Family: Not on file  . Attends Religious Services: Not on file  . Active Member of Clubs or Organizations: Not on file  . Attends Club or  Organization Meetings: Not on file  . Marital Status: Not on file   Allergies  Allergen Reactions  . Amlodipine Swelling  . Lisinopril Cough   Family History  Problem Relation Age of Onset  . Arthritis Mother   . Hyperlipidemia Mother   . Heart disease Mother   . Stroke Mother   . Hypertension Mother   . Diabetes Mother   . Arthritis Father   . Hyperlipidemia Father   . Heart disease Father   . Stroke Father   . Hypertension Father   . Diabetes Father   . Cancer Maternal Grandmother   . Kidney disease Maternal Grandmother   . Mental illness Maternal Grandmother   . Hypertension Maternal Grandmother   . Heart disease Maternal Grandmother   . Cancer Maternal Grandfather   . Kidney disease Maternal Grandfather   . Mental illness Maternal Grandfather   .  Hypertension Maternal Grandfather   . Heart disease Maternal Grandfather   . Cancer Paternal Grandmother   . Kidney disease Paternal Grandmother   . Mental illness Paternal Grandmother   . Hypertension Paternal Grandmother   . Heart disease Paternal Grandmother   . Cancer Paternal Grandfather   . Kidney disease Paternal Grandfather   . Mental illness Paternal Grandfather   . Hypertension Paternal Grandfather   . Heart disease Paternal Grandfather     Current Outpatient Medications (Endocrine & Metabolic):  .  metFORMIN (GLUCOPHAGE) 500 MG tablet, Take 500 mg by mouth daily with breakfast. .  predniSONE (DELTASONE) 50 MG tablet, 1 tablet by mouth daily .  predniSONE (DELTASONE) 50 MG tablet, Take one tablet daily for the next 5 days. .  predniSONE (DELTASONE) 50 MG tablet, Take one tablet daily for the next 5 days.  Current Outpatient Medications (Cardiovascular):  .  amLODipine (NORVASC) 5 MG tablet, Take 5 mg by mouth daily. .  hydrochlorothiazide (HYDRODIURIL) 25 MG tablet, Take 1 tablet (25 mg total) by mouth daily. Marland Kitchen  losartan (COZAAR) 100 MG tablet, Take 100 mg by mouth daily. .  metoprolol succinate (TOPROL-XL) 100 MG 24 hr tablet, Take 100 mg by mouth every evening. Take with or immediately following a meal.  Current Outpatient Medications (Respiratory):  .  montelukast (SINGULAIR) 10 MG tablet, TAKE 1 TABLET BY MOUTH ONCE DAILY EVERY EVENING .  PROAIR HFA 108 (90 Base) MCG/ACT inhaler, Inhale 2 puffs into the lungs every 6 (six) hours as needed.  Current Outpatient Medications (Analgesics):  .  aspirin EC 81 MG tablet, Take 81 mg by mouth daily. Marland Kitchen  ibuprofen (ADVIL) 800 MG tablet, Take 1 tablet (800 mg total) by mouth every 8 (eight) hours as needed. .  Ibuprofen-Famotidine 800-26.6 MG TABS, Take 1 tablet 3 times daily. Marland Kitchen  oxyCODONE-acetaminophen (PERCOCET/ROXICET) 5-325 MG tablet, Take 1 tablet by mouth every 6 (six) hours as needed. for pain   Current Outpatient  Medications (Other):  Marland Kitchen  ALPRAZolam (XANAX) 0.5 MG tablet, Take 0.5 mg by mouth every 6 (six) hours as needed. .  citalopram (CELEXA) 40 MG tablet, Take 40 mg by mouth daily. .  fish oil-omega-3 fatty acids 1000 MG capsule, Take 2 g by mouth daily. Marland Kitchen  gabapentin (NEURONTIN) 600 MG tablet, Take 300 mg by mouth 3 (three) times daily.  .  Glucosamine 500 MG CAPS, Take 1 capsule by mouth daily. Marland Kitchen  levETIRAcetam (KEPPRA) 500 MG tablet, Take 1 tablet (500 mg total) by mouth 2 (two) times daily. .  Multiple Vitamin (MULTIVITAMIN WITH MINERALS) TABS tablet, Take 1  tablet by mouth daily. .  ondansetron (ZOFRAN ODT) 4 MG disintegrating tablet, Take 1 tablet (4 mg total) by mouth every 8 (eight) hours as needed for nausea or vomiting. Marland Kitchen  tiZANidine (ZANAFLEX) 4 MG tablet, Take 1 tablet (4 mg total) by mouth at bedtime. Marland Kitchen  tiZANidine (ZANAFLEX) 4 MG tablet, Take 1 tablet (4 mg total) by mouth at bedtime. .  Vitamin D, Ergocalciferol, (DRISDOL) 1.25 MG (50000 UT) CAPS capsule, Take 1 capsule (50,000 Units total) by mouth every 7 (seven) days. .  vitamin E 400 UNIT capsule, Take 400 Units by mouth daily.   Reviewed prior external information including notes and imaging from  primary care provider As well as notes that were available from care everywhere and other healthcare systems.  Past medical history, social, surgical and family history all reviewed in electronic medical record.  No pertanent information unless stated regarding to the chief complaint.   Review of Systems:  No headache, visual changes, nausea, vomiting, diarrhea, constipation, dizziness, abdominal pain, skin rash, fevers, chills, night sweats, weight loss, swollen lymph nodes, body aches, joint swelling, chest pain, shortness of breath, mood changes. POSITIVE muscle aches  Objective  Blood pressure (!) 124/96, pulse 63, height 5\' 2"  (1.575 m), weight 205 lb (93 kg), SpO2 97 %.   General: No apparent distress alert and oriented x3  mood and affect normal, dressed appropriately.  HEENT: Pupils equal, extraocular movements intact  Respiratory: Patient's speak in full sentences and does not appear short of breath  Cardiovascular: No lower extremity edema, non tender, no erythema  Gait normal with good balance and coordination.  MSK: Right thumb exam shows the patient does have positive grind test.  Still some mild positive Finkelstein's but not as bad.  No significant swelling over the abductor pollicis longus tendon anymore.   Limited musculoskeletal ultrasound was performed and interpreted by Lyndal Pulley  Limited ultrasound shows the patient does have some effusion noted of the [facility] joint with moderate to severe arthritic changes.  Patient is also abductor pollicis longus s has increasing in neovascularization in the area that is consistent with a potential partial tears.  Less hypoechoic changes around the tendon sheath with previous exam no. Impression: CMC arthritis with questionable tearing of the abductor pollicis longus tendon  Procedure: Real-time Ultrasound Guided Injection of right CMC joint. Device: GE Logiq Q7 Ultrasound guided injection is preferred based studies that show increased duration, increased effect, greater accuracy, decreased procedural pain, increased response rate, and decreased cost with ultrasound guided versus blind injection.  Verbal informed consent obtained.  Time-out conducted.  Noted no overlying erythema, induration, or other signs of local infection.  Skin prepped in a sterile fashion.  Local anesthesia: Topical Ethyl chloride.  With sterile technique and under real time ultrasound guidance: With a 25-gauge half inch needle injected into the right [facility] joint with a total of 0.5 cc of 0.5% Marcaine and 0.25 cc of Kenalog 40 mg/mL. Completed without difficulty  Pain immediately improved but continued to have pain suggesting accurate placement of the medication.  Advised to call if  fevers/chills, erythema, induration, drainage, or persistent bleeding.  Impression: Technically successful ultrasound guided injection.   Impression and Recommendations:     The above documentation has been reviewed and is accurate and complete Lyndal Pulley, DO

## 2020-09-10 ENCOUNTER — Telehealth: Payer: Self-pay | Admitting: Family Medicine

## 2020-09-10 NOTE — Telephone Encounter (Signed)
Pt called, states at last visit Dr. Tamala Julian advised a referral to Dr. Amedeo Plenty was to be placed. She called their office and they have not received, I do not see in Epic.

## 2020-09-11 ENCOUNTER — Other Ambulatory Visit: Payer: Self-pay

## 2020-09-11 DIAGNOSIS — M19049 Primary osteoarthritis, unspecified hand: Secondary | ICD-10-CM

## 2020-09-12 ENCOUNTER — Other Ambulatory Visit: Payer: Self-pay

## 2020-09-12 NOTE — Telephone Encounter (Signed)
Called pt to inform referral to [facility] sent. She called them and is on a wait list for Dr. Amedeo Plenty until January.  She will wait,unless we have another recommendation that meets her expectations of Dr. Amedeo Plenty.

## 2020-09-12 NOTE — Telephone Encounter (Signed)
Sent patient MyChart message about Dr. Grandville Silos as another option.

## 2020-09-12 NOTE — Telephone Encounter (Signed)
We could do Dr. Grandville Silos if she would like, he would be good

## 2020-09-17 NOTE — Telephone Encounter (Signed)
Spoke with pt, she has decided to wait for Dr. Amedeo Plenty.

## 2020-09-18 DIAGNOSIS — E1169 Type 2 diabetes mellitus with other specified complication: Secondary | ICD-10-CM | POA: Diagnosis not present

## 2020-09-18 DIAGNOSIS — E7800 Pure hypercholesterolemia, unspecified: Secondary | ICD-10-CM | POA: Diagnosis not present

## 2020-09-18 DIAGNOSIS — I10 Essential (primary) hypertension: Secondary | ICD-10-CM | POA: Diagnosis not present

## 2020-10-23 DIAGNOSIS — M9904 Segmental and somatic dysfunction of sacral region: Secondary | ICD-10-CM | POA: Diagnosis not present

## 2020-10-23 DIAGNOSIS — M5136 Other intervertebral disc degeneration, lumbar region: Secondary | ICD-10-CM | POA: Diagnosis not present

## 2020-10-23 DIAGNOSIS — M9905 Segmental and somatic dysfunction of pelvic region: Secondary | ICD-10-CM | POA: Diagnosis not present

## 2020-10-23 DIAGNOSIS — M9902 Segmental and somatic dysfunction of thoracic region: Secondary | ICD-10-CM | POA: Diagnosis not present
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
## 2021-01-01 DIAGNOSIS — M9903 Segmental and somatic dysfunction of lumbar region: Secondary | ICD-10-CM | POA: Diagnosis not present

## 2021-01-01 DIAGNOSIS — M9902 Segmental and somatic dysfunction of thoracic region: Secondary | ICD-10-CM | POA: Diagnosis not present

## 2021-01-01 DIAGNOSIS — M5134 Other intervertebral disc degeneration, thoracic region: Secondary | ICD-10-CM | POA: Diagnosis not present

## 2021-01-01 DIAGNOSIS — M5136 Other intervertebral disc degeneration, lumbar region: Secondary | ICD-10-CM | POA: Diagnosis not present

## 2021-01-02 DIAGNOSIS — M180 Bilateral primary osteoarthritis of first carpometacarpal joints: Secondary | ICD-10-CM | POA: Diagnosis not present

## 2021-01-02 DIAGNOSIS — M1812 Unilateral primary osteoarthritis of first carpometacarpal joint, left hand: Secondary | ICD-10-CM | POA: Diagnosis not present

## 2021-01-02 DIAGNOSIS — M1811 Unilateral primary osteoarthritis of first carpometacarpal joint, right hand: Secondary | ICD-10-CM | POA: Diagnosis not present

## 2021-02-10 DIAGNOSIS — G500 Trigeminal neuralgia: Secondary | ICD-10-CM | POA: Diagnosis not present

## 2021-03-05 DIAGNOSIS — H35033 Hypertensive retinopathy, bilateral: Secondary | ICD-10-CM | POA: Diagnosis not present

## 2021-03-05 DIAGNOSIS — H40023 Open angle with borderline findings, high risk, bilateral: Secondary | ICD-10-CM | POA: Diagnosis not present

## 2021-03-05 DIAGNOSIS — H04123 Dry eye syndrome of bilateral lacrimal glands: Secondary | ICD-10-CM | POA: Diagnosis not present

## 2021-03-05 DIAGNOSIS — H2513 Age-related nuclear cataract, bilateral: Secondary | ICD-10-CM | POA: Diagnosis not present

## 2021-03-12 DIAGNOSIS — E1169 Type 2 diabetes mellitus with other specified complication: Secondary | ICD-10-CM | POA: Diagnosis not present

## 2021-03-12 DIAGNOSIS — Z0000 Encounter for general adult medical examination without abnormal findings: Secondary | ICD-10-CM | POA: Diagnosis not present

## 2021-03-12 DIAGNOSIS — E7800 Pure hypercholesterolemia, unspecified: Secondary | ICD-10-CM | POA: Diagnosis not present

## 2021-03-12 DIAGNOSIS — I10 Essential (primary) hypertension: Secondary | ICD-10-CM | POA: Diagnosis not present

## 2021-03-27 DIAGNOSIS — G500 Trigeminal neuralgia: Secondary | ICD-10-CM | POA: Diagnosis not present

## 2021-05-01 DIAGNOSIS — M5136 Other intervertebral disc degeneration, lumbar region: Secondary | ICD-10-CM | POA: Diagnosis not present

## 2021-05-01 DIAGNOSIS — M9905 Segmental and somatic dysfunction of pelvic region: Secondary | ICD-10-CM | POA: Diagnosis not present

## 2021-05-01 DIAGNOSIS — M9904 Segmental and somatic dysfunction of sacral region: Secondary | ICD-10-CM | POA: Diagnosis not present

## 2021-05-01 DIAGNOSIS — M9903 Segmental and somatic dysfunction of lumbar region: Secondary | ICD-10-CM | POA: Diagnosis not present

## 2021-09-17 ENCOUNTER — Other Ambulatory Visit: Payer: Self-pay | Admitting: Family Medicine

## 2021-09-17 DIAGNOSIS — R7989 Other specified abnormal findings of blood chemistry: Secondary | ICD-10-CM

## 2021-10-07 ENCOUNTER — Ambulatory Visit
Admission: RE | Admit: 2021-10-07 | Discharge: 2021-10-07 | Disposition: A | Discharge status: Home / Self Care | Payer: Medicare Other | Source: Ambulatory Visit | Attending: Family Medicine | Admitting: Family Medicine

## 2021-10-07 DIAGNOSIS — R7989 Other specified abnormal findings of blood chemistry: Secondary | ICD-10-CM

## 2021-12-10 DIAGNOSIS — M5134 Other intervertebral disc degeneration, thoracic region: Secondary | ICD-10-CM | POA: Diagnosis not present

## 2021-12-10 DIAGNOSIS — M5136 Other intervertebral disc degeneration, lumbar region: Secondary | ICD-10-CM | POA: Diagnosis not present

## 2021-12-10 DIAGNOSIS — M9902 Segmental and somatic dysfunction of thoracic region: Secondary | ICD-10-CM | POA: Diagnosis not present

## 2021-12-10 DIAGNOSIS — M9903 Segmental and somatic dysfunction of lumbar region: Secondary | ICD-10-CM | POA: Diagnosis not present

## 2021-12-11 DIAGNOSIS — R52 Pain, unspecified: Secondary | ICD-10-CM | POA: Diagnosis not present

## 2021-12-11 DIAGNOSIS — M180 Bilateral primary osteoarthritis of first carpometacarpal joints: Secondary | ICD-10-CM | POA: Diagnosis not present

## 2021-12-11 DIAGNOSIS — M13849 Other specified arthritis, unspecified hand: Secondary | ICD-10-CM | POA: Diagnosis not present

## 2021-12-22 DIAGNOSIS — E113292 Type 2 diabetes mellitus with mild nonproliferative diabetic retinopathy without macular edema, left eye: Secondary | ICD-10-CM | POA: Diagnosis not present

## 2021-12-22 DIAGNOSIS — H35033 Hypertensive retinopathy, bilateral: Secondary | ICD-10-CM | POA: Diagnosis not present

## 2021-12-22 DIAGNOSIS — H04123 Dry eye syndrome of bilateral lacrimal glands: Secondary | ICD-10-CM | POA: Diagnosis not present

## 2021-12-22 DIAGNOSIS — H2513 Age-related nuclear cataract, bilateral: Secondary | ICD-10-CM | POA: Diagnosis not present

## 2021-12-26 DIAGNOSIS — G8918 Other acute postprocedural pain: Secondary | ICD-10-CM | POA: Diagnosis not present

## 2021-12-26 DIAGNOSIS — M1811 Unilateral primary osteoarthritis of first carpometacarpal joint, right hand: Secondary | ICD-10-CM | POA: Diagnosis not present

## 2022-01-08 DIAGNOSIS — Z4789 Encounter for other orthopedic aftercare: Secondary | ICD-10-CM | POA: Diagnosis not present

## 2022-01-08 DIAGNOSIS — H2511 Age-related nuclear cataract, right eye: Secondary | ICD-10-CM | POA: Diagnosis not present

## 2022-01-08 DIAGNOSIS — M1811 Unilateral primary osteoarthritis of first carpometacarpal joint, right hand: Secondary | ICD-10-CM | POA: Diagnosis not present

## 2022-01-08 DIAGNOSIS — H2513 Age-related nuclear cataract, bilateral: Secondary | ICD-10-CM | POA: Diagnosis not present

## 2022-01-15 DIAGNOSIS — C44329 Squamous cell carcinoma of skin of other parts of face: Secondary | ICD-10-CM | POA: Diagnosis not present

## 2022-01-20 DIAGNOSIS — H2511 Age-related nuclear cataract, right eye: Secondary | ICD-10-CM | POA: Diagnosis not present

## 2022-01-22 DIAGNOSIS — M79641 Pain in right hand: Secondary | ICD-10-CM | POA: Diagnosis not present

## 2022-01-22 DIAGNOSIS — Z4789 Encounter for other orthopedic aftercare: Secondary | ICD-10-CM | POA: Diagnosis not present

## 2022-01-26 DIAGNOSIS — H2512 Age-related nuclear cataract, left eye: Secondary | ICD-10-CM | POA: Diagnosis not present

## 2022-02-05 DIAGNOSIS — M25641 Stiffness of right hand, not elsewhere classified: Secondary | ICD-10-CM | POA: Diagnosis not present

## 2022-02-17 DIAGNOSIS — N390 Urinary tract infection, site not specified: Secondary | ICD-10-CM | POA: Diagnosis not present

## 2022-02-17 DIAGNOSIS — J069 Acute upper respiratory infection, unspecified: Secondary | ICD-10-CM | POA: Diagnosis not present

## 2022-02-19 DIAGNOSIS — Z4789 Encounter for other orthopedic aftercare: Secondary | ICD-10-CM | POA: Diagnosis not present

## 2022-02-19 DIAGNOSIS — M25641 Stiffness of right hand, not elsewhere classified: Secondary | ICD-10-CM | POA: Diagnosis not present

## 2022-02-25 DIAGNOSIS — M5136 Other intervertebral disc degeneration, lumbar region: Secondary | ICD-10-CM | POA: Diagnosis not present

## 2022-02-25 DIAGNOSIS — M9903 Segmental and somatic dysfunction of lumbar region: Secondary | ICD-10-CM | POA: Diagnosis not present

## 2022-02-25 DIAGNOSIS — M9902 Segmental and somatic dysfunction of thoracic region: Secondary | ICD-10-CM | POA: Diagnosis not present

## 2022-02-25 DIAGNOSIS — M5134 Other intervertebral disc degeneration, thoracic region: Secondary | ICD-10-CM | POA: Diagnosis not present

## 2022-02-25 NOTE — Progress Notes (Deleted)
Seba Dalkai [place] Phone: [phone] Subjective:    I'm seeing this patient by the request  of:  Kelton Pillar, MD  CC:   [id number]  02/27/2022 MAI LONGNECKER is a 66 y.o. female coming in with complaint of R hip pain. Last seen in 2021 for [facility] jt arthritis. Patient state s      Past Medical History:  Diagnosis Date   Anxiety    Depression    Encephalitis    Hepatitis    ? B when age 39   HTN (hypertension) 09/28/2013   Hypertension    Migraines    Trigeminal neuralgia    Past Surgical History:  Procedure Laterality Date   BRAIN SURGERY  2013   trigeminal neuralgia   BREAST SURGERY     age 2-benign,   COLONOSCOPY WITH PROPOFOL N/A 08/01/2013   Procedure: COLONOSCOPY WITH PROPOFOL;  Surgeon: Garlan Fair, MD;  Location: [facility];  Service: Endoscopy;  Laterality: N/A;   CRANIOTOMY     gamma knife     x1   Social History   Socioeconomic History   Marital status: Married    Spouse name: Not on file   Number of children: Not on file   Years of education: Not on file   Highest education level: Not on file  Occupational History   Occupation: Nurse  Tobacco Use   Smoking status: Never   Smokeless tobacco: Never  Vaping Use   Vaping Use: Never used  Substance and Sexual Activity   Alcohol use: Yes    Comment: occassionally wine   Drug use: No   Sexual activity: Not on file  Other Topics Concern   Not on file  Social History Narrative   Lives with husband, Richardson Landry   Caffeine use: 2 cups coffee per day   Right handed    Social Determinants of Health   Financial Resource Strain: Not on file  Food Insecurity: Not on file  Transportation Needs: Not on file  Physical Activity: Not on file  Stress: Not on file  Social Connections: Not on file   Allergies  Allergen Reactions   Amlodipine Swelling   Lisinopril Cough   Family History  Problem Relation Age of Onset   Arthritis  Mother    Hyperlipidemia Mother    Heart disease Mother    Stroke Mother    Hypertension Mother    Diabetes Mother    Arthritis Father    Hyperlipidemia Father    Heart disease Father    Stroke Father    Hypertension Father    Diabetes Father    Cancer Maternal Grandmother    Kidney disease Maternal Grandmother    Mental illness Maternal Grandmother    Hypertension Maternal Grandmother    Heart disease Maternal Grandmother    Cancer Maternal Grandfather    Kidney disease Maternal Grandfather    Mental illness Maternal Grandfather    Hypertension Maternal Grandfather    Heart disease Maternal Grandfather    Cancer Paternal Grandmother    Kidney disease Paternal Grandmother    Mental illness Paternal Grandmother    Hypertension Paternal Grandmother    Heart disease Paternal Grandmother    Cancer Paternal Grandfather    Kidney disease Paternal Grandfather    Mental illness Paternal Grandfather    Hypertension Paternal Grandfather    Heart disease Paternal Grandfather     Current Outpatient Medications (Endocrine & Metabolic):    metFORMIN (  GLUCOPHAGE) 500 MG tablet, Take 500 mg by mouth daily with breakfast.   predniSONE (DELTASONE) 50 MG tablet, 1 tablet by mouth daily   predniSONE (DELTASONE) 50 MG tablet, Take one tablet daily for the next 5 days.   predniSONE (DELTASONE) 50 MG tablet, Take one tablet daily for the next 5 days.  Current Outpatient Medications (Cardiovascular):    amLODipine (NORVASC) 5 MG tablet, Take 5 mg by mouth daily.   hydrochlorothiazide (HYDRODIURIL) 25 MG tablet, Take 1 tablet (25 mg total) by mouth daily.   losartan (COZAAR) 100 MG tablet, Take 100 mg by mouth daily.   metoprolol succinate (TOPROL-XL) 100 MG 24 hr tablet, Take 100 mg by mouth every evening. Take with or immediately following a meal.  Current Outpatient Medications (Respiratory):    montelukast (SINGULAIR) 10 MG tablet, TAKE 1 TABLET BY MOUTH ONCE DAILY EVERY EVENING   PROAIR  HFA 108 (90 Base) MCG/ACT inhaler, Inhale 2 puffs into the lungs every 6 (six) hours as needed.  Current Outpatient Medications (Analgesics):    aspirin EC 81 MG tablet, Take 81 mg by mouth daily.   ibuprofen (ADVIL) 800 MG tablet, Take 1 tablet (800 mg total) by mouth every 8 (eight) hours as needed.   Ibuprofen-Famotidine 800-26.6 MG TABS, Take 1 tablet 3 times daily.   oxyCODONE-acetaminophen (PERCOCET/ROXICET) 5-325 MG tablet, Take 1 tablet by mouth every 6 (six) hours as needed. for pain   Current Outpatient Medications (Other):    ALPRAZolam (XANAX) 0.5 MG tablet, Take 0.5 mg by mouth every 6 (six) hours as needed.   citalopram (CELEXA) 40 MG tablet, Take 40 mg by mouth daily.   fish oil-omega-3 fatty acids 1000 MG capsule, Take 2 g by mouth daily.   gabapentin (NEURONTIN) 600 MG tablet, Take 300 mg by mouth 3 (three) times daily.    Glucosamine 500 MG CAPS, Take 1 capsule by mouth daily.   levETIRAcetam (KEPPRA) 500 MG tablet, Take 1 tablet (500 mg total) by mouth 2 (two) times daily.   Multiple Vitamin (MULTIVITAMIN WITH MINERALS) TABS tablet, Take 1 tablet by mouth daily.   ondansetron (ZOFRAN ODT) 4 MG disintegrating tablet, Take 1 tablet (4 mg total) by mouth every 8 (eight) hours as needed for nausea or vomiting.   tiZANidine (ZANAFLEX) 4 MG tablet, Take 1 tablet (4 mg total) by mouth at bedtime.   tiZANidine (ZANAFLEX) 4 MG tablet, Take 1 tablet (4 mg total) by mouth at bedtime.   Vitamin D, Ergocalciferol, (DRISDOL) 1.25 MG (50000 UT) CAPS capsule, Take 1 capsule (50,000 Units total) by mouth every 7 (seven) days.   vitamin E 400 UNIT capsule, Take 400 Units by mouth daily.   Reviewed prior external information including notes and imaging from  primary care provider As well as notes that were available from care everywhere and other healthcare systems.  Past medical history, social, surgical and family history all reviewed in electronic medical record.  No pertanent  information unless stated regarding to the chief complaint.   Review of Systems:  No headache, visual changes, nausea, vomiting, diarrhea, constipation, dizziness, abdominal pain, skin rash, fevers, chills, night sweats, weight loss, swollen lymph nodes, body aches, joint swelling, chest pain, shortness of breath, mood changes. POSITIVE muscle aches  Objective  There were no vitals taken for this visit.   General: No apparent distress alert and oriented x3 mood and affect normal, dressed appropriately.  HEENT: Pupils equal, extraocular movements intact  Respiratory: Patient's speak in full sentences and does not  appear short of breath  Cardiovascular: No lower extremity edema, non tender, no erythema  Gait normal with good balance and coordination.  MSK:  Non tender with full range of motion and good stability and symmetric strength and tone of shoulders, elbows, wrist, hip, knee and ankles bilaterally.     Impression and Recommendations:     The above documentation has been reviewed and is accurate and complete Jacqualin Combes

## 2022-02-27 ENCOUNTER — Ambulatory Visit: Payer: Medicare Other | Admitting: Family Medicine

## 2022-02-27 NOTE — Progress Notes (Signed)
?Charlann Boxer D.O. ?[facility] ?[place] ?Phone: [phone] ?Subjective:   ? ?I'm seeing this patient by the request  of:  Kelton Pillar, MD ? ?CC: Neck, back pain follow-up worsening right hip pain ? ?[id number]  ?Rhonda [name] is a 66 y.o. female coming in with complaint of R hip pain. Patient states that 2 months ago she started feeling a clicking in R SI joint and now she is having soreness in lateral R hip. Having hard time sleeping on R side. No history of pain in either of these areas. Denies any radaiting symptomes.  ? ? ? ?Past Medical History:  ?Diagnosis Date  ? Anxiety   ? Depression   ? Encephalitis   ? Hepatitis   ? ? B when age 72  ? HTN (hypertension) 09/28/2013  ? Hypertension   ? Migraines   ? Trigeminal neuralgia   ? ?Past Surgical History:  ?Procedure Laterality Date  ? BRAIN SURGERY  2013  ? trigeminal neuralgia  ? BREAST SURGERY    ? age 66-benign,  ? COLONOSCOPY WITH PROPOFOL N/A 08/01/2013  ? Procedure: COLONOSCOPY WITH PROPOFOL;  Surgeon: Garlan Fair, MD;  Location: [facility];  Service: Endoscopy;  Laterality: N/A;  ? CRANIOTOMY    ? gamma knife    ? x1  ? ?Social History  ? ?Socioeconomic History  ? Marital status: Married  ?  Spouse name: Not on file  ? Number of children: Not on file  ? Years of education: Not on file  ? Highest education level: Not on file  ?Occupational History  ? Occupation: Nurse  ?Tobacco Use  ? Smoking status: Never  ? Smokeless tobacco: Never  ?Vaping Use  ? Vaping Use: Never used  ?Substance and Sexual Activity  ? Alcohol use: Yes  ?  Comment: occassionally wine  ? Drug use: No  ? Sexual activity: Not on file  ?Other Topics Concern  ? Not on file  ?Social History Narrative  ? Lives with husband, Richardson Landry  ? Caffeine use: 2 cups coffee per day  ? Right handed   ? ?Social Determinants of Health  ? ?Financial Resource Strain: Not on file  ?Food Insecurity: Not on file  ?Transportation Needs: Not on file  ?Physical  Activity: Not on file  ?Stress: Not on file  ?Social Connections: Not on file  ? ?Allergies  ?Allergen Reactions  ? Amlodipine Swelling  ? Lisinopril Cough  ? ?Family History  ?Problem Relation Age of Onset  ? Arthritis Mother   ? Hyperlipidemia Mother   ? Heart disease Mother   ? Stroke Mother   ? Hypertension Mother   ? Diabetes Mother   ? Arthritis Father   ? Hyperlipidemia Father   ? Heart disease Father   ? Stroke Father   ? Hypertension Father   ? Diabetes Father   ? Cancer Maternal Grandmother   ? Kidney disease Maternal Grandmother   ? Mental illness Maternal Grandmother   ? Hypertension Maternal Grandmother   ? Heart disease Maternal Grandmother   ? Cancer Maternal Grandfather   ? Kidney disease Maternal Grandfather   ? Mental illness Maternal Grandfather   ? Hypertension Maternal Grandfather   ? Heart disease Maternal Grandfather   ? Cancer Paternal Grandmother   ? Kidney disease Paternal Grandmother   ? Mental illness Paternal Grandmother   ? Hypertension Paternal Grandmother   ? Heart disease Paternal Grandmother   ? Cancer Paternal Grandfather   ?  Kidney disease Paternal Grandfather   ? Mental illness Paternal Grandfather   ? Hypertension Paternal Grandfather   ? Heart disease Paternal Grandfather   ? ? ?Current Outpatient Medications (Endocrine & Metabolic):  ?  metFORMIN (GLUCOPHAGE) 500 MG tablet, Take 500 mg by mouth daily with breakfast. ?  predniSONE (DELTASONE) 50 MG tablet, 1 tablet by mouth daily ?  predniSONE (DELTASONE) 50 MG tablet, Take one tablet daily for the next 5 days. ?  predniSONE (DELTASONE) 50 MG tablet, Take one tablet daily for the next 5 days. ? ?Current Outpatient Medications (Cardiovascular):  ?  amLODipine (NORVASC) 5 MG tablet, Take 5 mg by mouth daily. ?  hydrochlorothiazide (HYDRODIURIL) 25 MG tablet, Take 1 tablet (25 mg total) by mouth daily. ?  losartan (COZAAR) 100 MG tablet, Take 100 mg by mouth daily. ?  metoprolol succinate (TOPROL-XL) 100 MG 24 hr tablet, Take 100  mg by mouth every evening. Take with or immediately following a meal. ? ?Current Outpatient Medications (Respiratory):  ?  montelukast (SINGULAIR) 10 MG tablet, TAKE 1 TABLET BY MOUTH ONCE DAILY EVERY EVENING ?  PROAIR HFA 108 (90 Base) MCG/ACT inhaler, Inhale 2 puffs into the lungs every 6 (six) hours as needed. ? ?Current Outpatient Medications (Analgesics):  ?  aspirin EC 81 MG tablet, Take 81 mg by mouth daily. ?  ibuprofen (ADVIL) 800 MG tablet, Take 1 tablet (800 mg total) by mouth every 8 (eight) hours as needed. ?  Ibuprofen-Famotidine 800-26.6 MG TABS, Take 1 tablet 3 times daily. ?  oxyCODONE-acetaminophen (PERCOCET/ROXICET) 5-325 MG tablet, Take 1 tablet by mouth every 6 (six) hours as needed. for pain ? ? ?Current Outpatient Medications (Other):  ?  ALPRAZolam (XANAX) 0.5 MG tablet, Take 0.5 mg by mouth every 6 (six) hours as needed. ?  citalopram (CELEXA) 40 MG tablet, Take 40 mg by mouth daily. ?  fish oil-omega-3 fatty acids 1000 MG capsule, Take 2 g by mouth daily. ?  gabapentin (NEURONTIN) 600 MG tablet, Take 300 mg by mouth 3 (three) times daily.  ?  Glucosamine 500 MG CAPS, Take 1 capsule by mouth daily. ?  levETIRAcetam (KEPPRA) 500 MG tablet, Take 1 tablet (500 mg total) by mouth 2 (two) times daily. ?  Multiple Vitamin (MULTIVITAMIN WITH MINERALS) TABS tablet, Take 1 tablet by mouth daily. ?  ondansetron (ZOFRAN ODT) 4 MG disintegrating tablet, Take 1 tablet (4 mg total) by mouth every 8 (eight) hours as needed for nausea or vomiting. ?  tiZANidine (ZANAFLEX) 4 MG tablet, Take 1 tablet (4 mg total) by mouth at bedtime. ?  tiZANidine (ZANAFLEX) 4 MG tablet, Take 1 tablet (4 mg total) by mouth at bedtime. ?  Vitamin D, Ergocalciferol, (DRISDOL) 1.25 MG (50000 UT) CAPS capsule, Take 1 capsule (50,000 Units total) by mouth every 7 (seven) days. ?  vitamin E 400 UNIT capsule, Take 400 Units by mouth daily. ? ? ?Reviewed prior external information including notes and imaging from  ?primary care  provider ?As well as notes that were available from care everywhere and other healthcare systems. ? ?Past medical history, social, surgical and family history all reviewed in electronic medical record.  No pertanent information unless stated regarding to the chief complaint.  ? ?Review of Systems: ? No headache, visual changes, nausea, vomiting, diarrhea, constipation, dizziness, abdominal pain, skin rash, fevers, chills, night sweats, weight loss, swollen lymph nodes, body aches, joint swelling, chest pain, shortness of breath, mood changes. POSITIVE muscle aches ? ?Objective  ?Blood pressure 124/88,  pulse 81, height '5\' 2"'$  (1.575 m), weight 174 lb (78.9 kg), SpO2 98 %. ?  ?General: No apparent distress alert and oriented x3 mood and affect normal, dressed appropriately.  ?HEENT: Pupils equal, extraocular movements intact  ?Respiratory: Patient's speak in full sentences and does not appear short of breath  ?Cardiovascular: No lower extremity edema, non tender, no erythema  ?Gait normal with good balance and coordination.  ?MSK: Right hip exam shows severe tenderness to palpation of the greater trochanteric area.  Tightness noted around the right sacroiliac joint as well.  Negative straight leg test.  Patient does have tightness with FABER test more on the lateral aspect of the hip. ? ?Osteopathic findings ?C4 flexed rotated and side bent left ?T5 extended rotated and side bent right inhaled third rib ?L4 flexed rotated and side bent right ?Sacrum right on right ? ? ?Procedure: Real-time Ultrasound Guided Injection of right greater trochanteric bursitis secondary to patient's body habitus ?Device: GE Logiq Q7 ?Ultrasound guided injection is preferred based studies that show increased duration, increased effect, greater accuracy, decreased procedural pain, increased response rate, and decreased cost with ultrasound guided versus blind injection.  ?Verbal informed consent obtained.  ?Time-out conducted.  ?Noted no  overlying erythema, induration, or other signs of local infection.  ?Skin prepped in a sterile fashion.  ?Local anesthesia: Topical Ethyl chloride.  ?With sterile technique and under real time ultrasound guida

## 2022-03-02 ENCOUNTER — Encounter: Payer: Self-pay | Admitting: Family Medicine

## 2022-03-02 ENCOUNTER — Ambulatory Visit: Payer: Medicare Other | Admitting: Family Medicine

## 2022-03-02 VITALS — BP 124/88 | HR 81 | Ht 62.0 in | Wt 174.0 lb

## 2022-03-02 DIAGNOSIS — G8929 Other chronic pain: Secondary | ICD-10-CM | POA: Diagnosis not present

## 2022-03-02 DIAGNOSIS — M546 Pain in thoracic spine: Secondary | ICD-10-CM | POA: Diagnosis not present

## 2022-03-02 DIAGNOSIS — M9904 Segmental and somatic dysfunction of sacral region: Secondary | ICD-10-CM | POA: Diagnosis not present

## 2022-03-02 DIAGNOSIS — M7061 Trochanteric bursitis, right hip: Secondary | ICD-10-CM | POA: Diagnosis not present

## 2022-03-02 DIAGNOSIS — M9902 Segmental and somatic dysfunction of thoracic region: Secondary | ICD-10-CM | POA: Diagnosis not present

## 2022-03-02 DIAGNOSIS — M9903 Segmental and somatic dysfunction of lumbar region: Secondary | ICD-10-CM

## 2022-03-02 DIAGNOSIS — M999 Biomechanical lesion, unspecified: Secondary | ICD-10-CM

## 2022-03-02 DIAGNOSIS — M9901 Segmental and somatic dysfunction of cervical region: Secondary | ICD-10-CM | POA: Diagnosis not present

## 2022-03-02 DIAGNOSIS — M9908 Segmental and somatic dysfunction of rib cage: Secondary | ICD-10-CM

## 2022-03-02 NOTE — Assessment & Plan Note (Signed)
Patient given injection today and tolerated the procedure well.  Hopefully this will make significant improvement.  Differential includes a lumbar radiculopathy and we will need to consider the possibility of imaging if this continues.  Patient has had multiple different medications over the course of time and can do anti-inflammatories.  Patient will follow-up with me again in 6 to 8 weeks otherwise ?

## 2022-03-02 NOTE — Assessment & Plan Note (Signed)
Chronic problem with mild exacerbation.  Does respond well to osteopathic manipulation.  Did have tightness more in the lumbar spine that is likely contributing to some of the discomfort as well.  Patient did have some mild radicular symptoms but pain seem to be more associated with the greater trochanteric area.  Was given an injection today tolerated the procedure well patient given home exercises and will follow-up again in 6 to 8 weeks. ?

## 2022-03-02 NOTE — Patient Instructions (Signed)
Injected hip today ?Exercises 3x a week ?See me again in 6-8 weeks ?

## 2022-03-02 NOTE — Assessment & Plan Note (Signed)

## 2022-03-09 DIAGNOSIS — M25641 Stiffness of right hand, not elsewhere classified: Secondary | ICD-10-CM | POA: Diagnosis not present

## 2022-03-11 DIAGNOSIS — M25641 Stiffness of right hand, not elsewhere classified: Secondary | ICD-10-CM | POA: Diagnosis not present

## 2022-03-17 DIAGNOSIS — H25812 Combined forms of age-related cataract, left eye: Secondary | ICD-10-CM | POA: Diagnosis not present

## 2022-03-17 DIAGNOSIS — H2512 Age-related nuclear cataract, left eye: Secondary | ICD-10-CM | POA: Diagnosis not present

## 2022-03-18 ENCOUNTER — Ambulatory Visit: Payer: Medicare Other | Admitting: Family Medicine

## 2022-03-18 DIAGNOSIS — G43909 Migraine, unspecified, not intractable, without status migrainosus: Secondary | ICD-10-CM | POA: Diagnosis not present

## 2022-03-18 DIAGNOSIS — I10 Essential (primary) hypertension: Secondary | ICD-10-CM | POA: Diagnosis not present

## 2022-03-18 DIAGNOSIS — Z23 Encounter for immunization: Secondary | ICD-10-CM | POA: Diagnosis not present

## 2022-03-18 DIAGNOSIS — Z0000 Encounter for general adult medical examination without abnormal findings: Secondary | ICD-10-CM | POA: Diagnosis not present

## 2022-03-18 DIAGNOSIS — K219 Gastro-esophageal reflux disease without esophagitis: Secondary | ICD-10-CM | POA: Diagnosis not present

## 2022-03-18 DIAGNOSIS — G479 Sleep disorder, unspecified: Secondary | ICD-10-CM | POA: Diagnosis not present

## 2022-03-18 DIAGNOSIS — J309 Allergic rhinitis, unspecified: Secondary | ICD-10-CM | POA: Diagnosis not present

## 2022-03-18 DIAGNOSIS — G500 Trigeminal neuralgia: Secondary | ICD-10-CM | POA: Diagnosis not present

## 2022-03-18 DIAGNOSIS — E1169 Type 2 diabetes mellitus with other specified complication: Secondary | ICD-10-CM | POA: Diagnosis not present

## 2022-03-18 DIAGNOSIS — E7800 Pure hypercholesterolemia, unspecified: Secondary | ICD-10-CM | POA: Diagnosis not present

## 2022-03-24 DIAGNOSIS — M25641 Stiffness of right hand, not elsewhere classified: Secondary | ICD-10-CM | POA: Diagnosis not present

## 2022-04-09 DIAGNOSIS — Z4789 Encounter for other orthopedic aftercare: Secondary | ICD-10-CM | POA: Diagnosis not present

## 2022-06-24 DIAGNOSIS — M9904 Segmental and somatic dysfunction of sacral region: Secondary | ICD-10-CM | POA: Diagnosis not present

## 2022-06-24 DIAGNOSIS — M9905 Segmental and somatic dysfunction of pelvic region: Secondary | ICD-10-CM | POA: Diagnosis not present

## 2022-06-24 DIAGNOSIS — M9903 Segmental and somatic dysfunction of lumbar region: Secondary | ICD-10-CM | POA: Diagnosis not present

## 2022-06-24 DIAGNOSIS — M5136 Other intervertebral disc degeneration, lumbar region: Secondary | ICD-10-CM | POA: Diagnosis not present

## 2022-08-12 NOTE — Progress Notes (Unsigned)
[place] Phone: [phone] Subjective:   [name], am serving as a scribe for Dr. Hulan Saas.  I'm seeing this patient by the request  of:  Kelton Pillar, MD  CC: bilateral knee pain and back pain   NOB:[id number]  Rhonda [name] is a 66 y.o. female coming in with complaint of B knee pain. Last seen for OMT in April 2023. Viscosupplementation given in 2021. Patient states that her knees are achy.   Also c/o pain in R side of neck. Painful to sleep at night.      Past Medical History:  Diagnosis Date   Anxiety    Depression    Encephalitis    Hepatitis    ? B when age 43   HTN (hypertension) 09/28/2013   Hypertension    Migraines    Trigeminal neuralgia    Past Surgical History:  Procedure Laterality Date   BRAIN SURGERY  2013   trigeminal neuralgia   BREAST SURGERY     age 81-benign,   COLONOSCOPY WITH PROPOFOL N/A 08/01/2013   Procedure: COLONOSCOPY WITH PROPOFOL;  Surgeon: Garlan Fair, MD;  Location: [facility];  Service: Endoscopy;  Laterality: N/A;   CRANIOTOMY     gamma knife     x1   Social History   Socioeconomic History   Marital status: Married    Spouse name: Not on file   Number of children: Not on file   Years of education: Not on file   Highest education level: Not on file  Occupational History   Occupation: Nurse  Tobacco Use   Smoking status: Never   Smokeless tobacco: Never  Vaping Use   Vaping Use: Never used  Substance and Sexual Activity   Alcohol use: Yes    Comment: occassionally wine   Drug use: No   Sexual activity: Not on file  Other Topics Concern   Not on file  Social History Narrative   Lives with husband, Richardson Landry   Caffeine use: 2 cups coffee per day   Right handed    Social Determinants of Health   Financial Resource Strain: Not on file  Food Insecurity: Not on file  Transportation Needs: Not on file  Physical Activity: Not on  file  Stress: Not on file  Social Connections: Not on file   Allergies  Allergen Reactions   Amlodipine Swelling   Lisinopril Cough   Family History  Problem Relation Age of Onset   Arthritis Mother    Hyperlipidemia Mother    Heart disease Mother    Stroke Mother    Hypertension Mother    Diabetes Mother    Arthritis Father    Hyperlipidemia Father    Heart disease Father    Stroke Father    Hypertension Father    Diabetes Father    Cancer Maternal Grandmother    Kidney disease Maternal Grandmother    Mental illness Maternal Grandmother    Hypertension Maternal Grandmother    Heart disease Maternal Grandmother    Cancer Maternal Grandfather    Kidney disease Maternal Grandfather    Mental illness Maternal Grandfather    Hypertension Maternal Grandfather    Heart disease Maternal Grandfather    Cancer Paternal Grandmother    Kidney disease Paternal Grandmother    Mental illness Paternal Grandmother    Hypertension Paternal Grandmother    Heart disease Paternal Grandmother    Cancer Paternal Grandfather  Kidney disease Paternal Grandfather    Mental illness Paternal Grandfather    Hypertension Paternal Grandfather    Heart disease Paternal Grandfather     Current Outpatient Medications (Endocrine & Metabolic):    metFORMIN (GLUCOPHAGE) 500 MG tablet, Take 500 mg by mouth daily with breakfast.   predniSONE (DELTASONE) 50 MG tablet, 1 tablet by mouth daily   predniSONE (DELTASONE) 50 MG tablet, Take one tablet daily for the next 5 days.   predniSONE (DELTASONE) 50 MG tablet, Take one tablet daily for the next 5 days.  Current Outpatient Medications (Cardiovascular):    amLODipine (NORVASC) 5 MG tablet, Take 5 mg by mouth daily.   hydrochlorothiazide (HYDRODIURIL) 25 MG tablet, Take 1 tablet (25 mg total) by mouth daily.   losartan (COZAAR) 100 MG tablet, Take 100 mg by mouth daily.   metoprolol succinate (TOPROL-XL) 100 MG 24 hr tablet, Take 100 mg by mouth every  evening. Take with or immediately following a meal.  Current Outpatient Medications (Respiratory):    montelukast (SINGULAIR) 10 MG tablet, TAKE 1 TABLET BY MOUTH ONCE DAILY EVERY EVENING   PROAIR HFA 108 (90 Base) MCG/ACT inhaler, Inhale 2 puffs into the lungs every 6 (six) hours as needed.  Current Outpatient Medications (Analgesics):    aspirin EC 81 MG tablet, Take 81 mg by mouth daily.   ibuprofen (ADVIL) 800 MG tablet, Take 1 tablet (800 mg total) by mouth every 8 (eight) hours as needed.   Ibuprofen-Famotidine 800-26.6 MG TABS, Take 1 tablet 3 times daily.   oxyCODONE-acetaminophen (PERCOCET/ROXICET) 5-325 MG tablet, Take 1 tablet by mouth every 6 (six) hours as needed. for pain   Current Outpatient Medications (Other):    ALPRAZolam (XANAX) 0.5 MG tablet, Take 0.5 mg by mouth every 6 (six) hours as needed.   citalopram (CELEXA) 40 MG tablet, Take 40 mg by mouth daily.   fish oil-omega-3 fatty acids 1000 MG capsule, Take 2 g by mouth daily.   gabapentin (NEURONTIN) 600 MG tablet, Take 300 mg by mouth 3 (three) times daily.    Glucosamine 500 MG CAPS, Take 1 capsule by mouth daily.   levETIRAcetam (KEPPRA) 500 MG tablet, Take 1 tablet (500 mg total) by mouth 2 (two) times daily.   Multiple Vitamin (MULTIVITAMIN WITH MINERALS) TABS tablet, Take 1 tablet by mouth daily.   ondansetron (ZOFRAN ODT) 4 MG disintegrating tablet, Take 1 tablet (4 mg total) by mouth every 8 (eight) hours as needed for nausea or vomiting.   tiZANidine (ZANAFLEX) 4 MG tablet, Take 1 tablet (4 mg total) by mouth at bedtime.   tiZANidine (ZANAFLEX) 4 MG tablet, Take 1 tablet (4 mg total) by mouth at bedtime.   traZODone (DESYREL) 50 MG tablet, Take 0.5-1 tablets (25-50 mg total) by mouth at bedtime as needed for sleep.   Vitamin D, Ergocalciferol, (DRISDOL) 1.25 MG (50000 UT) CAPS capsule, Take 1 capsule (50,000 Units total) by mouth every 7 (seven) days.   vitamin E 400 UNIT capsule, Take 400 Units by mouth  daily.   Reviewed prior external information including notes and imaging from  primary care provider As well as notes that were available from care everywhere and other healthcare systems.  Past medical history, social, surgical and family history all reviewed in electronic medical record.  No pertanent information unless stated regarding to the chief complaint.   Review of Systems:  No visual changes, nausea, vomiting, diarrhea, constipation, dizziness, abdominal pain, skin rash, fevers, chills, night sweats, weight loss, swollen lymph nodes,  body aches, joint swelling, chest pain, shortness of breath, mood changes. POSITIVE muscle aches, headache  Objective  Blood pressure 112/78, pulse 72, height '5\' 2"'$  (1.575 m), weight 161 lb (73 kg), SpO2 98 %.   General: No apparent distress alert and oriented x3 mood and affect normal, dressed appropriately.  HEENT: Pupils equal, extraocular movements intact  Respiratory: Patient's speak in full sentences and does not appear short of breath  Cardiovascular: No lower extremity edema, non tender, no erythema   Back exam does have significant tightness on the right side more than usual.  Low back exam does have some tenderness to palpation as well.  Patient does have some limited sidebending bilaterally.  Significant arthritic changes of the knees bilaterally.  No crepitus noted.  Patient does have significant swelling noted.  After informed written and verbal consent, patient was seated on exam table. Right knee was prepped with alcohol swab and utilizing anterolateral approach, patient's right knee space was injected with 4:1  marcaine 0.5%: Kenalog '40mg'$ /dL. Patient tolerated the procedure well without immediate complications.  After informed written and verbal consent, patient was seated on exam table. Left knee was prepped with alcohol swab and utilizing anterolateral approach, patient's left knee space was injected with 4:1  marcaine 0.5%: Kenalog  '40mg'$ /dL. Patient tolerated the procedure well without immediate complications.  Osteopathic findings C2 flexed rotated and side bent right C4 flexed rotated and side bent left C6 flexed rotated and side bent right T3 extended rotated and side bent right inhaled third rib T9 extended rotated and side bent right L3 flexed rotated and side bent right Sacrum right on right     Impression and Recommendations:     The above documentation has been reviewed and is accurate and complete Lyndal Pulley, DO

## 2022-08-13 ENCOUNTER — Encounter: Payer: Self-pay | Admitting: Family Medicine

## 2022-08-13 ENCOUNTER — Ambulatory Visit: Payer: Medicare Other | Admitting: Family Medicine

## 2022-08-13 VITALS — BP 112/78 | HR 72 | Ht 62.0 in | Wt 161.0 lb

## 2022-08-13 DIAGNOSIS — M9904 Segmental and somatic dysfunction of sacral region: Secondary | ICD-10-CM

## 2022-08-13 DIAGNOSIS — M170 Bilateral primary osteoarthritis of knee: Secondary | ICD-10-CM

## 2022-08-13 DIAGNOSIS — M999 Biomechanical lesion, unspecified: Secondary | ICD-10-CM | POA: Diagnosis not present

## 2022-08-13 DIAGNOSIS — G8929 Other chronic pain: Secondary | ICD-10-CM

## 2022-08-13 DIAGNOSIS — M546 Pain in thoracic spine: Secondary | ICD-10-CM

## 2022-08-13 DIAGNOSIS — M9908 Segmental and somatic dysfunction of rib cage: Secondary | ICD-10-CM

## 2022-08-13 DIAGNOSIS — M9903 Segmental and somatic dysfunction of lumbar region: Secondary | ICD-10-CM

## 2022-08-13 DIAGNOSIS — M9901 Segmental and somatic dysfunction of cervical region: Secondary | ICD-10-CM | POA: Diagnosis not present

## 2022-08-13 DIAGNOSIS — M9902 Segmental and somatic dysfunction of thoracic region: Secondary | ICD-10-CM | POA: Diagnosis not present

## 2022-08-13 MED ORDER — TRAZODONE HCL 50 MG PO TABS: 25.0000 mg | ORAL_TABLET | Freq: Every evening | ORAL | 3 refills | Status: AC | PRN

## 2022-08-13 NOTE — Patient Instructions (Addendum)
Good to see you Rhonda [name] get gel approved Steroid injections today Trazadone '50mg'$  at night Congrats on weight loss See me again in 4-6 weeks

## 2022-08-13 NOTE — Assessment & Plan Note (Signed)

## 2022-08-13 NOTE — Assessment & Plan Note (Signed)
Continue tightness.  Did have more tightness in the parascapular region that I think was causing more tightness of the neck actually in the occipital region right side.  This could be potentially contributing to some of the mild headaches.  Patient knows though if her headaches worsen to seek medical attention.  Follow-up with me again in 6 to 8 weeks

## 2022-08-13 NOTE — Assessment & Plan Note (Signed)
Given injection today and tolerated the procedure well.  Discussed which activities to do and which ones to avoid.  Has responded to viscosupplementation previously for this chronic, with exacerbation and will see if there is a possibility get this approved so we can possibly do it at follow-up after further evaluation and treatment.

## 2022-09-01 NOTE — Progress Notes (Unsigned)
[place] Phone: [phone] Subjective:   Rhonda [name], am serving as a scribe for Dr. Hulan Saas.  I'm seeing this patient by the request  of:  Kelton Pillar, MD  CC: Knee pain follow-up  back and neck pain follow-up  IRJ:[id number]  Rhonda [name] is a 66 y.o. female coming in with complaint of back and neck pain. OMT on 08/13/2022. Also seen for knee pain, durolane has been approved. Patient states that her knees have been achy. Missteped when going down stairs and had some lateral knee pain on left. Pain has improved though.   Spine has been doing much better. Sat in camping chair for a while yesterday which has increased her pain somewhat.   Medications patient has been prescribed: trazodone  Taking:         Reviewed prior external information including notes and imaging from previsou exam, outside providers and external EMR if available.   As well as notes that were available from care everywhere and other healthcare systems.  Past medical history, social, surgical and family history all reviewed in electronic medical record.  [name] pertanent information unless stated regarding to the chief complaint.   Past Medical History:  Diagnosis Date   Anxiety    Depression    Encephalitis    Hepatitis    ? B when age 65   HTN (hypertension) 09/28/2013   Hypertension    Migraines    Trigeminal neuralgia     Allergies  Allergen Reactions   Amlodipine Swelling   Lisinopril Cough     Review of Systems:  [name] headache, visual changes, nausea, vomiting, diarrhea, constipation, dizziness, abdominal pain, skin rash, fevers, chills, night sweats, weight loss, swollen lymph nodes, body aches, joint swelling, chest pain, shortness of breath, mood changes. POSITIVE muscle aches  Objective  There were [name] vitals taken for this visit.   General: [name] apparent distress alert and oriented x3 mood and affect  normal, dressed appropriately.  HEENT: Pupils equal, extraocular movements intact  Respiratory: Patient's speak in full sentences and does not appear short of breath  Cardiovascular: [name] lower extremity edema, non tender, [name] erythema  Gait MSK:  Back  Knee exam does have effusion of the knees bilaterally.  Lateral tracking of the patella noted.  Patient does have tenderness to palpation of the knees bilaterally on the medial joint line and the patellofemoral joint.  Osteopathic findings  C2 flexed rotated and side bent right C6 flexed rotated and side bent left T3 extended rotated and side bent right inhaled rib T9 extended rotated and side bent left L2 flexed rotated and side bent right Sacrum right on right  After informed written and verbal consent, patient was seated on exam table. Right knee was prepped with alcohol swab and utilizing anterolateral approach, patient's right knee space was injected with '60mg'$  per 3 mL of Durolane (sodium hyaluronate) in a prefilled syringe was injected easily into the knee through a 22-gauge needle..Patient tolerated the procedure well without immediate complications.  After informed written and verbal consent, patient was seated on exam table. Left knee was prepped with alcohol swab and utilizing anterolateral approach, patient's left knee space was injected with 60 mg per 3 mL of Durolane (sodium hyaluronate) in a prefilled syringe was injected easily into the knee through a 22-gauge needle..Patient tolerated the procedure well without immediate complications.     Assessment and Plan:  [name] problem-specific Assessment & Plan  notes found for this encounter.    Nonallopathic problems  Decision today to treat with OMT was based on Physical Exam  After verbal consent patient was treated with HVLA, ME, FPR techniques in cervical, rib, thoracic, lumbar, and sacral  areas  Patient tolerated the procedure well with improvement in symptoms  Patient  given exercises, stretches and lifestyle modifications  See medications in patient instructions if given  Patient will follow up in 4-8 weeks     The above documentation has been reviewed and is accurate and complete Lyndal Pulley, DO         Note: This dictation was prepared with Dragon dictation along with smaller phrase technology. Any transcriptional errors that result from this process are unintentional.

## 2022-09-07 ENCOUNTER — Ambulatory Visit: Payer: Medicare Other | Admitting: Family Medicine

## 2022-09-07 VITALS — BP 128/84 | HR 71 | Ht 62.0 in | Wt 158.0 lb

## 2022-09-07 DIAGNOSIS — M9901 Segmental and somatic dysfunction of cervical region: Secondary | ICD-10-CM

## 2022-09-07 DIAGNOSIS — M170 Bilateral primary osteoarthritis of knee: Secondary | ICD-10-CM

## 2022-09-07 DIAGNOSIS — M9902 Segmental and somatic dysfunction of thoracic region: Secondary | ICD-10-CM | POA: Diagnosis not present

## 2022-09-07 DIAGNOSIS — M9908 Segmental and somatic dysfunction of rib cage: Secondary | ICD-10-CM | POA: Diagnosis not present

## 2022-09-07 DIAGNOSIS — G8929 Other chronic pain: Secondary | ICD-10-CM | POA: Diagnosis not present

## 2022-09-07 DIAGNOSIS — M546 Pain in thoracic spine: Secondary | ICD-10-CM

## 2022-09-07 DIAGNOSIS — M9903 Segmental and somatic dysfunction of lumbar region: Secondary | ICD-10-CM | POA: Diagnosis not present

## 2022-09-07 DIAGNOSIS — M9904 Segmental and somatic dysfunction of sacral region: Secondary | ICD-10-CM | POA: Diagnosis not present

## 2022-09-07 MED ORDER — SODIUM HYALURONATE 60 MG/3ML IX PRSY
120.0000 mg | PREFILLED_SYRINGE | Freq: Once | INTRA_ARTICULAR | Status: AC
  Administered 2022-09-07: 120 mg via INTRA_ARTICULAR

## 2022-09-07 NOTE — Patient Instructions (Signed)
Good to see you Durolane given today bilaterally [place] Hi Thank you for the jelly See me again in 6-8 weeks

## 2022-09-08 NOTE — Assessment & Plan Note (Signed)
Patient's back pain likely some is compensating for the knees.  Discussed with patient about icing regimen and home exercises.  We once again the medications including the gabapentin I think will be beneficial.  Follow-up again in 6 to 8 weeks.

## 2022-09-08 NOTE — Assessment & Plan Note (Signed)
Has responded previously to the viscosupplementation and hopefully we will see proving again.  Discussed icing regimen and home exercises, discussed medications including the 300 mg to 600 mg up to 3 times a day and the Zanaflex 4 mg at night when needed.  Follow-up with me again in 6 to 8 weeks

## 2022-09-16 DIAGNOSIS — M9905 Segmental and somatic dysfunction of pelvic region: Secondary | ICD-10-CM | POA: Diagnosis not present

## 2022-09-16 DIAGNOSIS — M5136 Other intervertebral disc degeneration, lumbar region: Secondary | ICD-10-CM | POA: Diagnosis not present

## 2022-09-16 DIAGNOSIS — M9904 Segmental and somatic dysfunction of sacral region: Secondary | ICD-10-CM | POA: Diagnosis not present

## 2022-09-16 DIAGNOSIS — M9903 Segmental and somatic dysfunction of lumbar region: Secondary | ICD-10-CM | POA: Diagnosis not present

## 2022-10-06 DIAGNOSIS — Z130 Encounter for screening for diseases of the blood and blood-forming organs and certain disorders involving the immune mechanism: Secondary | ICD-10-CM | POA: Diagnosis not present

## 2022-10-06 DIAGNOSIS — Z13228 Encounter for screening for other metabolic disorders: Secondary | ICD-10-CM | POA: Diagnosis not present

## 2022-10-06 DIAGNOSIS — E559 Vitamin D deficiency, unspecified: Secondary | ICD-10-CM | POA: Diagnosis not present

## 2022-10-06 DIAGNOSIS — Z13220 Encounter for screening for lipoid disorders: Secondary | ICD-10-CM | POA: Diagnosis not present

## 2022-10-06 DIAGNOSIS — R945 Abnormal results of liver function studies: Secondary | ICD-10-CM | POA: Diagnosis not present

## 2022-10-06 DIAGNOSIS — N390 Urinary tract infection, site not specified: Secondary | ICD-10-CM | POA: Diagnosis not present

## 2022-10-06 DIAGNOSIS — Z1211 Encounter for screening for malignant neoplasm of colon: Secondary | ICD-10-CM | POA: Diagnosis not present

## 2022-10-06 DIAGNOSIS — N6002 Solitary cyst of left breast: Secondary | ICD-10-CM | POA: Diagnosis not present

## 2022-10-07 ENCOUNTER — Other Ambulatory Visit: Payer: Self-pay | Admitting: Obstetrics and Gynecology

## 2022-10-07 ENCOUNTER — Encounter: Payer: Self-pay | Admitting: Obstetrics and Gynecology

## 2022-10-07 DIAGNOSIS — N6002 Solitary cyst of left breast: Secondary | ICD-10-CM

## 2022-10-19 DIAGNOSIS — E1169 Type 2 diabetes mellitus with other specified complication: Secondary | ICD-10-CM | POA: Diagnosis not present

## 2022-10-19 DIAGNOSIS — E7800 Pure hypercholesterolemia, unspecified: Secondary | ICD-10-CM | POA: Diagnosis not present

## 2022-10-19 DIAGNOSIS — G479 Sleep disorder, unspecified: Secondary | ICD-10-CM | POA: Diagnosis not present

## 2022-10-19 DIAGNOSIS — I10 Essential (primary) hypertension: Secondary | ICD-10-CM | POA: Diagnosis not present

## 2022-10-29 ENCOUNTER — Other Ambulatory Visit: Payer: Self-pay | Admitting: Obstetrics and Gynecology

## 2022-10-29 ENCOUNTER — Ambulatory Visit
Admission: RE | Admit: 2022-10-29 | Discharge: 2022-10-29 | Disposition: A | Discharge status: Home / Self Care | Payer: Medicare Other | Source: Ambulatory Visit | Attending: Obstetrics and Gynecology | Admitting: Obstetrics and Gynecology

## 2022-10-29 ENCOUNTER — Ambulatory Visit: Admission: RE | Admit: 2022-10-29 | Payer: Medicare Other | Source: Ambulatory Visit

## 2022-10-29 DIAGNOSIS — Z1231 Encounter for screening mammogram for malignant neoplasm of breast: Secondary | ICD-10-CM

## 2022-10-29 DIAGNOSIS — N6002 Solitary cyst of left breast: Secondary | ICD-10-CM

## 2022-12-08 DIAGNOSIS — M5136 Other intervertebral disc degeneration, lumbar region: Secondary | ICD-10-CM | POA: Diagnosis not present

## 2022-12-08 DIAGNOSIS — M9903 Segmental and somatic dysfunction of lumbar region: Secondary | ICD-10-CM | POA: Diagnosis not present

## 2022-12-08 DIAGNOSIS — M9904 Segmental and somatic dysfunction of sacral region: Secondary | ICD-10-CM | POA: Diagnosis not present

## 2022-12-08 DIAGNOSIS — M9905 Segmental and somatic dysfunction of pelvic region: Secondary | ICD-10-CM | POA: Diagnosis not present

## 2023-01-26 DIAGNOSIS — M9905 Segmental and somatic dysfunction of pelvic region: Secondary | ICD-10-CM | POA: Diagnosis not present

## 2023-01-26 DIAGNOSIS — M9904 Segmental and somatic dysfunction of sacral region: Secondary | ICD-10-CM | POA: Diagnosis not present

## 2023-01-26 DIAGNOSIS — M9903 Segmental and somatic dysfunction of lumbar region: Secondary | ICD-10-CM | POA: Diagnosis not present

## 2023-01-26 DIAGNOSIS — M5136 Other intervertebral disc degeneration, lumbar region: Secondary | ICD-10-CM | POA: Diagnosis not present

## 2023-02-02 ENCOUNTER — Ambulatory Visit (INDEPENDENT_AMBULATORY_CARE_PROVIDER_SITE_OTHER): Payer: Medicare Other

## 2023-02-02 ENCOUNTER — Ambulatory Visit: Payer: Medicare Other | Admitting: Sports Medicine

## 2023-02-02 VITALS — BP 132/80 | HR 66 | Ht 62.0 in | Wt 158.0 lb

## 2023-02-02 DIAGNOSIS — M79674 Pain in right toe(s): Secondary | ICD-10-CM | POA: Diagnosis not present

## 2023-02-02 DIAGNOSIS — M109 Gout, unspecified: Secondary | ICD-10-CM | POA: Diagnosis not present

## 2023-02-02 DIAGNOSIS — M7731 Calcaneal spur, right foot: Secondary | ICD-10-CM | POA: Diagnosis not present

## 2023-02-02 MED ORDER — MELOXICAM 15 MG PO TABS: 15.0000 mg | ORAL_TABLET | Freq: Every day | ORAL | 0 refills | Status: AC

## 2023-02-02 NOTE — Patient Instructions (Addendum)
Good to see you  - Start meloxicam 15 mg daily x2 weeks.  If still having pain after 2 weeks, complete 3rd-week of meloxicam. May use remaining meloxicam as needed once daily for pain control.  Do not to use additional NSAIDs while taking meloxicam.  May use Tylenol [phone] mg 2 to 3 times a day for breakthrough pain. We suspect that you have gout You may want to speak with PCP about changing your HCTZ medication Recommend follow-up in 3-4 weeks for reevaluation and labwork

## 2023-02-02 NOTE — Progress Notes (Signed)
Benito Mccreedy D.[place] Phone: [phone]   Assessment and Plan:     1. Great toe pain, right 2. Acute gout involving toe of right foot, unspecified cause -Acute, initial sports medicine visit - This represents patient's second flare of right great toe pain with a red, hot, swollen joint without specific MOI.  Patient's presentation, as well as currently being on HCTZ, are most consistent with gout flare - Start meloxicam 15 mg daily x2 weeks.  If still having pain after 2 weeks, complete 3rd-week of meloxicam. May use remaining meloxicam as needed once daily for pain control.  Do not to use additional NSAIDs while taking meloxicam.  May use Tylenol [phone] mg 2 to 3 times a day for breakthrough pain. - Recommend follow-up in 3 to 4 weeks.  If flare has subsided at time, we could check patient's baseline uric acid level to see if patient to be started on allopurinol for long-term maintenance. - Also recommend discussing HCTZ medication with PCP as patient may benefit from changing medications to decrease risk of gout flare - X-ray obtained in clinic.  My interpretation: No acute fracture or dislocation.  Other orders - meloxicam (MOBIC) 15 MG tablet; Take 1 tablet (15 mg total) by mouth daily.    Pertinent previous records reviewed include none   Follow Up: 3 to 4 weeks for reevaluation. If flare has subsided at time, we could check patient's baseline uric acid level to see if patient to be started on allopurinol for long-term maintenance.  If no improvement or worsening of symptoms, could consider ultrasound versus alternative NSAID versus colchicine use.   Subjective:   I, Pincus Badder, am serving as a Education administrator for Doctor Glennon Mac  Chief Complaint: right foot pain   HPI:   02/02/23 Patient is a 67 year old female complaining of right foot pain. Patient states that she woke up with pain 2 days  ago, pain when walking, swollen, intermittent pain, pain under the ball of the toe on the great toe, no numbness or tingling, ibu and tylenol for the pain ibu works the best, no real radiating pain, this happened back in February and turned red then a day later the pain was gone   Relevant Historical Information: Hypertension,  Additional pertinent review of systems negative.   Current Outpatient Medications:    ALPRAZolam (XANAX) 0.5 MG tablet, Take 0.5 mg by mouth every 6 (six) hours as needed., Disp: , Rfl: 0   amLODipine (NORVASC) 5 MG tablet, Take 5 mg by mouth daily., Disp: , Rfl: 12   aspirin EC 81 MG tablet, Take 81 mg by mouth daily., Disp: , Rfl:    citalopram (CELEXA) 40 MG tablet, Take 40 mg by mouth daily., Disp: , Rfl: 12   fish oil-omega-3 fatty acids 1000 MG capsule, Take 2 g by mouth daily., Disp: , Rfl:    gabapentin (NEURONTIN) 600 MG tablet, Take 300 mg by mouth 3 (three) times daily. , Disp: , Rfl: 1   Glucosamine 500 MG CAPS, Take 1 capsule by mouth daily., Disp: , Rfl:    hydrochlorothiazide (HYDRODIURIL) 25 MG tablet, Take 1 tablet (25 mg total) by mouth daily., Disp: 90 tablet, Rfl: 3   ibuprofen (ADVIL) 800 MG tablet, Take 1 tablet (800 mg total) by mouth every 8 (eight) hours as needed., Disp: 30 tablet, Rfl: 0   Ibuprofen-Famotidine 800-26.6 MG TABS, Take 1 tablet 3 times daily., Disp:  270 tablet, Rfl: 3   levETIRAcetam (KEPPRA) 500 MG tablet, Take 1 tablet (500 mg total) by mouth 2 (two) times daily., Disp: 60 tablet, Rfl: 3   losartan (COZAAR) 100 MG tablet, Take 100 mg by mouth daily., Disp: , Rfl: 12   meloxicam (MOBIC) 15 MG tablet, Take 1 tablet (15 mg total) by mouth daily., Disp: 30 tablet, Rfl: 0   metFORMIN (GLUCOPHAGE) 500 MG tablet, Take 500 mg by mouth daily with breakfast., Disp: , Rfl:    metoprolol succinate (TOPROL-XL) 100 MG 24 hr tablet, Take 100 mg by mouth every evening. Take with or immediately following a meal., Disp: , Rfl:    montelukast  (SINGULAIR) 10 MG tablet, TAKE 1 TABLET BY MOUTH ONCE DAILY EVERY EVENING, Disp: , Rfl: 3   Multiple Vitamin (MULTIVITAMIN WITH MINERALS) TABS tablet, Take 1 tablet by mouth daily., Disp: , Rfl:    ondansetron (ZOFRAN ODT) 4 MG disintegrating tablet, Take 1 tablet (4 mg total) by mouth every 8 (eight) hours as needed for nausea or vomiting., Disp: 20 tablet, Rfl: 0   oxyCODONE-acetaminophen (PERCOCET/ROXICET) 5-325 MG tablet, Take 1 tablet by mouth every 6 (six) hours as needed. for pain, Disp: , Rfl: 0   predniSONE (DELTASONE) 50 MG tablet, 1 tablet by mouth daily, Disp: 5 tablet, Rfl: 0   predniSONE (DELTASONE) 50 MG tablet, Take one tablet daily for the next 5 days., Disp: 5 tablet, Rfl: 0   predniSONE (DELTASONE) 50 MG tablet, Take one tablet daily for the next 5 days., Disp: 5 tablet, Rfl: 0   PROAIR HFA 108 (90 Base) MCG/ACT inhaler, Inhale 2 puffs into the lungs every 6 (six) hours as needed., Disp: , Rfl: 0   tiZANidine (ZANAFLEX) 4 MG tablet, Take 1 tablet (4 mg total) by mouth at bedtime., Disp: 30 tablet, Rfl: 0   tiZANidine (ZANAFLEX) 4 MG tablet, Take 1 tablet (4 mg total) by mouth at bedtime., Disp: 30 tablet, Rfl: 0   traZODone (DESYREL) 50 MG tablet, Take 0.5-1 tablets (25-50 mg total) by mouth at bedtime as needed for sleep., Disp: 30 tablet, Rfl: 3   Vitamin D, Ergocalciferol, (DRISDOL) 1.25 MG (50000 UT) CAPS capsule, Take 1 capsule (50,000 Units total) by mouth every 7 (seven) days., Disp: 12 capsule, Rfl: 0   vitamin E 400 UNIT capsule, Take 400 Units by mouth daily., Disp: , Rfl:    Objective:     Vitals:   02/02/23 1328  BP: 132/80  Pulse: 66  SpO2: 99%  Weight: 158 lb (71.7 kg)  Height: 5\' 2"  (1.575 m)      Body mass index is 28.9 kg/m.    Physical Exam:    Gen: Appears well, nad, nontoxic and pleasant Psych: Alert and oriented, appropriate mood and affect Neuro: sensation intact, strength is 5/5 with df/pf/inv/ev, muscle tone wnl Skin: no susupicious lesions  or rashes  Right foot/ankle:  Mild erythema at first MTP.  TTP to first MTP on dorsal, plantar, medial and lateral aspects Mild discomfort with resisted toe flexion and extension NTTP over fibular head, lat mal, medial mal, achilles, navicular, base of 5th, ATFL, CFL, deltoid, calcaneous or midfoot    Electronically signed by:  Benito Mccreedy D.[facility] 2:32 PM 02/02/23

## 2023-02-22 ENCOUNTER — Ambulatory Visit: Payer: Medicare Other | Admitting: Sports Medicine

## 2023-02-22 NOTE — Progress Notes (Deleted)
Aleen Sells [facility] [address] [place] [address] Phone: [phone]   Assessment and Plan:     There are no diagnoses linked to this encounter.  ***   Pertinent previous records reviewed include ***   Follow Up: ***     Subjective:   I, Rhonda [name], am serving as a Neurosurgeon for Doctor Richardean Sale   Chief Complaint: right foot pain    HPI:    02/02/23 Patient is a 67 year old female complaining of right foot pain. Patient states that she woke up with pain 2 days ago, pain when walking, swollen, intermittent pain, pain under the ball of the toe on the great toe, no numbness or tingling, ibu and tylenol for the pain ibu works the best, no real radiating pain, this happened back in February and turned red then a day later the pain was gone   02/22/2023 Patient states    Relevant Historical Information: Hypertension,  Additional pertinent review of systems negative.   Current Outpatient Medications:    ALPRAZolam (XANAX) 0.5 MG tablet, Take 0.5 mg by mouth every 6 (six) hours as needed., Disp: , Rfl: 0   amLODipine (NORVASC) 5 MG tablet, Take 5 mg by mouth daily., Disp: , Rfl: 12   aspirin EC 81 MG tablet, Take 81 mg by mouth daily., Disp: , Rfl:    citalopram (CELEXA) 40 MG tablet, Take 40 mg by mouth daily., Disp: , Rfl: 12   fish oil-omega-3 fatty acids 1000 MG capsule, Take 2 g by mouth daily., Disp: , Rfl:    gabapentin (NEURONTIN) 600 MG tablet, Take 300 mg by mouth 3 (three) times daily. , Disp: , Rfl: 1   Glucosamine 500 MG CAPS, Take 1 capsule by mouth daily., Disp: , Rfl:    hydrochlorothiazide (HYDRODIURIL) 25 MG tablet, Take 1 tablet (25 mg total) by mouth daily., Disp: 90 tablet, Rfl: 3   ibuprofen (ADVIL) 800 MG tablet, Take 1 tablet (800 mg total) by mouth every 8 (eight) hours as needed., Disp: 30 tablet, Rfl: 0   Ibuprofen-Famotidine 800-26.6 MG TABS, Take 1 tablet 3 times daily., Disp: 270 tablet,  Rfl: 3   levETIRAcetam (KEPPRA) 500 MG tablet, Take 1 tablet (500 mg total) by mouth 2 (two) times daily., Disp: 60 tablet, Rfl: 3   losartan (COZAAR) 100 MG tablet, Take 100 mg by mouth daily., Disp: , Rfl: 12   meloxicam (MOBIC) 15 MG tablet, Take 1 tablet (15 mg total) by mouth daily., Disp: 30 tablet, Rfl: 0   metFORMIN (GLUCOPHAGE) 500 MG tablet, Take 500 mg by mouth daily with breakfast., Disp: , Rfl:    metoprolol succinate (TOPROL-XL) 100 MG 24 hr tablet, Take 100 mg by mouth every evening. Take with or immediately following a meal., Disp: , Rfl:    montelukast (SINGULAIR) 10 MG tablet, TAKE 1 TABLET BY MOUTH ONCE DAILY EVERY EVENING, Disp: , Rfl: 3   Multiple Vitamin (MULTIVITAMIN WITH MINERALS) TABS tablet, Take 1 tablet by mouth daily., Disp: , Rfl:    ondansetron (ZOFRAN ODT) 4 MG disintegrating tablet, Take 1 tablet (4 mg total) by mouth every 8 (eight) hours as needed for nausea or vomiting., Disp: 20 tablet, Rfl: 0   oxyCODONE-acetaminophen (PERCOCET/ROXICET) 5-325 MG tablet, Take 1 tablet by mouth every 6 (six) hours as needed. for pain, Disp: , Rfl: 0   predniSONE (DELTASONE) 50 MG tablet, 1 tablet by mouth daily, Disp: 5 tablet, Rfl: 0  predniSONE (DELTASONE) 50 MG tablet, Take one tablet daily for the next 5 days., Disp: 5 tablet, Rfl: 0   predniSONE (DELTASONE) 50 MG tablet, Take one tablet daily for the next 5 days., Disp: 5 tablet, Rfl: 0   PROAIR HFA 108 (90 Base) MCG/ACT inhaler, Inhale 2 puffs into the lungs every 6 (six) hours as needed., Disp: , Rfl: 0   tiZANidine (ZANAFLEX) 4 MG tablet, Take 1 tablet (4 mg total) by mouth at bedtime., Disp: 30 tablet, Rfl: 0   tiZANidine (ZANAFLEX) 4 MG tablet, Take 1 tablet (4 mg total) by mouth at bedtime., Disp: 30 tablet, Rfl: 0   traZODone (DESYREL) 50 MG tablet, Take 0.5-1 tablets (25-50 mg total) by mouth at bedtime as needed for sleep., Disp: 30 tablet, Rfl: 3   Vitamin D, Ergocalciferol, (DRISDOL) 1.25 MG (50000 UT) CAPS  capsule, Take 1 capsule (50,000 Units total) by mouth every 7 (seven) days., Disp: 12 capsule, Rfl: 0   vitamin E 400 UNIT capsule, Take 400 Units by mouth daily., Disp: , Rfl:    Objective:     There were no vitals filed for this visit.    There is no height or weight on file to calculate BMI.    Physical Exam:    ***   Electronically signed by:  Aleen Sells [facility] 7:20 AM 02/22/23

## 2023-03-02 DIAGNOSIS — M9905 Segmental and somatic dysfunction of pelvic region: Secondary | ICD-10-CM | POA: Diagnosis not present

## 2023-03-02 DIAGNOSIS — M9904 Segmental and somatic dysfunction of sacral region: Secondary | ICD-10-CM | POA: Diagnosis not present

## 2023-03-02 DIAGNOSIS — M5136 Other intervertebral disc degeneration, lumbar region: Secondary | ICD-10-CM | POA: Diagnosis not present

## 2023-03-02 DIAGNOSIS — M9903 Segmental and somatic dysfunction of lumbar region: Secondary | ICD-10-CM | POA: Diagnosis not present

## 2023-03-23 DIAGNOSIS — E1169 Type 2 diabetes mellitus with other specified complication: Secondary | ICD-10-CM | POA: Diagnosis not present

## 2023-03-23 DIAGNOSIS — M1990 Unspecified osteoarthritis, unspecified site: Secondary | ICD-10-CM | POA: Diagnosis not present

## 2023-03-23 DIAGNOSIS — I10 Essential (primary) hypertension: Secondary | ICD-10-CM | POA: Diagnosis not present

## 2023-03-23 DIAGNOSIS — E11319 Type 2 diabetes mellitus with unspecified diabetic retinopathy without macular edema: Secondary | ICD-10-CM | POA: Diagnosis not present

## 2023-03-23 DIAGNOSIS — M5136 Other intervertebral disc degeneration, lumbar region: Secondary | ICD-10-CM | POA: Diagnosis not present

## 2023-03-23 DIAGNOSIS — E7800 Pure hypercholesterolemia, unspecified: Secondary | ICD-10-CM | POA: Diagnosis not present

## 2023-03-23 DIAGNOSIS — G500 Trigeminal neuralgia: Secondary | ICD-10-CM | POA: Diagnosis not present

## 2023-03-23 DIAGNOSIS — K219 Gastro-esophageal reflux disease without esophagitis: Secondary | ICD-10-CM | POA: Diagnosis not present

## 2023-03-23 DIAGNOSIS — M10071 Idiopathic gout, right ankle and foot: Secondary | ICD-10-CM | POA: Diagnosis not present

## 2023-03-23 DIAGNOSIS — Z0000 Encounter for general adult medical examination without abnormal findings: Secondary | ICD-10-CM | POA: Diagnosis not present

## 2023-03-31 ENCOUNTER — Other Ambulatory Visit: Payer: Self-pay | Admitting: Internal Medicine

## 2023-03-31 DIAGNOSIS — Z13820 Encounter for screening for osteoporosis: Secondary | ICD-10-CM

## 2023-03-31 DIAGNOSIS — M9903 Segmental and somatic dysfunction of lumbar region: Secondary | ICD-10-CM | POA: Diagnosis not present

## 2023-03-31 DIAGNOSIS — M9905 Segmental and somatic dysfunction of pelvic region: Secondary | ICD-10-CM | POA: Diagnosis not present

## 2023-03-31 DIAGNOSIS — E2839 Other primary ovarian failure: Secondary | ICD-10-CM

## 2023-03-31 DIAGNOSIS — M9904 Segmental and somatic dysfunction of sacral region: Secondary | ICD-10-CM | POA: Diagnosis not present

## 2023-03-31 DIAGNOSIS — M5136 Other intervertebral disc degeneration, lumbar region: Secondary | ICD-10-CM | POA: Diagnosis not present

## 2023-04-12 DIAGNOSIS — M9904 Segmental and somatic dysfunction of sacral region: Secondary | ICD-10-CM | POA: Diagnosis not present

## 2023-04-12 DIAGNOSIS — M9903 Segmental and somatic dysfunction of lumbar region: Secondary | ICD-10-CM | POA: Diagnosis not present

## 2023-04-12 DIAGNOSIS — M9905 Segmental and somatic dysfunction of pelvic region: Secondary | ICD-10-CM | POA: Diagnosis not present

## 2023-04-12 DIAGNOSIS — M5136 Other intervertebral disc degeneration, lumbar region: Secondary | ICD-10-CM | POA: Diagnosis not present

## 2023-04-23 ENCOUNTER — Telehealth: Payer: Self-pay | Admitting: Family Medicine

## 2023-04-23 DIAGNOSIS — M546 Pain in thoracic spine: Secondary | ICD-10-CM | POA: Diagnosis not present

## 2023-04-23 NOTE — Telephone Encounter (Signed)
Pt has an appt scheduled 6/18, we worked her in on a cancellation. [place] requesting Dr. Katrinka Blazing call in pain meds for her back. 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  Last OV with Katrinka Blazing 08/2022. Informed pt a visit was needed for evaluation and no pain meds would be called in.Recommended she seek care somewhere near her current location.

## 2023-04-26 NOTE — Progress Notes (Unsigned)
[name] [address] [place] [address] Phone: [phone] Subjective:    I'm seeing this patient by the request  of:  Pahwani, Rinka R, MD  CC: Acute onset low back pain  [id number]  09/07/2022 Patient's back pain likely some is compensating for the knees. Discussed with patient about icing regimen and home exercises. We once again the medications including the gabapentin I think will be beneficial. Follow-up again in 6 to 8 weeks.   Has responded previously to the viscosupplementation and hopefully we will see proving again. Discussed icing regimen and home exercises, discussed medications including the 300 mg to 600 mg up to 3 times a day and the Zanaflex 4 mg at night when needed. Follow-up with me again in 6 to 8 weeks   Updated 04/27/2023 Rhonda [name] is a 67 y.o. female coming in with complaint of back pain.  Patient was having back pain when she was out of town.  Patient did go to an urgent care and was able to find the note.  Feels that the car ride was one of the worst and woke up with it and slowly worsened over the course of 3 days. Felt like a sharp grabbing pain since this weekend. Has been taking flexeril 3x a day and Toradol.     Past Medical History:  Diagnosis Date   Anxiety    Depression    Encephalitis    Hepatitis    ? B when age 19   HTN (hypertension) 09/28/2013   Hypertension    Migraines    Trigeminal neuralgia    Past Surgical History:  Procedure Laterality Date   BRAIN SURGERY  2013   trigeminal neuralgia   BREAST SURGERY     age 14-benign,   COLONOSCOPY WITH PROPOFOL N/A 08/01/2013   Procedure: COLONOSCOPY WITH PROPOFOL;  Surgeon: Charolett Bumpers, MD;  Location: [facility];  Service: Endoscopy;  Laterality: N/A;   CRANIOTOMY     gamma knife     x1   Social History   Socioeconomic History   Marital status: Married    Spouse name: Not on file   Number of children: Not on file   Years of  education: Not on file   Highest education level: Not on file  Occupational History   Occupation: Nurse  Tobacco Use   Smoking status: Never   Smokeless tobacco: Never  Vaping Use   Vaping Use: Never used  Substance and Sexual Activity   Alcohol use: Yes    Comment: occassionally wine   Drug use: No   Sexual activity: Not on file  Other Topics Concern   Not on file  Social History Narrative   Lives with husband, Brett Canales   Caffeine use: 2 cups coffee per day   Right handed    Social Determinants of Health   Financial Resource Strain: Not on file  Food Insecurity: Not on file  Transportation Needs: Not on file  Physical Activity: Not on file  Stress: Not on file  Social Connections: Not on file   Allergies  Allergen Reactions   Amlodipine Swelling   Lisinopril Cough   Family History  Problem Relation Age of Onset   Arthritis Mother    Hyperlipidemia Mother    Heart disease Mother    Stroke Mother    Hypertension Mother    Diabetes Mother    Arthritis Father    Hyperlipidemia Father    Heart disease Father  Stroke Father    Hypertension Father    Diabetes Father    Cancer Maternal Grandmother    Kidney disease Maternal Grandmother    Mental illness Maternal Grandmother    Hypertension Maternal Grandmother    Heart disease Maternal Grandmother    Cancer Maternal Grandfather    Kidney disease Maternal Grandfather    Mental illness Maternal Grandfather    Hypertension Maternal Grandfather    Heart disease Maternal Grandfather    Cancer Paternal Grandmother    Kidney disease Paternal Grandmother    Mental illness Paternal Grandmother    Hypertension Paternal Grandmother    Heart disease Paternal Grandmother    Cancer Paternal Grandfather    Kidney disease Paternal Grandfather    Mental illness Paternal Grandfather    Hypertension Paternal Grandfather    Heart disease Paternal Grandfather     Current Outpatient Medications (Endocrine & Metabolic):     metFORMIN (GLUCOPHAGE) 500 MG tablet, Take 500 mg by mouth daily with breakfast.   predniSONE (DELTASONE) 50 MG tablet, 1 tablet by mouth daily   predniSONE (DELTASONE) 50 MG tablet, Take one tablet daily for the next 5 days.   predniSONE (DELTASONE) 50 MG tablet, Take one tablet daily for the next 5 days.  Current Outpatient Medications (Cardiovascular):    amLODipine (NORVASC) 5 MG tablet, Take 5 mg by mouth daily.   hydrochlorothiazide (HYDRODIURIL) 25 MG tablet, Take 1 tablet (25 mg total) by mouth daily.   losartan (COZAAR) 100 MG tablet, Take 100 mg by mouth daily.   metoprolol succinate (TOPROL-XL) 100 MG 24 hr tablet, Take 100 mg by mouth every evening. Take with or immediately following a meal.  Current Outpatient Medications (Respiratory):    montelukast (SINGULAIR) 10 MG tablet, TAKE 1 TABLET BY MOUTH ONCE DAILY EVERY EVENING   PROAIR HFA 108 (90 Base) MCG/ACT inhaler, Inhale 2 puffs into the lungs every 6 (six) hours as needed.  Current Outpatient Medications (Analgesics):    aspirin EC 81 MG tablet, Take 81 mg by mouth daily.   ibuprofen (ADVIL) 800 MG tablet, Take 1 tablet (800 mg total) by mouth every 8 (eight) hours as needed.   Ibuprofen-Famotidine 800-26.6 MG TABS, Take 1 tablet 3 times daily.   meloxicam (MOBIC) 15 MG tablet, Take 1 tablet (15 mg total) by mouth daily.   oxyCODONE-acetaminophen (PERCOCET/ROXICET) 5-325 MG tablet, Take 1 tablet by mouth every 6 (six) hours as needed. for pain   Current Outpatient Medications (Other):    ALPRAZolam (XANAX) 0.5 MG tablet, Take 0.5 mg by mouth every 6 (six) hours as needed.   citalopram (CELEXA) 40 MG tablet, Take 40 mg by mouth daily.   fish oil-omega-3 fatty acids 1000 MG capsule, Take 2 g by mouth daily.   gabapentin (NEURONTIN) 600 MG tablet, Take 300 mg by mouth 3 (three) times daily.    Glucosamine 500 MG CAPS, Take 1 capsule by mouth daily.   levETIRAcetam (KEPPRA) 500 MG tablet, Take 1 tablet (500 mg total) by  mouth 2 (two) times daily.   Multiple Vitamin (MULTIVITAMIN WITH MINERALS) TABS tablet, Take 1 tablet by mouth daily.   ondansetron (ZOFRAN ODT) 4 MG disintegrating tablet, Take 1 tablet (4 mg total) by mouth every 8 (eight) hours as needed for nausea or vomiting.   tiZANidine (ZANAFLEX) 4 MG tablet, Take 1 tablet (4 mg total) by mouth at bedtime.   tiZANidine (ZANAFLEX) 4 MG tablet, Take 1 tablet (4 mg total) by mouth at bedtime.   traZODone (DESYREL)  50 MG tablet, Take 0.5-1 tablets (25-50 mg total) by mouth at bedtime as needed for sleep.   Vitamin D, Ergocalciferol, (DRISDOL) 1.25 MG (50000 UT) CAPS capsule, Take 1 capsule (50,000 Units total) by mouth every 7 (seven) days.   vitamin E 400 UNIT capsule, Take 400 Units by mouth daily.   Reviewed prior external information including notes and imaging from  primary care provider As well as notes that were available from care everywhere and other healthcare systems.  Past medical history, social, surgical and family history all reviewed in electronic medical record.  No pertanent information unless stated regarding to the chief complaint.   Review of Systems:  No headache, visual changes, nausea, vomiting, diarrhea, constipation, dizziness, abdominal pain, skin rash, fevers, chills, night sweats, weight loss, swollen lymph nodes, body aches, joint swelling, chest pain, shortness of breath, mood changes. POSITIVE muscle aches  Objective  Blood pressure 130/84, pulse 80, height 5\' 2"  (1.575 m), weight 153 lb (69.4 kg), SpO2 94 %.   General: No apparent distress alert and oriented x3 mood and affect normal, dressed appropriately.  HEENT: Pupils equal, extraocular movements intact  Respiratory: Patient's speak in full sentences and does not appear short of breath  Cardiovascular: No lower extremity edema, non tender, no erythema  Low back exam shows some mild loss of lordosis noted.  Patient does have tightness noted in the parascapular area.   Does have a bogginess that is consistent with a potential muscle injury.  Seems to be more over the rhomboid muscle.  After verbal consent patient was prepped with alcohol swab and with a 25-gauge half inch needle injected into the trigger points in the rhomboid muscle and the latissimus dorsi.  Total of 4 cc of 0.5% Marcaine and 1 cc of Kenalog 40 mg/mL used.  Minimal blood loss.  Band-Aid placed.  Postinjection instructions given    Impression and Recommendations:     Thoracic back pain Patient has had a sore throat previously before but I do think that patient does have more of a muscle injury this time.  Discussed with patient about icing regimen and home exercises, which activities to do and which ones to avoid.  Follow-up again in 6 to 8 weeks otherwise.   The above documentation has been reviewed and is accurate and complete Judi Saa, DO

## 2023-04-27 ENCOUNTER — Ambulatory Visit (INDEPENDENT_AMBULATORY_CARE_PROVIDER_SITE_OTHER): Payer: Medicare Other

## 2023-04-27 ENCOUNTER — Ambulatory Visit (INDEPENDENT_AMBULATORY_CARE_PROVIDER_SITE_OTHER): Payer: Medicare Other | Admitting: Family Medicine

## 2023-04-27 VITALS — BP 130/84 | HR 80 | Ht 62.0 in | Wt 153.0 lb

## 2023-04-27 DIAGNOSIS — G8929 Other chronic pain: Secondary | ICD-10-CM

## 2023-04-27 DIAGNOSIS — M25511 Pain in right shoulder: Secondary | ICD-10-CM

## 2023-04-27 DIAGNOSIS — M546 Pain in thoracic spine: Secondary | ICD-10-CM

## 2023-04-27 DIAGNOSIS — M47814 Spondylosis without myelopathy or radiculopathy, thoracic region: Secondary | ICD-10-CM | POA: Diagnosis not present

## 2023-04-27 DIAGNOSIS — R109 Unspecified abdominal pain: Secondary | ICD-10-CM

## 2023-04-27 NOTE — Assessment & Plan Note (Addendum)
Patient has had a sore throat previously before but I do think that patient does have more of a muscle injury this time.  Discussed with patient about icing regimen and home exercises, which activities to do and which ones to avoid.  Would like to see patient though again in a week.  Did have some trigger points in the area.  Was able to move significantly better afterwards.

## 2023-04-27 NOTE — Patient Instructions (Signed)
Xray today Continue to take flexeril at night Ice the area  See you again next week

## 2023-04-28 NOTE — Progress Notes (Signed)
[name] [address] [place] [address] Phone: [phone] Subjective:   INadine Counts, am serving as a scribe for Dr. Antoine Primas.  I'm seeing this patient by the request  of:  Pahwani, Rinka R, MD  CC: Low back pain follow-up  [id number]  04/27/2023 Patient has had a sore throat previously before but I do think that patient does have more of a muscle injury this time.  Discussed with patient about icing regimen and home exercises, which activities to do and which ones to avoid.  Would like to see patient though again in a week.  Did have some trigger points in the area.  Was able to move significantly better afterwards.      Update 05/04/2023 Rhonda [name] is a 67 y.o. female coming in with complaint of back pain. Patient states back pain is doing much better. No pain in that area. Pop in shoulders when moving in certain way. No pain associated with pop. States that she is feeling about 85% herself at the moment.      Past Medical History:  Diagnosis Date   Anxiety    Depression    Encephalitis    Hepatitis    ? B when age 43   HTN (hypertension) 09/28/2013   Hypertension    Migraines    Trigeminal neuralgia    Past Surgical History:  Procedure Laterality Date   BRAIN SURGERY  2013   trigeminal neuralgia   BREAST SURGERY     age 14-benign,   COLONOSCOPY WITH PROPOFOL N/A 08/01/2013   Procedure: COLONOSCOPY WITH PROPOFOL;  Surgeon: Charolett Bumpers, MD;  Location: [facility];  Service: Endoscopy;  Laterality: N/A;   CRANIOTOMY     gamma knife     x1   Social History   Socioeconomic History   Marital status: Married    Spouse name: Not on file   Number of children: Not on file   Years of education: Not on file   Highest education level: Not on file  Occupational History   Occupation: Nurse  Tobacco Use   Smoking status: Never   Smokeless tobacco: Never  Vaping Use   Vaping Use: Never used  Substance and  Sexual Activity   Alcohol use: Yes    Comment: occassionally wine   Drug use: No   Sexual activity: Not on file  Other Topics Concern   Not on file  Social History Narrative   Lives with husband, Brett Canales   Caffeine use: 2 cups coffee per day   Right handed    Social Determinants of Health   Financial Resource Strain: Not on file  Food Insecurity: Not on file  Transportation Needs: Not on file  Physical Activity: Not on file  Stress: Not on file  Social Connections: Not on file   Allergies  Allergen Reactions   Amlodipine Swelling   Lisinopril Cough   Family History  Problem Relation Age of Onset   Arthritis Mother    Hyperlipidemia Mother    Heart disease Mother    Stroke Mother    Hypertension Mother    Diabetes Mother    Arthritis Father    Hyperlipidemia Father    Heart disease Father    Stroke Father    Hypertension Father    Diabetes Father    Cancer Maternal Grandmother    Kidney disease Maternal Grandmother    Mental illness Maternal Grandmother    Hypertension Maternal Grandmother  Heart disease Maternal Grandmother    Cancer Maternal Grandfather    Kidney disease Maternal Grandfather    Mental illness Maternal Grandfather    Hypertension Maternal Grandfather    Heart disease Maternal Grandfather    Cancer Paternal Grandmother    Kidney disease Paternal Grandmother    Mental illness Paternal Grandmother    Hypertension Paternal Grandmother    Heart disease Paternal Grandmother    Cancer Paternal Grandfather    Kidney disease Paternal Grandfather    Mental illness Paternal Grandfather    Hypertension Paternal Grandfather    Heart disease Paternal Grandfather     Current Outpatient Medications (Endocrine & Metabolic):    metFORMIN (GLUCOPHAGE) 500 MG tablet, Take 500 mg by mouth daily with breakfast.   predniSONE (DELTASONE) 50 MG tablet, 1 tablet by mouth daily   predniSONE (DELTASONE) 50 MG tablet, Take one tablet daily for the next 5 days.    predniSONE (DELTASONE) 50 MG tablet, Take one tablet daily for the next 5 days.  Current Outpatient Medications (Cardiovascular):    amLODipine (NORVASC) 5 MG tablet, Take 5 mg by mouth daily.   hydrochlorothiazide (HYDRODIURIL) 25 MG tablet, Take 1 tablet (25 mg total) by mouth daily.   losartan (COZAAR) 100 MG tablet, Take 100 mg by mouth daily.   metoprolol succinate (TOPROL-XL) 100 MG 24 hr tablet, Take 100 mg by mouth every evening. Take with or immediately following a meal.  Current Outpatient Medications (Respiratory):    montelukast (SINGULAIR) 10 MG tablet, TAKE 1 TABLET BY MOUTH ONCE DAILY EVERY EVENING   PROAIR HFA 108 (90 Base) MCG/ACT inhaler, Inhale 2 puffs into the lungs every 6 (six) hours as needed.  Current Outpatient Medications (Analgesics):    aspirin EC 81 MG tablet, Take 81 mg by mouth daily.   ibuprofen (ADVIL) 800 MG tablet, Take 1 tablet (800 mg total) by mouth every 8 (eight) hours as needed.   Ibuprofen-Famotidine 800-26.6 MG TABS, Take 1 tablet 3 times daily.   meloxicam (MOBIC) 15 MG tablet, Take 1 tablet (15 mg total) by mouth daily.   oxyCODONE-acetaminophen (PERCOCET/ROXICET) 5-325 MG tablet, Take 1 tablet by mouth every 6 (six) hours as needed. for pain   Current Outpatient Medications (Other):    ALPRAZolam (XANAX) 0.5 MG tablet, Take 0.5 mg by mouth every 6 (six) hours as needed.   citalopram (CELEXA) 40 MG tablet, Take 40 mg by mouth daily.   fish oil-omega-3 fatty acids 1000 MG capsule, Take 2 g by mouth daily.   gabapentin (NEURONTIN) 600 MG tablet, Take 300 mg by mouth 3 (three) times daily.    Glucosamine 500 MG CAPS, Take 1 capsule by mouth daily.   levETIRAcetam (KEPPRA) 500 MG tablet, Take 1 tablet (500 mg total) by mouth 2 (two) times daily.   Multiple Vitamin (MULTIVITAMIN WITH MINERALS) TABS tablet, Take 1 tablet by mouth daily.   ondansetron (ZOFRAN ODT) 4 MG disintegrating tablet, Take 1 tablet (4 mg total) by mouth every 8 (eight) hours as  needed for nausea or vomiting.   tiZANidine (ZANAFLEX) 4 MG tablet, Take 1 tablet (4 mg total) by mouth at bedtime.   tiZANidine (ZANAFLEX) 4 MG tablet, Take 1 tablet (4 mg total) by mouth at bedtime.   traZODone (DESYREL) 50 MG tablet, Take 0.5-1 tablets (25-50 mg total) by mouth at bedtime as needed for sleep.   Vitamin D, Ergocalciferol, (DRISDOL) 1.25 MG (50000 UT) CAPS capsule, Take 1 capsule (50,000 Units total) by mouth every 7 (seven) days.  vitamin E 400 UNIT capsule, Take 400 Units by mouth daily.   Objective  Blood pressure (!) 128/90, pulse 72, height 5\' 2"  (1.575 m), SpO2 98 %.   General: No apparent distress alert and oriented x3 mood and affect normal, dressed appropriately.  HEENT: Pupils equal, extraocular movements intact  Respiratory: Patient's speak in full sentences and does not appear short of breath  Cardiovascular: No lower extremity edema, non tender, no erythema  Low back does have only very mild loss of lordosis.  Neck exam does have improvements in range of motion as well.  Does not seem to be uncomfortable at all    Impression and Recommendations:     The above documentation has been reviewed and is accurate and complete Judi Saa, DO

## 2023-05-04 ENCOUNTER — Ambulatory Visit: Payer: Medicare Other | Admitting: Family Medicine

## 2023-05-04 VITALS — BP 128/90 | HR 72 | Ht 62.0 in

## 2023-05-04 DIAGNOSIS — M546 Pain in thoracic spine: Secondary | ICD-10-CM

## 2023-05-04 NOTE — Assessment & Plan Note (Signed)
Trigger points are completely resolved at this point.  No significant discomfort at this moment.  Seems to be back near her baseline.  Follow-up as needed

## 2023-05-26 DIAGNOSIS — I10 Essential (primary) hypertension: Secondary | ICD-10-CM | POA: Diagnosis not present

## 2023-05-26 DIAGNOSIS — E119 Type 2 diabetes mellitus without complications: Secondary | ICD-10-CM | POA: Diagnosis not present

## 2023-05-26 DIAGNOSIS — E7800 Pure hypercholesterolemia, unspecified: Secondary | ICD-10-CM | POA: Diagnosis not present

## 2023-05-26 DIAGNOSIS — Z1211 Encounter for screening for malignant neoplasm of colon: Secondary | ICD-10-CM | POA: Diagnosis not present

## 2023-09-08 NOTE — Progress Notes (Unsigned)
[name] [address] [place] [address] Phone: [phone] Subjective:   Bruce Donath, am serving as a scribe for Dr. Antoine Primas.  I'm seeing this patient by the request  of:  Pahwani, Rinka R, MD  CC: Right shoulder pain, knee pain  [id number]  05/04/2023 Trigger points are completely resolved at this point. No significant discomfort at this moment. Seems to be back near her baseline. Follow-up as needed   Updated 09/09/2023 Rhonda [name] is a 67 y.o. female coming in with complaint of knee and R shoulder pain. Pain in shoulder with flexion. Pain throughout the joint for past 2 months. Denies any radiating symptoms.   B knee pain. Knees get tired and achy. Pain worse at work. Visco injections have been helpful.        Past Medical History:  Diagnosis Date   Anxiety    Depression    Encephalitis    Hepatitis    ? B when age 89   HTN (hypertension) 09/28/2013   Hypertension    Migraines    Trigeminal neuralgia    Past Surgical History:  Procedure Laterality Date   BRAIN SURGERY  2013   trigeminal neuralgia   BREAST SURGERY     age 1-benign,   COLONOSCOPY WITH PROPOFOL N/A 08/01/2013   Procedure: COLONOSCOPY WITH PROPOFOL;  Surgeon: Charolett Bumpers, MD;  Location: [facility];  Service: Endoscopy;  Laterality: N/A;   CRANIOTOMY     gamma knife     x1   Social History   Socioeconomic History   Marital status: Married    Spouse name: Not on file   Number of children: Not on file   Years of education: Not on file   Highest education level: Not on file  Occupational History   Occupation: Nurse  Tobacco Use   Smoking status: Never   Smokeless tobacco: Never  Vaping Use   Vaping status: Never Used  Substance and Sexual Activity   Alcohol use: Yes    Comment: occassionally wine   Drug use: No   Sexual activity: Not on file  Other Topics Concern   Not on file  Social History Narrative   Lives with  husband, Brett Canales   Caffeine use: 2 cups coffee per day   Right handed    Social Determinants of Health   Financial Resource Strain: Not on file  Food Insecurity: Not on file  Transportation Needs: Not on file  Physical Activity: Not on file  Stress: Not on file  Social Connections: Not on file   Allergies  Allergen Reactions   Amlodipine Swelling   Lisinopril Cough   Family History  Problem Relation Age of Onset   Arthritis Mother    Hyperlipidemia Mother    Heart disease Mother    Stroke Mother    Hypertension Mother    Diabetes Mother    Arthritis Father    Hyperlipidemia Father    Heart disease Father    Stroke Father    Hypertension Father    Diabetes Father    Cancer Maternal Grandmother    Kidney disease Maternal Grandmother    Mental illness Maternal Grandmother    Hypertension Maternal Grandmother    Heart disease Maternal Grandmother    Cancer Maternal Grandfather    Kidney disease Maternal Grandfather    Mental illness Maternal Grandfather    Hypertension Maternal Grandfather    Heart disease Maternal Grandfather    Cancer Paternal Grandmother  Kidney disease Paternal Grandmother    Mental illness Paternal Grandmother    Hypertension Paternal Grandmother    Heart disease Paternal Grandmother    Cancer Paternal Grandfather    Kidney disease Paternal Grandfather    Mental illness Paternal Grandfather    Hypertension Paternal Grandfather    Heart disease Paternal Grandfather     Current Outpatient Medications (Endocrine & Metabolic):    metFORMIN (GLUCOPHAGE) 500 MG tablet, Take 500 mg by mouth daily with breakfast.   predniSONE (DELTASONE) 50 MG tablet, 1 tablet by mouth daily   predniSONE (DELTASONE) 50 MG tablet, Take one tablet daily for the next 5 days.   predniSONE (DELTASONE) 50 MG tablet, Take one tablet daily for the next 5 days.  Current Outpatient Medications (Cardiovascular):    amLODipine (NORVASC) 5 MG tablet, Take 5 mg by mouth  daily.   hydrochlorothiazide (HYDRODIURIL) 25 MG tablet, Take 1 tablet (25 mg total) by mouth daily.   losartan (COZAAR) 100 MG tablet, Take 100 mg by mouth daily.   metoprolol succinate (TOPROL-XL) 100 MG 24 hr tablet, Take 100 mg by mouth every evening. Take with or immediately following a meal.  Current Outpatient Medications (Respiratory):    montelukast (SINGULAIR) 10 MG tablet, TAKE 1 TABLET BY MOUTH ONCE DAILY EVERY EVENING   PROAIR HFA 108 (90 Base) MCG/ACT inhaler, Inhale 2 puffs into the lungs every 6 (six) hours as needed.  Current Outpatient Medications (Analgesics):    aspirin EC 81 MG tablet, Take 81 mg by mouth daily.   ibuprofen (ADVIL) 800 MG tablet, Take 1 tablet (800 mg total) by mouth every 8 (eight) hours as needed.   Ibuprofen-Famotidine 800-26.6 MG TABS, Take 1 tablet 3 times daily.   meloxicam (MOBIC) 15 MG tablet, Take 1 tablet (15 mg total) by mouth daily.   oxyCODONE-acetaminophen (PERCOCET/ROXICET) 5-325 MG tablet, Take 1 tablet by mouth every 6 (six) hours as needed. for pain   Current Outpatient Medications (Other):    ALPRAZolam (XANAX) 0.5 MG tablet, Take 0.5 mg by mouth every 6 (six) hours as needed.   citalopram (CELEXA) 40 MG tablet, Take 40 mg by mouth daily.   fish oil-omega-3 fatty acids 1000 MG capsule, Take 2 g by mouth daily.   gabapentin (NEURONTIN) 600 MG tablet, Take 300 mg by mouth 3 (three) times daily.    Glucosamine 500 MG CAPS, Take 1 capsule by mouth daily.   levETIRAcetam (KEPPRA) 500 MG tablet, Take 1 tablet (500 mg total) by mouth 2 (two) times daily.   Multiple Vitamin (MULTIVITAMIN WITH MINERALS) TABS tablet, Take 1 tablet by mouth daily.   ondansetron (ZOFRAN ODT) 4 MG disintegrating tablet, Take 1 tablet (4 mg total) by mouth every 8 (eight) hours as needed for nausea or vomiting.   tiZANidine (ZANAFLEX) 4 MG tablet, Take 1 tablet (4 mg total) by mouth at bedtime.   tiZANidine (ZANAFLEX) 4 MG tablet, Take 1 tablet (4 mg total) by  mouth at bedtime.   traZODone (DESYREL) 50 MG tablet, Take 0.5-1 tablets (25-50 mg total) by mouth at bedtime as needed for sleep.   Vitamin D, Ergocalciferol, (DRISDOL) 1.25 MG (50000 UT) CAPS capsule, Take 1 capsule (50,000 Units total) by mouth every 7 (seven) days.   vitamin E 400 UNIT capsule, Take 400 Units by mouth daily.   Reviewed prior external information including notes and imaging from  primary care provider As well as notes that were available from care everywhere and other healthcare systems.  Past medical history,  social, surgical and family history all reviewed in electronic medical record.  No pertanent information unless stated regarding to the chief complaint.   Review of Systems:  No headache, visual changes, nausea, vomiting, diarrhea, constipation, dizziness, abdominal pain, skin rash, fevers, chills, night sweats, weight loss, swollen lymph nodes, body aches, joint swelling, chest pain, shortness of breath, mood changes. POSITIVE muscle aches  Objective  Blood pressure 126/82, pulse 72, height 5\' 2"  (1.575 m), weight 152 lb (68.9 kg), SpO2 99%.   General: No apparent distress alert and oriented x3 mood and affect normal, dressed appropriately.  HEENT: Pupils equal, extraocular movements intact  Respiratory: Patient's speak in full sentences and does not appear short of breath  Cardiovascular: No lower extremity edema, non tender, no erythema  Patient's knees do have some arthritic changes noted.  Some crepitus noted.  Trace effusion of the patellofemoral joint bilaterally.     After informed written and verbal consent, patient was seated on exam table. Right knee was prepped with alcohol swab and utilizing anterolateral approach, patient's right knee space was injected with 60 mg per 3 mL of Durolane  (sodium hyaluronate) in a prefilled syringe was injected easily into the knee through a 22-gauge needle..Patient tolerated the procedure well without immediate  complications.  After informed written and verbal consent, patient was seated on exam table. Left knee was prepped with alcohol swab and utilizing anterolateral approach, patient's left knee space was injected with 60 mg per 3 mL of Durolane (sodium hyaluronate) in a prefilled syringe was injected easily into the knee through a 22-gauge needle..Patient tolerated the procedure well without immediate complications.  Procedure: Real-time Ultrasound Guided Injection of right ac joint  Device: GE Logiq Q7 Ultrasound guided injection is preferred based studies that show increased duration, increased effect, greater accuracy, decreased procedural pain, increased response rate, and decreased cost with ultrasound guided versus blind injection.  Verbal informed consent obtained.  Time-out conducted.  Noted no overlying erythema, induration, or other signs of local infection.  Skin prepped in a sterile fashion.  Local anesthesia: Topical Ethyl chloride.  With sterile technique and under real time ultrasound guidance: With a 25-gauge half inch needle injected with 0.5 cc of 0.5% Marcaine and 0.5 cc of Kenalog 40 mg/mL Completed without difficulty  Pain immediately resolved suggesting accurate placement of the medication.  Advised to call if fevers/chills, erythema, induration, drainage, or persistent bleeding.  Impression: Technically successful ultrasound guided injection.    Impression and Recommendations:     The above documentation has been reviewed and is accurate and complete Judi Saa, DO

## 2023-09-09 ENCOUNTER — Encounter: Payer: Self-pay | Admitting: Family Medicine

## 2023-09-09 ENCOUNTER — Other Ambulatory Visit: Payer: Self-pay

## 2023-09-09 ENCOUNTER — Ambulatory Visit: Payer: Medicare Other | Admitting: Family Medicine

## 2023-09-09 VITALS — BP 126/82 | HR 72 | Ht 62.0 in | Wt 152.0 lb

## 2023-09-09 DIAGNOSIS — M25511 Pain in right shoulder: Secondary | ICD-10-CM | POA: Diagnosis not present

## 2023-09-09 DIAGNOSIS — M170 Bilateral primary osteoarthritis of knee: Secondary | ICD-10-CM

## 2023-09-09 DIAGNOSIS — M19011 Primary osteoarthritis, right shoulder: Secondary | ICD-10-CM | POA: Diagnosis not present

## 2023-09-09 MED ORDER — SODIUM HYALURONATE 60 MG/3ML IX PRSY
120.0000 mg | PREFILLED_SYRINGE | Freq: Once | INTRA_ARTICULAR | Status: AC
  Administered 2023-09-09: 120 mg via INTRA_ARTICULAR

## 2023-09-09 NOTE — Assessment & Plan Note (Signed)
Injection given today, tolerated procedure well, discussed icing regimen and home exercises.  Increase activity slowly.  Follow-up again in 6 to 8 weeks.

## 2023-09-09 NOTE — Patient Instructions (Addendum)
AC jt injection Durolane injections for both knees Exercises [facility] See me again in 2 months just in case

## 2023-09-09 NOTE — Assessment & Plan Note (Signed)
Chronic problem with worsening symptoms, patient has done relatively well though for the last 5 years.  Slowly progression.  Does have some extension or progression noted.  Has been doing a lot of activities though with lingering shoulder.  Open she will continue to stay active.  Follow-up with me again in 6 to 8 weeks otherwise.

## 2023-10-29 NOTE — Progress Notes (Deleted)
[name] [address] [place] [address] Phone: [phone] Subjective:    I'm seeing this patient by the request  of:  Pahwani, Kasandra Knudsen, MD  CC:   [id number]  09/09/2023 Chronic problem with worsening symptoms, patient has done relatively well though for the last 5 years.  Slowly progression.  Does have some extension or progression noted.  Has been doing a lot of activities though with lingering shoulder.  Open she will continue to stay active.  Follow-up with me again in 6 to 8 weeks otherwise.     Updated 11/05/2023 Rhonda [name] is a 67 y.o. female coming in with complaint of B knee pain and shoulder pain       Past Medical History:  Diagnosis Date   Anxiety    Depression    Encephalitis    Hepatitis    ? B when age 3   HTN (hypertension) 09/28/2013   Hypertension    Migraines    Trigeminal neuralgia    Past Surgical History:  Procedure Laterality Date   BRAIN SURGERY  2013   trigeminal neuralgia   BREAST SURGERY     age 53-benign,   COLONOSCOPY WITH PROPOFOL N/A 08/01/2013   Procedure: COLONOSCOPY WITH PROPOFOL;  Surgeon: Charolett Bumpers, MD;  Location: [facility];  Service: Endoscopy;  Laterality: N/A;   CRANIOTOMY     gamma knife     x1   Social History   Socioeconomic History   Marital status: Married    Spouse name: Not on file   Number of children: Not on file   Years of education: Not on file   Highest education level: Not on file  Occupational History   Occupation: Nurse  Tobacco Use   Smoking status: Never   Smokeless tobacco: Never  Vaping Use   Vaping status: Never Used  Substance and Sexual Activity   Alcohol use: Yes    Comment: occassionally wine   Drug use: No   Sexual activity: Not on file  Other Topics Concern   Not on file  Social History Narrative   Lives with husband, Brett Canales   Caffeine use: 2 cups coffee per day   Right handed    Social Drivers of Research scientist (physical sciences) Strain: Not on file  Food Insecurity: Not on file  Transportation Needs: Not on file  Physical Activity: Not on file  Stress: Not on file  Social Connections: Not on file   Allergies  Allergen Reactions   Amlodipine Swelling   Lisinopril Cough   Family History  Problem Relation Age of Onset   Arthritis Mother    Hyperlipidemia Mother    Heart disease Mother    Stroke Mother    Hypertension Mother    Diabetes Mother    Arthritis Father    Hyperlipidemia Father    Heart disease Father    Stroke Father    Hypertension Father    Diabetes Father    Cancer Maternal Grandmother    Kidney disease Maternal Grandmother    Mental illness Maternal Grandmother    Hypertension Maternal Grandmother    Heart disease Maternal Grandmother    Cancer Maternal Grandfather    Kidney disease Maternal Grandfather    Mental illness Maternal Grandfather    Hypertension Maternal Grandfather    Heart disease Maternal Grandfather    Cancer Paternal Grandmother    Kidney disease Paternal Grandmother    Mental illness Paternal Grandmother  Hypertension Paternal Grandmother    Heart disease Paternal Grandmother    Cancer Paternal Grandfather    Kidney disease Paternal Grandfather    Mental illness Paternal Grandfather    Hypertension Paternal Grandfather    Heart disease Paternal Grandfather     Current Outpatient Medications (Endocrine & Metabolic):    metFORMIN (GLUCOPHAGE) 500 MG tablet, Take 500 mg by mouth daily with breakfast.   predniSONE (DELTASONE) 50 MG tablet, 1 tablet by mouth daily   predniSONE (DELTASONE) 50 MG tablet, Take one tablet daily for the next 5 days.   predniSONE (DELTASONE) 50 MG tablet, Take one tablet daily for the next 5 days.  Current Outpatient Medications (Cardiovascular):    amLODipine (NORVASC) 5 MG tablet, Take 5 mg by mouth daily.   hydrochlorothiazide (HYDRODIURIL) 25 MG tablet, Take 1 tablet (25 mg total) by mouth daily.   losartan (COZAAR)  100 MG tablet, Take 100 mg by mouth daily.   metoprolol succinate (TOPROL-XL) 100 MG 24 hr tablet, Take 100 mg by mouth every evening. Take with or immediately following a meal.  Current Outpatient Medications (Respiratory):    montelukast (SINGULAIR) 10 MG tablet, TAKE 1 TABLET BY MOUTH ONCE DAILY EVERY EVENING   PROAIR HFA 108 (90 Base) MCG/ACT inhaler, Inhale 2 puffs into the lungs every 6 (six) hours as needed.  Current Outpatient Medications (Analgesics):    aspirin EC 81 MG tablet, Take 81 mg by mouth daily.   ibuprofen (ADVIL) 800 MG tablet, Take 1 tablet (800 mg total) by mouth every 8 (eight) hours as needed.   Ibuprofen-Famotidine 800-26.6 MG TABS, Take 1 tablet 3 times daily.   meloxicam (MOBIC) 15 MG tablet, Take 1 tablet (15 mg total) by mouth daily.   oxyCODONE-acetaminophen (PERCOCET/ROXICET) 5-325 MG tablet, Take 1 tablet by mouth every 6 (six) hours as needed. for pain   Current Outpatient Medications (Other):    ALPRAZolam (XANAX) 0.5 MG tablet, Take 0.5 mg by mouth every 6 (six) hours as needed.   citalopram (CELEXA) 40 MG tablet, Take 40 mg by mouth daily.   fish oil-omega-3 fatty acids 1000 MG capsule, Take 2 g by mouth daily.   gabapentin (NEURONTIN) 600 MG tablet, Take 300 mg by mouth 3 (three) times daily.    Glucosamine 500 MG CAPS, Take 1 capsule by mouth daily.   levETIRAcetam (KEPPRA) 500 MG tablet, Take 1 tablet (500 mg total) by mouth 2 (two) times daily.   Multiple Vitamin (MULTIVITAMIN WITH MINERALS) TABS tablet, Take 1 tablet by mouth daily.   ondansetron (ZOFRAN ODT) 4 MG disintegrating tablet, Take 1 tablet (4 mg total) by mouth every 8 (eight) hours as needed for nausea or vomiting.   tiZANidine (ZANAFLEX) 4 MG tablet, Take 1 tablet (4 mg total) by mouth at bedtime.   tiZANidine (ZANAFLEX) 4 MG tablet, Take 1 tablet (4 mg total) by mouth at bedtime.   traZODone (DESYREL) 50 MG tablet, Take 0.5-1 tablets (25-50 mg total) by mouth at bedtime as needed for  sleep.   Vitamin D, Ergocalciferol, (DRISDOL) 1.25 MG (50000 UT) CAPS capsule, Take 1 capsule (50,000 Units total) by mouth every 7 (seven) days.   vitamin E 400 UNIT capsule, Take 400 Units by mouth daily.   Reviewed prior external information including notes and imaging from  primary care provider As well as notes that were available from care everywhere and other healthcare systems.  Past medical history, social, surgical and family history all reviewed in electronic medical record.  No pertanent  information unless stated regarding to the chief complaint.   Review of Systems:  No headache, visual changes, nausea, vomiting, diarrhea, constipation, dizziness, abdominal pain, skin rash, fevers, chills, night sweats, weight loss, swollen lymph nodes, body aches, joint swelling, chest pain, shortness of breath, mood changes. POSITIVE muscle aches  Objective  There were no vitals taken for this visit.   General: No apparent distress alert and oriented x3 mood and affect normal, dressed appropriately.  HEENT: Pupils equal, extraocular movements intact  Respiratory: Patient's speak in full sentences and does not appear short of breath  Cardiovascular: No lower extremity edema, non tender, no erythema      Impression and Recommendations:

## 2023-11-05 ENCOUNTER — Ambulatory Visit: Payer: Medicare Other | Admitting: Family Medicine

## 2023-12-07 ENCOUNTER — Other Ambulatory Visit: Payer: Medicare Other

## 2023-12-27 ENCOUNTER — Emergency Department (HOSPITAL_COMMUNITY): Payer: Medicare Other

## 2023-12-27 ENCOUNTER — Observation Stay (HOSPITAL_COMMUNITY)
Admission: EM | Admit: 2023-12-27 | Discharge: 2023-12-27 | Disposition: A | Discharge status: Against Medical Advice | Payer: Medicare Other | Attending: Emergency Medicine | Admitting: Emergency Medicine

## 2023-12-27 ENCOUNTER — Other Ambulatory Visit: Payer: Self-pay

## 2023-12-27 ENCOUNTER — Encounter (HOSPITAL_COMMUNITY): Payer: Self-pay

## 2023-12-27 DIAGNOSIS — L03211 Cellulitis of face: Secondary | ICD-10-CM

## 2023-12-27 DIAGNOSIS — G500 Trigeminal neuralgia: Secondary | ICD-10-CM | POA: Diagnosis present

## 2023-12-27 DIAGNOSIS — I10 Essential (primary) hypertension: Secondary | ICD-10-CM | POA: Diagnosis not present

## 2023-12-27 DIAGNOSIS — R531 Weakness: Secondary | ICD-10-CM | POA: Insufficient documentation

## 2023-12-27 DIAGNOSIS — A690 Necrotizing ulcerative stomatitis: Secondary | ICD-10-CM | POA: Diagnosis not present

## 2023-12-27 DIAGNOSIS — Z7984 Long term (current) use of oral hypoglycemic drugs: Secondary | ICD-10-CM | POA: Diagnosis not present

## 2023-12-27 DIAGNOSIS — K140 Glossitis: Secondary | ICD-10-CM | POA: Diagnosis not present

## 2023-12-27 DIAGNOSIS — Z7982 Long term (current) use of aspirin: Secondary | ICD-10-CM | POA: Insufficient documentation

## 2023-12-27 DIAGNOSIS — E119 Type 2 diabetes mellitus without complications: Secondary | ICD-10-CM | POA: Diagnosis not present

## 2023-12-27 DIAGNOSIS — B49 Unspecified mycosis: Secondary | ICD-10-CM | POA: Diagnosis present

## 2023-12-27 DIAGNOSIS — R1310 Dysphagia, unspecified: Secondary | ICD-10-CM | POA: Insufficient documentation

## 2023-12-27 DIAGNOSIS — F32A Depression, unspecified: Secondary | ICD-10-CM | POA: Diagnosis present

## 2023-12-27 DIAGNOSIS — Z79899 Other long term (current) drug therapy: Secondary | ICD-10-CM | POA: Diagnosis not present

## 2023-12-27 DIAGNOSIS — F419 Anxiety disorder, unspecified: Secondary | ICD-10-CM | POA: Diagnosis present

## 2023-12-27 LAB — CBC WITH DIFFERENTIAL/PLATELET
Abs Immature Granulocytes: 0.19 10*3/uL — ABNORMAL HIGH (ref 0.00–0.07)
Basophils Absolute: 0 10*3/uL (ref 0.0–0.1)
Basophils Relative: 0 %
Eosinophils Absolute: 0 10*3/uL (ref 0.0–0.5)
Eosinophils Relative: 0 %
HCT: 38 % (ref 36.0–46.0)
Hemoglobin: 12.6 g/dL (ref 12.0–15.0)
Immature Granulocytes: 1 %
Lymphocytes Relative: 5 %
Lymphs Abs: 0.8 10*3/uL (ref 0.7–4.0)
MCH: 31.3 pg (ref 26.0–34.0)
MCHC: 33.2 g/dL (ref 30.0–36.0)
MCV: 94.3 fL (ref 80.0–100.0)
Monocytes Absolute: 0.6 10*3/uL (ref 0.1–1.0)
Monocytes Relative: 3 %
Neutro Abs: 14.5 10*3/uL — ABNORMAL HIGH (ref 1.7–7.7)
Neutrophils Relative %: 91 %
Platelets: 350 10*3/uL (ref 150–400)
RBC: 4.03 MIL/uL (ref 3.87–5.11)
RDW: 13.2 % (ref 11.5–15.5)
WBC: 16.2 10*3/uL — ABNORMAL HIGH (ref 4.0–10.5)
nRBC: 0 % (ref 0.0–0.2)

## 2023-12-27 LAB — COMPREHENSIVE METABOLIC PANEL
ALT: 39 U/L (ref 0–44)
AST: 26 U/L (ref 15–41)
Albumin: 2.5 g/dL — ABNORMAL LOW (ref 3.5–5.0)
Alkaline Phosphatase: 52 U/L (ref 38–126)
Anion gap: 8 (ref 5–15)
BUN: 28 mg/dL — ABNORMAL HIGH (ref 8–23)
CO2: 21 mmol/L — ABNORMAL LOW (ref 22–32)
Calcium: 9 mg/dL (ref 8.9–10.3)
Chloride: 106 mmol/L (ref 98–111)
Creatinine, Ser: 0.86 mg/dL (ref 0.44–1.00)
GFR, Estimated: >60 mL/min (ref >60)
Glucose, Bld: 195 mg/dL — ABNORMAL HIGH (ref 70–99)
Potassium: 4.4 mmol/L (ref 3.5–5.1)
Sodium: 135 mmol/L (ref 135–145)
Total Bilirubin: 0.7 mg/dL (ref 0.0–1.2)
Total Protein: 6.5 g/dL (ref 6.5–8.1)

## 2023-12-27 LAB — PROTIME-INR
INR: 1 (ref 0.8–1.2)
Prothrombin Time: 13.8 s (ref 11.4–15.2)

## 2023-12-27 LAB — I-STAT CG4 LACTIC ACID, ED
Lactic Acid, Venous: 1.3 mmol/L (ref 0.5–1.9)
Lactic Acid, Venous: 1.3 mmol/L (ref 0.5–1.9)

## 2023-12-27 MED ORDER — SODIUM CHLORIDE 0.9% FLUSH: 3.0000 mL | Freq: Two times a day (BID) | INTRAVENOUS | Status: DC

## 2023-12-27 MED ORDER — ACETAMINOPHEN 325 MG PO TABS: 650.0000 mg | ORAL_TABLET | Freq: Four times a day (QID) | ORAL | Status: DC | PRN

## 2023-12-27 MED ORDER — ALBUTEROL SULFATE HFA 108 (90 BASE) MCG/ACT IN AERS: 2.0000 | INHALATION_SPRAY | Freq: Four times a day (QID) | RESPIRATORY_TRACT | Status: DC | PRN

## 2023-12-27 MED ORDER — METOPROLOL SUCCINATE ER 25 MG PO TB24
100.0000 mg | ORAL_TABLET | Freq: Every evening | ORAL | Status: DC
Start: 2023-12-27 — End: 2023-12-27

## 2023-12-27 MED ORDER — ACETAMINOPHEN 650 MG RE SUPP: 650.0000 mg | Freq: Four times a day (QID) | RECTAL | Status: DC | PRN

## 2023-12-27 MED ORDER — CITALOPRAM HYDROBROMIDE 10 MG PO TABS
40.0000 mg | ORAL_TABLET | Freq: Every day | ORAL | Status: DC
Start: 2023-12-28 — End: 2023-12-27

## 2023-12-27 MED ORDER — HYDROCHLOROTHIAZIDE 25 MG PO TABS: 25.0000 mg | ORAL_TABLET | Freq: Every day | ORAL | Status: DC

## 2023-12-27 MED ORDER — DEXAMETHASONE 1 MG PO TABS: 1.0000 mg | ORAL_TABLET | Freq: Every day | ORAL | 0 refills | Status: AC

## 2023-12-27 MED ORDER — FLUCONAZOLE IN SODIUM CHLORIDE 200-0.9 MG/100ML-% IV SOLN
200.0000 mg | INTRAVENOUS | Status: DC
  Administered 2023-12-27: 200 mg via INTRAVENOUS
  Filled 2023-12-27: qty 100

## 2023-12-27 MED ORDER — IRBESARTAN 300 MG PO TABS: 300.0000 mg | ORAL_TABLET | Freq: Every day | ORAL | Status: DC

## 2023-12-27 MED ORDER — FLUCONAZOLE 200 MG PO TABS: 200.0000 mg | ORAL_TABLET | Freq: Every day | ORAL | 0 refills | Status: AC

## 2023-12-27 MED ORDER — POLYETHYLENE GLYCOL 3350 17 G PO PACK: 17.0000 g | PACK | Freq: Every day | ORAL | Status: DC | PRN

## 2023-12-27 MED ORDER — MONTELUKAST SODIUM 10 MG PO TABS: 10.0000 mg | ORAL_TABLET | Freq: Every day | ORAL | Status: DC

## 2023-12-27 MED ORDER — ENOXAPARIN SODIUM 40 MG/0.4ML IJ SOSY: 40.0000 mg | PREFILLED_SYRINGE | INTRAMUSCULAR | Status: DC

## 2023-12-27 NOTE — ED Provider Notes (Signed)
 [place] EMERGENCY DEPARTMENT AT [facility] Provider Note   CSN: [id number] Arrival date & time: 12/27/23  1209     History  No chief complaint on file.   Rhonda [name] is a 68 y.o. female.  68 year old female presents today for concern of fungal infection to her tongue.  She was initially seen and treated in [place] however was discharged pending pathology results.  She received a call today to return to the emergency department for treatment.  She states otherwise she has been feeling well.  Has had some p.o. intake which is improving from previous.  Energy levels are improving.  No fevers.  The history is provided by the patient. No language interpreter was used.       Home Medications Prior to Admission medications   Medication Sig Start Date End Date Taking? Authorizing Provider  dexamethasone (DECADRON) 2 MG tablet Take 4 mg by mouth See admin instructions. 12/24/23   [provider]  oxyCODONE (OXY IR/ROXICODONE) 5 MG immediate release tablet Take 5 mg by mouth every 6 (six) hours as needed for moderate pain (pain score 4-6). 12/24/23   [provider]  ALPRAZolam Prudy Feeler) 0.5 MG tablet Take 0.5 mg by mouth every 6 (six) hours as needed. 05/11/17   [provider]  amoxicillin-clavulanate (AUGMENTIN) 400-57 MG/5ML suspension Take 10.9 mLs by mouth 2 (two) times daily. 12/25/23 12/27/23  [provider]  aspirin EC 81 MG tablet Take 81 mg by mouth daily.    [provider]  citalopram (CELEXA) 40 MG tablet Take 40 mg by mouth daily. 05/26/17   [provider]  fish oil-omega-3 fatty acids 1000 MG capsule Take 2 g by mouth daily.    [provider]  gabapentin (NEURONTIN) 600 MG tablet Take 300 mg by mouth 3 (three) times daily.  05/13/17   [provider]  Glucosamine 500 MG CAPS Take 1 capsule by mouth daily.    [provider]  hydrochlorothiazide (HYDRODIURIL) 25 MG tablet Take 1 tablet (25 mg  total) by mouth daily. 09/28/13   [place], Peter M, MD  ibuprofen (ADVIL) 800 MG tablet Take 1 tablet (800 mg total) by mouth every 8 (eight) hours as needed. 09/29/19   Judi Saa, DO  Ibuprofen-Famotidine 800-26.6 MG TABS Take 1 tablet 3 times daily. 07/27/16   Judi Saa, DO  levETIRAcetam (KEPPRA) 500 MG tablet Take 1 tablet (500 mg total) by mouth 2 (two) times daily. 06/03/17   York Spaniel, MD  losartan (COZAAR) 100 MG tablet Take 100 mg by mouth daily. 05/11/17   [provider]  meloxicam (MOBIC) 15 MG tablet Take 1 tablet (15 mg total) by mouth daily. 02/02/23   Richardean Sale, DO  metFORMIN (GLUCOPHAGE) 500 MG tablet Take 500 mg by mouth daily with breakfast.    [provider]  metoprolol succinate (TOPROL-XL) 100 MG 24 hr tablet Take 100 mg by mouth every evening. Take with or immediately following a meal.    [provider]  montelukast (SINGULAIR) 10 MG tablet TAKE 1 TABLET BY MOUTH ONCE DAILY EVERY EVENING 03/25/17   [provider]  MOUNJARO 5 MG/0.5ML Pen Inject 5 mg into the skin once a week.    [provider]  Multiple Vitamin (MULTIVITAMIN WITH MINERALS) TABS tablet Take 1 tablet by mouth daily.    [provider]  ondansetron (ZOFRAN ODT) 4 MG disintegrating tablet Take 1 tablet (4 mg total) by mouth every 8 (eight)  hours as needed for nausea or vomiting. 03/28/17   Mabe, Latanya Maudlin, MD  oxybutynin (DITROPAN-XL) 5 MG 24 hr tablet Take 1 tablet by mouth daily.    [provider]  oxyCODONE-acetaminophen (PERCOCET/ROXICET) 5-325 MG tablet Take 1 tablet by mouth every 6 (six) hours as needed. for pain 05/18/17   [provider]  pantoprazole (PROTONIX) 40 MG tablet Take 1 tablet by mouth daily.    [provider]  PROAIR HFA [phone] 9850 Base) MCG/ACT inhaler Inhale 2 puffs into the lungs every 6 (six) hours as needed. 03/25/17   [provider]  traZODone (DESYREL) 50 MG tablet Take 0.5-1  tablets (25-50 mg total) by mouth at bedtime as needed for sleep. 08/13/22   Judi Saa, DO  vitamin E 400 UNIT capsule Take 400 Units by mouth daily.    [provider]  zolpidem (AMBIEN) 10 MG tablet Take 5 mg by mouth at bedtime as needed (insomnia).    [provider]      Allergies    Amlodipine and Lisinopril    Review of Systems   Review of Systems  Constitutional:  Negative for chills and fever.  HENT:  Positive for trouble swallowing. Negative for sore throat.   Respiratory:  Negative for shortness of breath.   Cardiovascular:  Negative for chest pain.  Genitourinary:  Negative for dysuria.  Neurological:  Positive for weakness. Negative for light-headedness.  All other systems reviewed and are negative.   Physical Exam Updated Vital Signs BP (!) 148/70   Pulse 63   Temp 98 F (36.7 C)   Resp 17   SpO2 98%  Physical Exam Vitals and nursing note reviewed.  Constitutional:      General: She is not in acute distress.    Appearance: Normal appearance. She is not ill-appearing.  HENT:     Head: Normocephalic and atraumatic.     Nose: Nose normal.     Mouth/Throat:     Comments: See attached picture for lesion that is on the tongue. Eyes:     General: No scleral icterus.    Extraocular Movements: Extraocular movements intact.     Conjunctiva/sclera: Conjunctivae normal.  Cardiovascular:     Rate and Rhythm: Normal rate and regular rhythm.  Pulmonary:     Effort: Pulmonary effort is normal. No respiratory distress.     Breath sounds: Normal breath sounds. No wheezing or rales.  Abdominal:     General: There is no distension.  Musculoskeletal:        General: Normal range of motion.     Cervical back: Normal range of motion.  Skin:    General: Skin is warm and dry.  Neurological:     General: No focal deficit present.     Mental Status: She is alert. Mental status is at baseline.     ED Results / Procedures / Treatments   Labs (all  labs ordered are listed, but only abnormal results are displayed) Labs Reviewed  CBC WITH DIFFERENTIAL/PLATELET - Abnormal; Notable for the following components:      Result Value   WBC 16.2 (*)    Neutro Abs 14.5 (*)    Abs Immature Granulocytes 0.19 (*)    All other components within normal limits  CULTURE, BLOOD (ROUTINE X 2)  CULTURE, BLOOD (ROUTINE X 2)  PROTIME-INR  COMPREHENSIVE METABOLIC PANEL  URINALYSIS, W/ REFLEX TO CULTURE (INFECTION SUSPECTED)  FUNGITELL BETA-D-GLUCAN  HIV ANTIBODY (ROUTINE TESTING W REFLEX)  I-STAT CG4  LACTIC ACID, ED  I-STAT CG4 LACTIC ACID, ED    EKG None  Radiology No results found.  Procedures Procedures    Medications Ordered in ED Medications  montelukast (SINGULAIR) tablet 10 mg (has no administration in time range)  albuterol (VENTOLIN HFA) 108 (90 Base) MCG/ACT inhaler 2 puff (has no administration in time range)  irbesartan (AVAPRO) tablet 300 mg (has no administration in time range)  citalopram (CELEXA) tablet 40 mg (has no administration in time range)  metoprolol succinate (TOPROL-XL) 24 hr tablet 100 mg (has no administration in time range)  hydrochlorothiazide (HYDRODIURIL) tablet 25 mg (has no administration in time range)  enoxaparin (LOVENOX) injection 40 mg (has no administration in time range)  sodium chloride flush (NS) 0.9 % injection 3 mL (has no administration in time range)  acetaminophen (TYLENOL) tablet 650 mg (has no administration in time range)    Or  acetaminophen (TYLENOL) suppository 650 mg (has no administration in time range)  polyethylene glycol (MIRALAX / GLYCOLAX) packet 17 g (has no administration in time range)    ED Course/ Medical Decision Making/ A&P Clinical Course as of 12/27/23 1524  Mon Dec 27, 2023  1400 Spoke with infectious disease Dr. Drue Second.  They will start patient on voriconazole and recommend admission and a CT of the chest.  We also recommend ENT consult. [AA]  [id number] Spoke with Dr.  Pollyann Kennedy of ENT.  He will evaluate patient later on today. [AA]    Clinical Course User Index [AA] Marita Kansas, PA-C                                 Medical Decision Making Risk Decision regarding hospitalization.   Medical Decision Making / ED Course   This patient presents to the ED for concern of fungal infection, this involves an extensive number of treatment options, and is a complaint that carries with it a high risk of complications and morbidity.  The differential diagnosis includes Aspergillus, candidiasis, other fungal infection  MDM: 68 year old female presents today for concern of fungal infection.  She was previously seen at [facility] after her cruise.  She was actually quite sick on her cruise and had to be admitted to the ICU.  Intubation was held due to the amount of angioedema.  She was then admitted [facility] [place].  She is originally from [place] so after she was discharged she returned to [place] with her husband.  I spoke to the ENT clinic in [place] who was able to fax over pathology results.  I spoke with her infectious disease doctor who will evaluate patient.  Recommends admission.  ENT consult requested from Dr. Pollyann Kennedy.  Discussed with hospitalist.  They we will plan to admit patient.  After she was evaluated by Dr. Ilsa Iha and Dr. Pollyann Kennedy at bedside plan was to treat patient outpatient and follow-up with ID in 2 weeks.  CBC shows leukocytosis with some left shift.  This could be reactive.  No anemia.  CMP with without AKI or other acute concerns.  Chest x-ray without acute cardiopulmonary process.  ID recommends discharge and outpatient follow-up.  Fluconazole prescribed.  Dexamethasone 1 mg prescribed for 3 days.  Advised patient to stop ARB, antibiotic.  Patient voices understanding and is in agreement with plan.   Lab Tests: -I ordered, reviewed, and interpreted labs.   The pertinent results include:   Labs Reviewed  COMPREHENSIVE METABOLIC PANEL - Abnormal; Notable for the following components:      Result Value   CO2 21 (*)    Glucose, Bld 195 (*)    BUN 28 (*)    Albumin 2.5 (*)    All other components within normal limits  CBC WITH DIFFERENTIAL/PLATELET - Abnormal; Notable for the following components:   WBC 16.2 (*)    Neutro Abs 14.5 (*)    Abs Immature Granulocytes 0.19 (*)    All other components within normal limits  CULTURE, BLOOD (ROUTINE X 2)  CULTURE, BLOOD (ROUTINE X 2)  PROTIME-INR  URINALYSIS, W/ REFLEX TO CULTURE (INFECTION SUSPECTED)  FUNGITELL BETA-D-GLUCAN  I-STAT CG4 LACTIC ACID, ED  I-STAT CG4 LACTIC ACID, ED      EKG  EKG Interpretation Date/Time:    Ventricular Rate:    PR Interval:    QRS Duration:    QT Interval:    QTC Calculation:   R Axis:      Text Interpretation:           Imaging Studies ordered: I ordered imaging studies including chest x-ray I independently visualized and interpreted imaging. I agree with the radiologist interpretation   Medicines ordered and prescription drug management: Meds ordered this encounter  Medications   montelukast (SINGULAIR) tablet 10 mg   albuterol (VENTOLIN HFA) 108 (90 Base) MCG/ACT inhaler 2 puff   irbesartan (AVAPRO) tablet 300 mg   citalopram (CELEXA) tablet 40 mg   metoprolol succinate (TOPROL-XL) 24 hr tablet 100 mg    Take with or immediately following a meal.     hydrochlorothiazide (HYDRODIURIL) tablet 25 mg   DISCONTD: enoxaparin (LOVENOX) injection 40 mg   sodium chloride flush (NS) 0.9 % injection 3 mL   OR Linked Order Group    acetaminophen (TYLENOL) tablet 650 mg    acetaminophen (TYLENOL) suppository 650 mg   polyethylene glycol (MIRALAX / GLYCOLAX) packet 17 g   fluconazole (DIFLUCAN) IVPB 200 mg   fluconazole (DIFLUCAN) 200 MG tablet    Sig: Take 1 tablet (200 mg total) by mouth daily for 14 days.    Dispense:  14 tablet    Refill:  0    Supervising Provider:   Hyacinth Meeker, BRIAN  [[id number]]   dexamethasone (DECADRON) 1 MG tablet    Sig: Take 1 tablet (1 mg total) by mouth daily with breakfast for 3 days.    Dispense:  3 tablet    Refill:  0    Supervising Provider:   Hyacinth Meeker, BRIAN [[id number]]    -I have reviewed the patients home medicines and have made adjustments as needed   Co morbidities that complicate the patient evaluation  Past Medical History:  Diagnosis Date   Anxiety    Depression    Encephalitis    Hepatitis    ? B when age 47   HTN (hypertension) 09/28/2013   Hypertension    Migraines    Trigeminal neuralgia       Dispostion: Discharged in stable condition.  Return precaution discussed.  Patient voices understanding and is in agreement with plan.    Final Clinical Impression(s) / ED Diagnoses Final diagnoses:  Fungal infection    Rx / DC Orders ED Discharge Orders          Ordered    fluconazole (DIFLUCAN) 200 MG tablet  Daily        12/27/23 1549    dexamethasone (DECADRON) 1 MG tablet  Daily with breakfast  12/27/23 1549              Marita Kansas, PA-C 12/27/23 1604    Wynetta Fines, MD 12/27/23 [phone]

## 2023-12-27 NOTE — Discharge Instructions (Addendum)
 Diet as tolerated.  Rinse oral cavity with salt water 3 times daily.  You are seen by infectious disease as well as ENT.  They advised fluconazole for 2 weeks.  Slowly tapering the steroids over the next 3 days.  Take 1 mg once daily for 3 days.  Stop taking the antibiotic Augmentin.  Stop your medication irbesartan/losartan.  These are in the same class of family.  You are likely taken one of the 2 of these.  Whichever one you are taking please stop taking it according to the ENT recommendations.  Follow-up with them outpatient.  Return to the emergency room for any concerning symptoms.

## 2023-12-27 NOTE — Consult Note (Signed)
 [facility]    Date of Admission:  12/27/2023     Total days of antibiotics 0  PTA Augmentin          Reason for Consult: ?Fungal infection of tongue    Referring Provider: ER Team  Primary Care Provider: Ollen Bowl, MD   Assessment: Rhonda [name] is a 68 y.o. female who presented to the Emergency Room with:    Ulcerations of Tongue with Necrosis - Biopsy with Concern over Invasive Fungal Infection - We saw the patient in conjunction with Dr. Pollyann Kennedy from ENT. He did not feel any debridement was warranted and expects the necrosed tissue to slough off and eventually detach. Overall condition is improved. She does not seem like the right patient to have invasive fungal infection - not immunosuppressed. Would still need to follow her closely to ensure no malignancy in place once this necrotic tissue unroof's.  Would stop antibiotics (only documented positive culture was a throat culture that revealed light growth of GAS that seems more consistent with colonization).  She does have some thrush on the posterior soft palate but can swallow OK and expresses hunger.  -Will start diflucan - one IV dose while in ER today then start PO 200 mg fluconazole tomorrow 2/18 daily x 2 weeks until ID follow up.  -Taper off steroids -Stop antibiotics  -Stop ARB with question of traumatic/triggered angioedema on this.  Medication Management -  On citalapram.  -Will check EKG to ensure no QTc barriers at baseline  -Repeat at follow up if going to extend out    H/O Multifocal PNA -  Facial Cellulitis / Angioedema -  She has been well treated for PNA with resolution on CXR here.  ?triggered angioedema with chronic ARB.     Plan: FU Fungitell FU additional aspergillous / fungal stains from biopsy that was done at OSH Diflucan 200 mg daily (IV first dose now before she leaves) then PO 200 mg daily x 14d Stop antibiotics  Taper dex FU with Dr. Drue Second  arranged for 2/25    Active Problems:   HTN (hypertension)   Anxiety   Depression   Type 2 diabetes mellitus (HCC)     HPI: Rhonda [name] is a 68 y.o. female here in ER with:  Her husband is here today to help with history. H/O trigeminal neuralgia. Cruise 2/2. On the ship had a scratchy throat and some facial pain they tx w/ gabapentin+tramadol. Continued to worsen and became very disoriented and unable to stand while on cruise ship. Her tongue was very swollen in the setting of a bad strep infection - airway concern and attempted to intubate in ICU. She had IV antibiotics (cefepime) and steroids for strep infection with angioedema and facial cellulitis. Attempted to arrange transfer off cruise ship due to condition. Never required intubation and did maintain own airway.  [place]seen in [place] hospital an Biopsy of the top of tongue in [facility] hospital - report with "invasive fungal infection" and advised to go urgently to ER for evaluation   Tongue appearance was normal prior to this episode - has some burning sensations d/t TN but nothing appearance wise that the have noted. Back in [place] since 2/14 PM, still on steroid taper and antibiotics (down to dexamethasone 1 mg).   Review of Systems: Review of Systems  Constitutional:  Negative for chills and fever.  HENT:  Positive for  sore throat. Negative for congestion, ear pain, nosebleeds, sinus pain and tinnitus.   Eyes:  Negative for blurred vision.  Respiratory:  Positive for cough. Negative for sputum production and shortness of breath.   Cardiovascular:  Negative for chest pain.  Gastrointestinal:  Negative for abdominal pain, nausea and vomiting.  Genitourinary:  Negative for dysuria.  Musculoskeletal:  Positive for neck pain (anteror neck discomfort). Negative for back pain and joint pain.  Skin:  Negative for rash.    Past Medical History:  Diagnosis Date   Anxiety    Depression    Encephalitis     Hepatitis    ? B when age 59   HTN (hypertension) 09/28/2013   Hypertension    Migraines    Trigeminal neuralgia     Social History   Tobacco Use   Smoking status: Never   Smokeless tobacco: Never  Vaping Use   Vaping status: Never Used  Substance Use Topics   Alcohol use: Yes    Comment: occassionally wine   Drug use: No    Family History  Problem Relation Age of Onset   Arthritis Mother    Hyperlipidemia Mother    Heart disease Mother    Stroke Mother    Hypertension Mother    Diabetes Mother    Arthritis Father    Hyperlipidemia Father    Heart disease Father    Stroke Father    Hypertension Father    Diabetes Father    Cancer Maternal Grandmother    Kidney disease Maternal Grandmother    Mental illness Maternal Grandmother    Hypertension Maternal Grandmother    Heart disease Maternal Grandmother    Cancer Maternal Grandfather    Kidney disease Maternal Grandfather    Mental illness Maternal Grandfather    Hypertension Maternal Grandfather    Heart disease Maternal Grandfather    Cancer Paternal Grandmother    Kidney disease Paternal Grandmother    Mental illness Paternal Grandmother    Hypertension Paternal Grandmother    Heart disease Paternal Grandmother    Cancer Paternal Grandfather    Kidney disease Paternal Grandfather    Mental illness Paternal Grandfather    Hypertension Paternal Grandfather    Heart disease Paternal Grandfather    Allergies  Allergen Reactions   Amlodipine Swelling   Lisinopril Cough    OBJECTIVE: Blood pressure (!) 148/70, pulse 63, temperature 98 F (36.7 C), resp. rate 17, SpO2 98%.  Physical Exam HENT:     Mouth/Throat:     Pharynx: Oropharyngeal exudate (thick adherent tan/grey slough noted to left side of tongue, 1/3 involvement) present.  Cardiovascular:     Rate and Rhythm: Normal rate.  Pulmonary:     Effort: Pulmonary effort is normal.     Breath sounds: Normal breath sounds.  Abdominal:     General:  Bowel sounds are normal. There is no distension.     Palpations: Abdomen is soft.  Musculoskeletal:        General: Normal range of motion.     Cervical back: Normal range of motion.  Lymphadenopathy:     Cervical: No cervical adenopathy.  Skin:    General: Skin is warm and dry.     Capillary Refill: Capillary refill takes less than 2 seconds.  Neurological:     Mental Status: She is oriented to person, place, and time.  Psychiatric:        Behavior: Behavior normal.      Lab Results Lab Results  Component  Value Date   WBC 4.8 03/28/2017   HGB 14.4 03/28/2017   HCT 42.4 03/28/2017   MCV 93.2 03/28/2017   PLT 207 03/28/2017    Lab Results  Component Value Date   CREATININE 0.84 06/03/2017   BUN 17 06/03/2017   NA 139 06/03/2017   K 5.1 06/03/2017   CL 99 06/03/2017   CO2 25 06/03/2017    Lab Results  Component Value Date   ALT 71 (H) 06/03/2017   AST 45 (H) 06/03/2017   ALKPHOS 75 06/03/2017   BILITOT 0.2 06/03/2017     Microbiology: No results found for this or any previous visit (from the past 240 hours).  Rexene Alberts, MSN, NP-C [facility] Infectious Disease [facility] Pager: [phone]  12/27/2023 2:57 PM

## 2023-12-27 NOTE — Consult Note (Signed)
 Reason for Consult: Oral cavity fungal infection Referring Physician: Synetta Fail, MD  Rhonda [name] is an 68 y.o. female.  HPI: Very complicated history, previously healthy woman was in [place], boarded a cruise ship for 1 week and subsequently got very ill during that week.  She started off with pain in the left side of the face and then it progressed into severe left tongue swelling and pain.  He has a history of trigeminal neuralgia bilaterally.  She has had 2 craniotomies for that and 3 times gamma knife treatments.  She was treated with pain medicine.  At some point she was started on oral antibiotics.  When the ship return to [place] she was hospitalized for several days.  A biopsy was performed of the tongue.  Patient returned home and then they received an urgent phone call saying that there was invasive fungus and they need to come to the hospital immediately.  She actually feeling a lot better today than she had been feeling.  She has minimal recollection of the cruise in the hospitalization but she is actually starting to eat a little bit now.  Her husband is with her and he is provided majority of the history.  At 1 point her neck was severely swollen as well.  There was talk about intubating but ultimately it was found to be not necessary.  She was treated with antibiotics for presumable group A strep sepsis and ultimately was diagnosed with pneumonia as well.  Past Medical History:  Diagnosis Date   Anxiety    Depression    Encephalitis    Hepatitis    ? B when age 69   HTN (hypertension) 09/28/2013   Hypertension    Migraines    Trigeminal neuralgia     Past Surgical History:  Procedure Laterality Date   BRAIN SURGERY  2013   trigeminal neuralgia   BREAST SURGERY     age 52-benign,   COLONOSCOPY WITH PROPOFOL N/A 08/01/2013   Procedure: COLONOSCOPY WITH PROPOFOL;  Surgeon: Charolett Bumpers, MD;  Location: [facility];  Service: Endoscopy;  Laterality: N/A;   CRANIOTOMY      gamma knife     x1    Family History  Problem Relation Age of Onset   Arthritis Mother    Hyperlipidemia Mother    Heart disease Mother    Stroke Mother    Hypertension Mother    Diabetes Mother    Arthritis Father    Hyperlipidemia Father    Heart disease Father    Stroke Father    Hypertension Father    Diabetes Father    Cancer Maternal Grandmother    Kidney disease Maternal Grandmother    Mental illness Maternal Grandmother    Hypertension Maternal Grandmother    Heart disease Maternal Grandmother    Cancer Maternal Grandfather    Kidney disease Maternal Grandfather    Mental illness Maternal Grandfather    Hypertension Maternal Grandfather    Heart disease Maternal Grandfather    Cancer Paternal Grandmother    Kidney disease Paternal Grandmother    Mental illness Paternal Grandmother    Hypertension Paternal Grandmother    Heart disease Paternal Grandmother    Cancer Paternal Grandfather    Kidney disease Paternal Grandfather    Mental illness Paternal Grandfather    Hypertension Paternal Grandfather    Heart disease Paternal Grandfather     Social History:  reports that she has never smoked. She has never used smokeless tobacco.  She reports current alcohol use. She reports that she does not use drugs.  Allergies:  Allergies  Allergen Reactions   Amlodipine Swelling   Lisinopril Cough    Medications: Reviewed  Results for orders placed or performed during the hospital encounter of 12/27/23 (from the past 48 hours)  Comprehensive metabolic panel     Status: Abnormal   Collection Time: 12/27/23  1:49 PM  Result Value Ref Range   Sodium 135 135 - 145 mmol/L   Potassium 4.4 3.5 - 5.1 mmol/L   Chloride 106 98 - 111 mmol/L   CO2 21 (L) 22 - 32 mmol/L   Glucose, Bld 195 (H) 70 - 99 mg/dL    Comment: Glucose reference range applies only to samples taken after fasting for at least 8 hours.   BUN 28 (H) 8 - 23 mg/dL   Creatinine, Ser 1.61 0.44 - 1.00 mg/dL    Calcium 9.0 8.9 - 09.6 mg/dL   Total Protein 6.5 6.5 - 8.1 g/dL   Albumin 2.5 (L) 3.5 - 5.0 g/dL   AST 26 15 - 41 U/L   ALT 39 0 - 44 U/L   Alkaline Phosphatase 52 38 - 126 U/L   Total Bilirubin 0.7 0.0 - 1.2 mg/dL   GFR, Estimated >04 >54 mL/min    Comment: (NOTE) Calculated using the CKD-EPI Creatinine Equation (2021)    Anion gap 8 5 - 15    Comment: Performed at [facility], [address], [place], [place] [address]  CBC with Differential     Status: Abnormal   Collection Time: 12/27/23  1:49 PM  Result Value Ref Range   WBC 16.2 (H) 4.0 - 10.5 K/uL   RBC 4.03 3.87 - 5.11 MIL/uL   Hemoglobin 12.6 12.0 - 15.0 g/dL   HCT 91.4 78.2 - 95.6 %   MCV 94.3 80.0 - 100.0 fL   MCH 31.3 26.0 - 34.0 pg   MCHC 33.2 30.0 - 36.0 g/dL   RDW 21.3 08.6 - 57.8 %   Platelets 350 150 - 400 K/uL   nRBC 0.0 0.0 - 0.2 %   Neutrophils Relative % 91 %   Neutro Abs 14.5 (H) 1.7 - 7.7 K/uL   Lymphocytes Relative 5 %   Lymphs Abs 0.8 0.7 - 4.0 K/uL   Monocytes Relative 3 %   Monocytes Absolute 0.6 0.1 - 1.0 K/uL   Eosinophils Relative 0 %   Eosinophils Absolute 0.0 0.0 - 0.5 K/uL   Basophils Relative 0 %   Basophils Absolute 0.0 0.0 - 0.1 K/uL   Immature Granulocytes 1 %   Abs Immature Granulocytes 0.19 (H) 0.00 - 0.07 K/uL    Comment: Performed at [facility], [address], [place], [place] [address]  I-Stat Lactic Acid, ED     Status: None   Collection Time: 12/27/23  1:56 PM  Result Value Ref Range   Lactic Acid, Venous 1.3 0.5 - 1.9 mmol/L  Protime-INR     Status: None   Collection Time: 12/27/23  2:35 PM  Result Value Ref Range   Prothrombin Time 13.8 11.4 - 15.2 seconds   INR 1.0 0.8 - 1.2    Comment: (NOTE) INR goal varies based on device and disease states. Performed at [facility], [address] [place], [place] [address]   I-Stat Lactic Acid, ED     Status: None   Collection Time: 12/27/23  2:53 PM  Result Value Ref Range  Lactic Acid, Venous 1.3  0.5 - 1.9 mmol/L    No results found.  [id number] except as listed in admit H&P  Blood pressure (!) 148/70, pulse 63, temperature 98 F (36.7 C), resp. rate 17, SpO2 98%.  PHYSICAL EXAM: Overall appearance:  Healthy appearing, in no distress, breathing and voice are normal. Head:  Normocephalic, atraumatic. Ears: External ears look normal. Nose: External nose is healthy in appearance. Internal nasal exam free of any lesions or obstruction. Oral Cavity/Pharynx: There is a 5-6 cm region of the left oral lateral tongue and floor of mouth with necrotic mucosa.  There is moist eschar present.  The tongue mobility is preserved.  The lips look healthy and clear.  The pharynx is healthy and clear. Larynx/Hypopharynx: Deferred Neuro:  No identifiable neurologic deficits. Neck: No palpable neck masses.  Studies Reviewed: none  Procedures: none   Assessment/Plan: Very unusual case of a necrotic mucositis of the oral cavity.  Multiple possibilities including varicella.  She is improving now.  She has no airway issues.  She is starting to be able to eat now.  There is a large area of necrosis of the tongue mucosa which will ultimately slough and reepithelialized but that is probably going to take a few weeks.  There is no evidence of invasive fungus and she has no immune deficiency to explain that.  The neck edema may have been related to angioedema from the acute infection on top of chronic ARB use.  That seems to be completely resolved now.  Infectious disease has seen her as well with me.  I feel this can be managed as an outpatient.  She will follow-up with me in a week or sooner if necessary.  Medical Decision Making: #/Complex Problems: 4  Data Reviewed:4  Management:4 (1-Straightforward, 2-Low, 3-Moderate, 4-High)  K14.8 G50.0   Serena Colonel 12/27/2023, 3:42 PM

## 2023-12-27 NOTE — ED Triage Notes (Signed)
 Pt arrives via EMS from home. Pt recently took a cruise. She was diagnosed with strep, she ended up spending a week in a hospital in [place] due to strep turning into sepsis. Pt has been home for the past 3 days, continuing abx and steroids. She received a call from one of the doctors in [place] and was told she had positive blood cultures and that she would need antifungal meds. Pt arrives AxOx4. She reports fatigue and weakness.

## 2023-12-31 LAB — FUNGITELL BETA-D-GLUCAN
Fungitell Value:: <31.25 pg/mL
Result Name:: NEGATIVE

## 2024-01-01 LAB — CULTURE, BLOOD (ROUTINE X 2)
Culture: NO GROWTH
Culture: NO GROWTH
Special Requests: ADEQUATE
Special Requests: ADEQUATE

## 2024-01-04 ENCOUNTER — Other Ambulatory Visit: Payer: Self-pay

## 2024-01-04 ENCOUNTER — Ambulatory Visit: Payer: Medicare Other | Admitting: Internal Medicine

## 2024-01-04 ENCOUNTER — Encounter: Payer: Self-pay | Admitting: Internal Medicine

## 2024-01-04 VITALS — BP 115/77 | HR 80 | Temp 97.3°F | Ht 62.5 in | Wt 133.0 lb

## 2024-01-04 DIAGNOSIS — K148 Other diseases of tongue: Secondary | ICD-10-CM | POA: Diagnosis not present

## 2024-01-04 DIAGNOSIS — B370 Candidal stomatitis: Secondary | ICD-10-CM | POA: Diagnosis not present

## 2024-01-04 NOTE — Progress Notes (Unsigned)
 Patient ID: Rhonda [name], female   DOB: November 09, 1956, 68 y.o.   MRN: [id number]  HPI Salt water rinses Coconut oil  Sees [facility]sen and pcp    Outpatient Encounter Medications as of 01/04/2024  Medication Sig   ALPRAZolam (XANAX) 0.5 MG tablet Take 0.5 mg by mouth every 6 (six) hours as needed.   aspirin EC 81 MG tablet Take 81 mg by mouth daily.   citalopram (CELEXA) 40 MG tablet Take 40 mg by mouth daily.   fish oil-omega-3 fatty acids 1000 MG capsule Take 2 g by mouth daily.   fluconazole (DIFLUCAN) 200 MG tablet Take 1 tablet (200 mg total) by mouth daily for 14 days.   Glucosamine 500 MG CAPS Take 1 capsule by mouth daily.   ibuprofen (ADVIL) 800 MG tablet Take 1 tablet (800 mg total) by mouth every 8 (eight) hours as needed.   Ibuprofen-Famotidine 800-26.6 MG TABS Take 1 tablet 3 times daily.   meloxicam (MOBIC) 15 MG tablet Take 1 tablet (15 mg total) by mouth daily.   metFORMIN (GLUCOPHAGE) 500 MG tablet Take 500 mg by mouth daily with breakfast.   metoprolol succinate (TOPROL-XL) 100 MG 24 hr tablet Take 100 mg by mouth every evening. Take with or immediately following a meal.   montelukast (SINGULAIR) 10 MG tablet TAKE 1 TABLET BY MOUTH ONCE DAILY EVERY EVENING   MOUNJARO 5 MG/0.5ML Pen Inject 5 mg into the skin once a week.   Multiple Vitamin (MULTIVITAMIN WITH MINERALS) TABS tablet Take 1 tablet by mouth daily.   olmesartan-hydrochlorothiazide (BENICAR HCT) 40-25 MG tablet Take 1 tablet by mouth daily.   ondansetron (ZOFRAN ODT) 4 MG disintegrating tablet Take 1 tablet (4 mg total) by mouth every 8 (eight) hours as needed for nausea or vomiting.   oxyCODONE (OXY IR/ROXICODONE) 5 MG immediate release tablet Take 5 mg by mouth every 6 (six) hours as needed for moderate pain (pain score 4-6).   pantoprazole (PROTONIX) 40 MG tablet Take 1 tablet by mouth daily.   PROAIR HFA 108 (90 Base) MCG/ACT inhaler Inhale 2 puffs into the lungs every 6 (six) hours as needed.   vitamin E  400 UNIT capsule Take 400 Units by mouth daily.   zolpidem (AMBIEN) 10 MG tablet Take 5 mg by mouth at bedtime as needed (insomnia).   gabapentin (NEURONTIN) 600 MG tablet Take 300 mg by mouth 3 (three) times daily.  (Patient not taking: Reported on 01/04/2024)   hydrochlorothiazide (HYDRODIURIL) 25 MG tablet Take 1 tablet (25 mg total) by mouth daily. (Patient not taking: Reported on 01/04/2024)   levETIRAcetam (KEPPRA) 500 MG tablet Take 1 tablet (500 mg total) by mouth 2 (two) times daily. (Patient not taking: Reported on 01/04/2024)   losartan (COZAAR) 100 MG tablet Take 100 mg by mouth daily. (Patient not taking: Reported on 01/04/2024)   oxybutynin (DITROPAN-XL) 5 MG 24 hr tablet Take 1 tablet by mouth daily. (Patient not taking: Reported on 01/04/2024)   traZODone (DESYREL) 50 MG tablet Take 0.5-1 tablets (25-50 mg total) by mouth at bedtime as needed for sleep. (Patient not taking: Reported on 01/04/2024)   No facility-administered encounter medications on file as of 01/04/2024.     Patient Active Problem List   Diagnosis Date Noted   Fungal infection 12/27/2023   Arthritis of right acromioclavicular joint 09/09/2023   Greater trochanteric bursitis of right hip 03/02/2022   Whiplash injuries 10/13/2019   Type 2 diabetes mellitus (HCC) 08/09/2019   Degenerative arthritis of  knee, bilateral 06/16/2018   Trigeminal neuralgia 06/03/2017   CMC arthritis 07/27/2016   De Quervain's tenosynovitis, bilateral 01/16/2015   Trigger point of thoracic region 11/15/2014   Nonallopathic lesion of thoracic region 11/15/2014   Nonallopathic lesion of cervical region 11/15/2014   Nonallopathic lesion-rib cage 11/15/2014   Posterior interosseous nerve injury 11/22/2013   HTN (hypertension) 09/28/2013   Chest tightness 09/28/2013   Blood glucose elevated 09/28/2013   Lateral epicondylitis of right elbow 08/11/2013   Anxiety 11/12/2011   Depression 11/12/2011     Health Maintenance Due  Topic Date  Due   COVID-19 Vaccine (1) Never done   Pneumonia Vaccine 20+ Years old (1 of 2 - PCV) Never done   FOOT EXAM  Never done   OPHTHALMOLOGY EXAM  Never done   Diabetic kidney evaluation - Urine ACR  Never done   Hepatitis C Screening  Never done   DTaP/Tdap/Td (1 - Tdap) Never done   Zoster Vaccines- Shingrix (1 of 2) Never done   HEMOGLOBIN A1C  06/05/2014   INFLUENZA VACCINE  Never done   Colonoscopy  08/02/2023     Review of Systems  Physical Exam   BP 115/77   Pulse 80   Temp (!) 97.3 F (36.3 C) (Oral)   Ht 5' 2.5" (1.588 m)   Wt 133 lb (60.3 kg)   SpO2 96%   BMI 23.94 kg/m    No results found for: "CD4TCELL" No results found for: "CD4TABS" No results found for: "HIV1RNAQUANT" No results found for: "HEPBSAB" No results found for: "RPR", "LABRPR"  CBC Lab Results  Component Value Date   WBC 16.2 (H) 12/27/2023   RBC 4.03 12/27/2023   HGB 12.6 12/27/2023   HCT 38.0 12/27/2023   PLT 350 12/27/2023   MCV 94.3 12/27/2023   MCH 31.3 12/27/2023   MCHC 33.2 12/27/2023   RDW 13.2 12/27/2023   LYMPHSABS 0.8 12/27/2023   MONOABS 0.6 12/27/2023   EOSABS 0.0 12/27/2023    BMET Lab Results  Component Value Date   NA 135 12/27/2023   K 4.4 12/27/2023   CL 106 12/27/2023   CO2 21 (L) 12/27/2023   GLUCOSE 195 (H) 12/27/2023   BUN 28 (H) 12/27/2023   CREATININE 0.86 12/27/2023   CALCIUM 9.0 12/27/2023   GFRNONAA >60 12/27/2023   GFRAA 86 06/03/2017      Assessment and Plan

## 2024-01-06 NOTE — Progress Notes (Signed)
 To assess for coagulopathy

## 2024-05-02 ENCOUNTER — Other Ambulatory Visit (HOSPITAL_BASED_OUTPATIENT_CLINIC_OR_DEPARTMENT_OTHER): Payer: Self-pay | Admitting: Internal Medicine

## 2024-05-02 DIAGNOSIS — E2839 Other primary ovarian failure: Secondary | ICD-10-CM

## 2024-05-18 ENCOUNTER — Other Ambulatory Visit: Payer: Self-pay | Admitting: Internal Medicine

## 2024-05-18 DIAGNOSIS — Z0000 Encounter for general adult medical examination without abnormal findings: Secondary | ICD-10-CM

## 2024-06-05 ENCOUNTER — Ambulatory Visit
Admission: RE | Admit: 2024-06-05 | Discharge: 2024-06-05 | Disposition: A | Discharge status: Home / Self Care | Source: Ambulatory Visit | Attending: Internal Medicine | Admitting: Internal Medicine

## 2024-06-05 DIAGNOSIS — Z0000 Encounter for general adult medical examination without abnormal findings: Secondary | ICD-10-CM

## 2024-06-12 ENCOUNTER — Telehealth: Payer: Self-pay

## 2024-06-12 ENCOUNTER — Encounter: Payer: Self-pay | Admitting: Diagnostic Neuroimaging

## 2024-06-12 ENCOUNTER — Telehealth: Payer: Self-pay | Admitting: Family Medicine

## 2024-06-12 ENCOUNTER — Ambulatory Visit: Admitting: Diagnostic Neuroimaging

## 2024-06-12 VITALS — BP 148/74 | HR 58 | Ht 63.0 in | Wt 162.0 lb

## 2024-06-12 DIAGNOSIS — G500 Trigeminal neuralgia: Secondary | ICD-10-CM | POA: Diagnosis not present

## 2024-06-12 NOTE — Telephone Encounter (Signed)
 Patient called and is having back pain. It is hard to move and take deep breaths. She is asking if Dr. Claudene can see her any sooner than sept 18th. Please advise.

## 2024-06-12 NOTE — Telephone Encounter (Signed)
 Received voicemail from patient stating she saw Dr. Luiz earlier this year for a fungal infection and was advised she did not have aspergillus.   She states she just received something from the hospital in [place] stating that she does have aspergillus and she would like to know if she was treated for this.   Called patient back, she was notified back in March that tongue biopsy was positive for Aspergillus. She reports recurring tongue lesions similar to what she experienced in February.   Najee Manninen, BSN, RN

## 2024-06-12 NOTE — Patient Instructions (Signed)
  BILATERAL TRIGEMINAL NEURALGIA (since ~2006) - continue gabapentin 600mg  three times a day + oxycodone as needed (per PCP or Dr. Nancye)
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
## 2024-06-12 NOTE — Telephone Encounter (Signed)
 Spoke with Arland, relayed provider's recommendation to follow up with Dr. Jesus, ENT.   She states she has an appointment with him later this month.   Lacrecia Delval, BSN, RN

## 2024-06-13 ENCOUNTER — Telehealth: Payer: Self-pay

## 2024-06-13 NOTE — Telephone Encounter (Signed)
 Referral to neurosurgery faxed to Dr Nancye at [facility]  (P) [phone] opt [phone] (F) [phone]

## 2024-06-22 ENCOUNTER — Telehealth: Payer: Self-pay | Admitting: Diagnostic Neuroimaging

## 2024-06-22 NOTE — Telephone Encounter (Signed)
 Pt called stating that she is needing a letter from us  to be exempt from [employer]. Pt states she was referred to a Neurosurgeon but the appt she has is the same day as the [employer] and she can not get a letter from them since she has yet to be seen. Please advise.

## 2024-06-23 ENCOUNTER — Encounter: Payer: Self-pay | Admitting: Neurology

## 2024-06-23 NOTE — Telephone Encounter (Signed)
 Per Dr Margaret he would be ok writing a jury duty given the circumstance that the pt is needes a referral to neuro surgery and the first available time they were able to get her in happens to be the date jury duty is scheduled for. This apt is needed to help establish the patient with a current treatment plan. Dr Margaret has ok'd to complete a jury duty letter. Letter completed and placed on Dr [employer] desk to sign. Once signed pt request to be notified and she will pick it up.

## 2024-06-26 NOTE — Telephone Encounter (Signed)
 Called the pt and made her aware that the letter was completed and signed and placed at front desk. She states that she will plan to pick up on wed 8/20

## 2024-06-27 NOTE — Progress Notes (Unsigned)
 Rhonda [name] [name] [address] [place] [address] Phone: [phone] Subjective:   Rhonda [name], am serving as a scribe for Dr. Arthea [name].  I'm seeing this patient by the request  of:  Pahwani, Velna SAUNDERS, MD  CC: Back pain  YEP:Dlagzrupcz  Rhonda [name] is a 68 y.o. female coming in with complaint of back pain. Last seen in October 2024 for shoulder pain. Previous OMT patient. Patient states that her T spine is very tight especially with movement. Has seen chiro but adjustment lasted only 2 days.       Past Medical History:  Diagnosis Date   Anxiety    Depression    Encephalitis    Hepatitis    ? B when age 49   HTN (hypertension) 09/28/2013   Hypertension    Migraines    Trigeminal neuralgia    Past Surgical History:  Procedure Laterality Date   BRAIN SURGERY  2013   trigeminal neuralgia   BREAST SURGERY     age 89-benign,   COLONOSCOPY WITH PROPOFOL  N/A 08/01/2013   Procedure: COLONOSCOPY WITH PROPOFOL ;  Surgeon: Gladis MARLA Louder, MD;  Location: [facility];  Service: Endoscopy;  Laterality: N/A;   CRANIOTOMY     gamma knife     x1   Social History   Socioeconomic History   Marital status: Married    Spouse name: Not on file   Number of children: Not on file   Years of education: Not on file   Highest education level: Not on file  Occupational History   Occupation: Nurse  Tobacco Use   Smoking status: Never   Smokeless tobacco: Never  Vaping Use   Vaping status: Never Used  Substance and Sexual Activity   Alcohol use: Yes    Comment: occassionally wine   Drug use: No   Sexual activity: Not on file  Other Topics Concern   Not on file  Social History Narrative   2025 Lives with husband, Rhonda [name]   Caffeine use: 1 cup coffee per day   Right handed    Social Drivers of Health   Financial Resource Strain: Low Risk  (12/19/2023)   Received from [place] [facility] [place]   UM SDOH Financial Resource Strain    How  hard is it for you to pay for the very basics like food, housing, medical care, and air conditioning: Not hard at all  Food Insecurity: Low Risk  (12/19/2023)   Received from [place] [facility] [place]   UM SDOH Food Insecurity    Within the past 12 months, the food you bought just didn't last and you didn't have money to get more: Never true  Transportation Needs: Low Risk  (12/19/2023)   Received from [place] [facility] [place]   UM Social Determinants: Transport Needs    Has the lack of transportation kept you from medical appointments or from getting medications: No  Physical Activity: Not on file  Stress: Not on file  Social Connections: Not on file   Allergies  Allergen Reactions   Amlodipine Swelling   Lisinopril Cough   Family History  Problem Relation Age of Onset   Arthritis Mother    Hyperlipidemia Mother    Heart disease Mother    Stroke Mother    Hypertension Mother    Diabetes Mother    Arthritis Father    Hyperlipidemia Father    Heart disease Father    Stroke Father    Hypertension  Father    Diabetes Father    Cancer Maternal Grandmother    Kidney disease Maternal Grandmother    Mental illness Maternal Grandmother    Hypertension Maternal Grandmother    Heart disease Maternal Grandmother    Cancer Maternal Grandfather    Kidney disease Maternal Grandfather    Mental illness Maternal Grandfather    Hypertension Maternal Grandfather    Heart disease Maternal Grandfather    Cancer Paternal Grandmother    Kidney disease Paternal Grandmother    Mental illness Paternal Grandmother    Hypertension Paternal Grandmother    Heart disease Paternal Grandmother    Cancer Paternal Grandfather    Kidney disease Paternal Grandfather    Mental illness Paternal Grandfather    Hypertension Paternal Grandfather    Heart disease Paternal Grandfather     Current Outpatient Medications (Endocrine & Metabolic):    metFORMIN (GLUCOPHAGE) 500 MG tablet, Take 500 mg by mouth  daily with breakfast.   MOUNJARO 5 MG/0.5ML Pen, Inject 5 mg into the skin once a week.  Current Outpatient Medications (Cardiovascular):    hydrochlorothiazide  (HYDRODIURIL ) 25 MG tablet, Take 1 tablet (25 mg total) by mouth daily.   metoprolol  succinate (TOPROL -XL) 100 MG 24 hr tablet, Take 100 mg by mouth every evening. Take with or immediately following a meal.   olmesartan-hydrochlorothiazide  (BENICAR HCT) 40-25 MG tablet, Take 1 tablet by mouth daily.  Current Outpatient Medications (Respiratory):    PROAIR  HFA 108 (90 Base) MCG/ACT inhaler, Inhale 2 puffs into the lungs every 6 (six) hours as needed.  Current Outpatient Medications (Analgesics):    aspirin EC 81 MG tablet, Take 81 mg by mouth daily. (Patient taking differently: Take 81 mg by mouth as needed.)   ibuprofen  (ADVIL ) 800 MG tablet, Take 1 tablet (800 mg total) by mouth every 8 (eight) hours as needed.   meloxicam  (MOBIC ) 15 MG tablet, Take 1 tablet (15 mg total) by mouth daily.   oxyCODONE (OXY IR/ROXICODONE) 5 MG immediate release tablet, Take 5 mg by mouth every 6 (six) hours as needed for moderate pain (pain score 4-6).   traMADol  (ULTRAM ) 50 MG tablet, Take 1 tablet (50 mg total) by mouth every 8 (eight) hours as needed for up to 5 days.   Current Outpatient Medications (Other):    ALPRAZolam (XANAX) 0.5 MG tablet, Take 0.5 mg by mouth every 6 (six) hours as needed.   citalopram  (CELEXA ) 40 MG tablet, Take 40 mg by mouth daily.   fish oil-omega-3 fatty acids 1000 MG capsule, Take 2 g by mouth daily.   gabapentin (NEURONTIN) 600 MG tablet, Take 300 mg by mouth 3 (three) times daily.    Glucosamine 500 MG CAPS, Take 1 capsule by mouth daily.   Multiple Vitamin (MULTIVITAMIN WITH MINERALS) TABS tablet, Take 1 tablet by mouth daily.   ondansetron  (ZOFRAN  ODT) 4 MG disintegrating tablet, Take 1 tablet (4 mg total) by mouth every 8 (eight) hours as needed for nausea or vomiting.   oxybutynin (DITROPAN-XL) 5 MG 24 hr  tablet, Take 1 tablet by mouth daily.   tiZANidine  (ZANAFLEX ) 2 MG tablet, Take 1 tablet (2 mg total) by mouth at bedtime.   traZODone  (DESYREL ) 50 MG tablet, Take 0.5-1 tablets (25-50 mg total) by mouth at bedtime as needed for sleep.   zolpidem (AMBIEN) 10 MG tablet, Take 5 mg by mouth at bedtime as needed (insomnia).   Reviewed prior external information including notes and imaging from  primary care provider As well as notes  that were available from care everywhere and other healthcare systems.  Past medical history, social, surgical and family history all reviewed in electronic medical record.  No pertanent information unless stated regarding to the chief complaint.   Review of Systems:  No headache, visual changes, nausea, vomiting, diarrhea, constipation, dizziness, abdominal pain, skin rash, fevers, chills, night sweats, weight loss, swollen lymph nodes, body aches, joint swelling, chest pain, shortness of breath, mood changes. POSITIVE muscle aches  Objective  Blood pressure 132/86, pulse 67, height 5' 3 (1.6 m), weight 164 lb (74.4 kg), SpO2 98%.   General: No apparent distress alert and oriented x3 mood and affect normal, dressed appropriately.  HEENT: Pupils equal, extraocular movements intact  Respiratory: Patient's speak in full sentences and does not appear short of breath  Cardiovascular: No lower extremity edema, non tender, no erythema  Low back does have loss of lordosis severe tenderness to palpation in the thoracolumbar juncture.    Impression and Recommendations:    The above documentation has been reviewed and is accurate and complete Vernon Maish M Symon Norwood, DO

## 2024-06-28 ENCOUNTER — Ambulatory Visit (INDEPENDENT_AMBULATORY_CARE_PROVIDER_SITE_OTHER): Admitting: Family Medicine

## 2024-06-28 ENCOUNTER — Ambulatory Visit

## 2024-06-28 VITALS — BP 132/86 | HR 67 | Ht 63.0 in | Wt 164.0 lb

## 2024-06-28 DIAGNOSIS — M546 Pain in thoracic spine: Secondary | ICD-10-CM

## 2024-06-28 DIAGNOSIS — B49 Unspecified mycosis: Secondary | ICD-10-CM

## 2024-06-28 MED ORDER — METHYLPREDNISOLONE ACETATE 40 MG/ML IJ SUSP
40.0000 mg | Freq: Once | INTRAMUSCULAR | Status: AC
  Administered 2024-06-28: 40 mg via INTRAMUSCULAR

## 2024-06-28 MED ORDER — TIZANIDINE HCL 2 MG PO TABS: 2.0000 mg | ORAL_TABLET | Freq: Every day | ORAL | 0 refills | Status: AC

## 2024-06-28 MED ORDER — KETOROLAC TROMETHAMINE 30 MG/ML IJ SOLN
30.0000 mg | Freq: Once | INTRAMUSCULAR | Status: AC
  Administered 2024-06-28: 30 mg via INTRAMUSCULAR

## 2024-06-28 MED ORDER — TRAMADOL HCL 50 MG PO TABS: 50.0000 mg | ORAL_TABLET | Freq: Three times a day (TID) | ORAL | 0 refills | Status: AC | PRN

## 2024-06-28 NOTE — Assessment & Plan Note (Addendum)
 Has had trigger point injections previously in the similar area.  Attempted osteopathic manipulation.  Toradol  and Depo-Medrol  given today as well.  Discussed icing regimen and home exercises, discussed which activities to do and which ones to avoid.  Increase activity slowly.  With patient having Aspergillus will get x-rays to further evaluate for any abnormal chest infection or any type of degenerative changes in the thoracic spine.  Worsening pain or shortness of breath to seek medical attention immediately.  Advanced imaging would be warranted as well.  Follow-up again in 6 to 8 weeks.  Due to the pain Toradol  and Depo-Medrol  injection given today.

## 2024-06-28 NOTE — Patient Instructions (Addendum)
 Cocktail injection today Xray today If not better by Friday call we may need further imaging See you again in 6 weeks Zanaflex  2mg  at night

## 2024-06-28 NOTE — Assessment & Plan Note (Signed)
 Following up with infectious disease.  Do think laboratory workup could be beneficial if necessary.  Attempted manipulation.  If worsening symptoms that would like to further evaluate some patient's abnormal blood values that she had when she was in the [place].  Follow-up with me again 6 to 8 weeks otherwise.

## 2024-06-28 NOTE — Assessment & Plan Note (Signed)
 Tramadol  given  Discussed HEp  Discussed JOurnavax  Patient is following up with other providers as well.

## 2024-07-19 ENCOUNTER — Ambulatory Visit: Payer: Self-pay

## 2024-07-19 ENCOUNTER — Ambulatory Visit: Admitting: Internal Medicine

## 2024-07-19 NOTE — Telephone Encounter (Signed)
 Patient states she was not aware that this was the telephone line for [facility]. She states she had called in the past few days and thought this was the phone line to get ahold of Dr Arthea Sharps at [facility]. She states she no longer needs assistance as her OBGYN was able to prescribe her Paxlovid and she is leaving the pharmacy with it now. Patient states she no longer needs to contact her Sports Med doctor and no further assistance needed at this time.

## 2024-07-27 ENCOUNTER — Ambulatory Visit: Admitting: Family Medicine

## 2024-07-31 ENCOUNTER — Ambulatory Visit: Admitting: Internal Medicine

## 2024-07-31 ENCOUNTER — Other Ambulatory Visit: Payer: Self-pay

## 2024-07-31 VITALS — BP 171/90 | HR 82 | Temp 98.3°F | Resp 18 | Ht 63.0 in | Wt 166.0 lb

## 2024-07-31 DIAGNOSIS — Z0000 Encounter for general adult medical examination without abnormal findings: Secondary | ICD-10-CM

## 2024-07-31 DIAGNOSIS — K148 Other diseases of tongue: Secondary | ICD-10-CM | POA: Diagnosis not present

## 2024-07-31 NOTE — Progress Notes (Unsigned)
 Patient ID: ILLA ENLOW, female   DOB: [birth year], 68 y.o.   MRN: [id number]  HPI Rhonda [name] is a 68yo immunocompetent female and in early 2025 she was seen in the ED for a tongue lesion. She was seen at outside hospital that did what appears to be superficial culture that found aspergillus. Decision was made not to treat at taht time and continued with supportive care as discussed with the ENT providers when I had seen her in consultation. Since then her tongue lesion has healed. She states that occasionally she feels that it swells for unclear reasons but she also does suffer from trigeminal neuralgia and has occasional left jaw pain.  She is still Concerned about the results of aspergillus from culture of mouth lesion.she has also followed up with ENT this summer who felt that her tongue appears to be healing well, no concern for underlying infection.  Outpatient Encounter Medications as of 07/31/2024  Medication Sig   ALPRAZolam (XANAX) 0.5 MG tablet Take 0.5 mg by mouth every 6 (six) hours as needed.   aspirin EC 81 MG tablet Take 81 mg by mouth daily.   citalopram  (CELEXA ) 40 MG tablet Take 40 mg by mouth daily.   fish oil-omega-3 fatty acids 1000 MG capsule Take 2 g by mouth daily.   gabapentin (NEURONTIN) 600 MG tablet Take 300 mg by mouth 3 (three) times daily.    Glucosamine 500 MG CAPS Take 1 capsule by mouth daily.   hydrochlorothiazide  (HYDRODIURIL ) 25 MG tablet Take 1 tablet (25 mg total) by mouth daily.   ibuprofen  (ADVIL ) 800 MG tablet Take 1 tablet (800 mg total) by mouth every 8 (eight) hours as needed.   meloxicam  (MOBIC ) 15 MG tablet Take 1 tablet (15 mg total) by mouth daily.   metFORMIN (GLUCOPHAGE) 500 MG tablet Take 500 mg by mouth daily with breakfast.   metoprolol  succinate (TOPROL -XL) 100 MG 24 hr tablet Take 100 mg by mouth every evening. Take with or immediately following a meal.   Multiple Vitamin (MULTIVITAMIN WITH MINERALS) TABS tablet Take 1 tablet by mouth  daily.   olmesartan-hydrochlorothiazide  (BENICAR HCT) 40-25 MG tablet Take 1 tablet by mouth daily.   ondansetron  (ZOFRAN  ODT) 4 MG disintegrating tablet Take 1 tablet (4 mg total) by mouth every 8 (eight) hours as needed for nausea or vomiting.   oxybutynin (DITROPAN-XL) 5 MG 24 hr tablet Take 1 tablet by mouth daily.   oxyCODONE (OXY IR/ROXICODONE) 5 MG immediate release tablet Take 5 mg by mouth every 6 (six) hours as needed for moderate pain (pain score 4-6).   PROAIR  HFA 108 (90 Base) MCG/ACT inhaler Inhale 2 puffs into the lungs every 6 (six) hours as needed.   tiZANidine  (ZANAFLEX ) 2 MG tablet Take 1 tablet (2 mg total) by mouth at bedtime.   traZODone  (DESYREL ) 50 MG tablet Take 0.5-1 tablets (25-50 mg total) by mouth at bedtime as needed for sleep.   zolpidem (AMBIEN) 10 MG tablet Take 5 mg by mouth at bedtime as needed (insomnia).   MOUNJARO 5 MG/0.5ML Pen Inject 5 mg into the skin once a week. (Patient not taking: Reported on 07/31/2024)   No facility-administered encounter medications on file as of 07/31/2024.     Patient Active Problem List   Diagnosis Date Noted   Fungal infection 12/27/2023   Arthritis of right acromioclavicular joint 09/09/2023   Greater trochanteric bursitis of right hip 03/02/2022   Whiplash injuries 10/13/2019   Type 2 diabetes mellitus (HCC)  08/09/2019   Degenerative arthritis of knee, bilateral 06/16/2018   Trigeminal neuralgia 06/03/2017   CMC arthritis 07/27/2016   De Quervain's tenosynovitis, bilateral 01/16/2015   Trigger point of thoracic region 11/15/2014   Nonallopathic lesion of thoracic region 11/15/2014   Nonallopathic lesion of cervical region 11/15/2014   Nonallopathic lesion-rib cage 11/15/2014   Posterior interosseous nerve injury 11/22/2013   HTN (hypertension) 09/28/2013   Chest tightness 09/28/2013   Blood glucose elevated 09/28/2013   Lateral epicondylitis of right elbow 08/11/2013   Anxiety 11/12/2011   Depression 11/12/2011      Health Maintenance Due  Topic Date Due   FOOT EXAM  Never done   OPHTHALMOLOGY EXAM  Never done   Diabetic kidney evaluation - Urine ACR  Never done   Hepatitis C Screening  Never done   DTaP/Tdap/Td (1 - Tdap) Never done   Zoster Vaccines- Shingrix (1 of 2) Never done   HEMOGLOBIN A1C  06/05/2014   Colonoscopy  08/02/2023   Medicare Annual Wellness (AWV)  03/22/2024   Influenza Vaccine  06/09/2024   COVID-19 Vaccine (6 - 2025-26 season) 07/10/2024     Review of Systems  Physical Exam   BP (!) 171/90 (BP Location: Right Arm, Patient Position: Sitting)   Pulse 82   Temp 98.3 F (36.8 C) (Temporal)   Resp 18   Ht 5' 3 (1.6 m)   Wt 166 lb (75.3 kg)   SpO2 98%   BMI 29.41 kg/m    No results found for: CD4TCELL No results found for: CD4TABS No results found for: HIV1RNAQUANT No results found for: HEPBSAB No results found for: RPR, LABRPR  CBC Lab Results  Component Value Date   WBC 16.2 (H) 12/27/2023   RBC 4.03 12/27/2023   HGB 12.6 12/27/2023   HCT 38.0 12/27/2023   PLT 350 12/27/2023   MCV 94.3 12/27/2023   MCH 31.3 12/27/2023   MCHC 33.2 12/27/2023   RDW 13.2 12/27/2023   LYMPHSABS 0.8 12/27/2023   MONOABS 0.6 12/27/2023   EOSABS 0.0 12/27/2023    BMET Lab Results  Component Value Date   NA 135 12/27/2023   K 4.4 12/27/2023   CL 106 12/27/2023   CO2 21 (L) 12/27/2023   GLUCOSE 195 (H) 12/27/2023   BUN 28 (H) 12/27/2023   CREATININE 0.86 12/27/2023   CALCIUM 9.0 12/27/2023   GFRNONAA >60 12/27/2023   GFRAA 86 06/03/2017      Assessment and Plan Ulcerative tongue lesion, now healed =  Tongue looks well healed intact. Reiterated to the patient that since her tongue has healed unlikely to have invasive fungal infection smoldering for the past 6 months. We can test for Aspergillus ab if patient has had exposure. No history to suggest invasive lung disease  Health maintenance= Hx of covid illness, finished isolation  recently-now immune. Recommend that she can get flu vaccine

## 2024-08-04 LAB — ASPERGILLUS ANTIGEN,SERUM
Aspergillus Ag, EIA: NOT DETECTED
Index Value: 0.21 (ref <0.50)

## 2024-08-08 NOTE — Progress Notes (Unsigned)
 [name] [address] [place] [address] Phone: [phone] Subjective:   Rhonda [name], am serving as a scribe for Dr. Arthea [name].  I'm seeing this patient by the request  of:  Pahwani, Rinka R, MD  CC: Thoracic back pain  YEP:Rhonda [name]  06/28/2024 Following up with infectious disease.  Do think laboratory workup could be beneficial if necessary.  Attempted manipulation.  If worsening symptoms that would like to further evaluate some patient's abnormal blood values that she had when she was in the [name].  Follow-up with me again 6 to 8 weeks otherwise.     Has had trigger point injections previously in the similar area.  Attempted osteopathic manipulation.  Toradol  and Depo-Medrol  given today as well.  Discussed icing regimen and home exercises, discussed which activities to do and which ones to avoid.  Increase activity slowly.  With patient having Aspergillus will get x-rays to further evaluate for any abnormal chest infection or any type of degenerative changes in the thoracic spine.  Worsening pain or shortness of breath to seek medical attention immediately.  Advanced imaging would be warranted as well.  Follow-up again in 6 to 8 weeks.  Due to the pain Toradol  and Depo-Medrol  injection given today.      Update 08/09/2024 Rhonda [name] is a 68 y.o. female coming in with complaint of thoracic spine pain. Patient states that she is ok if she doesn't work or picks up grandchildren. Pain not as bad as it once was in the T spine.   Both knees are painful. Would like visco today.        Past Medical History:  Diagnosis Date   Anxiety    Depression    Encephalitis    Hepatitis    ? B when age 1   HTN (hypertension) 09/28/2013   Hypertension    Migraines    Trigeminal neuralgia    Past Surgical History:  Procedure Laterality Date   BRAIN SURGERY  2013   trigeminal neuralgia   BREAST SURGERY     age 89-benign,   COLONOSCOPY WITH  PROPOFOL  N/A 08/01/2013   Procedure: COLONOSCOPY WITH PROPOFOL ;  Surgeon: Rhonda MARLA Louder, MD;  Location: [facility];  Service: Endoscopy;  Laterality: N/A;   CRANIOTOMY     gamma knife     x1   Social History   Socioeconomic History   Marital status: Married    Spouse name: Not on file   Number of children: Not on file   Years of education: Not on file   Highest education level: Not on file  Occupational History   Occupation: Nurse  Tobacco Use   Smoking status: Never   Smokeless tobacco: Never  Vaping Use   Vaping status: Never Used  Substance and Sexual Activity   Alcohol use: Yes    Comment: occassionally wine   Drug use: No   Sexual activity: Not on file  Other Topics Concern   Not on file  Social History Narrative   2025 Lives with husband, Rhonda [name]   Caffeine use: 1 cup coffee per day   Right handed    Social Drivers of Health   Financial Resource Strain: Low Risk  (12/19/2023)   Received from [facility] [place]   [facility] SDOH Financial Resource Strain    How hard is it for you to pay for the very basics like food, housing, medical care, and air conditioning: Not hard at all  Food Insecurity:  Low Risk  (12/19/2023)   Received from [facility] [place]   [facility] SDOH Food Insecurity    Within the past 12 months, the food you bought just didn't last and you didn't have money to get more: Never true  Transportation Needs: Low Risk  (12/19/2023)   Received from [facility] [place]   [facility] Social Determinants: Transport Needs    Has the lack of transportation kept you from medical appointments or from getting medications: No  Physical Activity: Not on file  Stress: Not on file  Social Connections: Not on file   Allergies  Allergen Reactions   Amlodipine Swelling   Lisinopril Cough   Family History  Problem Relation Age of Onset   Arthritis Mother    Hyperlipidemia Mother    Heart disease Mother    Stroke Mother    Hypertension Mother    Diabetes  Mother    Arthritis Father    Hyperlipidemia Father    Heart disease Father    Stroke Father    Hypertension Father    Diabetes Father    Cancer Maternal Grandmother    Kidney disease Maternal Grandmother    Mental illness Maternal Grandmother    Hypertension Maternal Grandmother    Heart disease Maternal Grandmother    Cancer Maternal Grandfather    Kidney disease Maternal Grandfather    Mental illness Maternal Grandfather    Hypertension Maternal Grandfather    Heart disease Maternal Grandfather    Cancer Paternal Grandmother    Kidney disease Paternal Grandmother    Mental illness Paternal Grandmother    Hypertension Paternal Grandmother    Heart disease Paternal Grandmother    Cancer Paternal Grandfather    Kidney disease Paternal Grandfather    Mental illness Paternal Grandfather    Hypertension Paternal Grandfather    Heart disease Paternal Grandfather     Current Outpatient Medications (Endocrine & Metabolic):    metFORMIN (GLUCOPHAGE) 500 MG tablet, Take 500 mg by mouth daily with breakfast.   MOUNJARO 5 MG/0.5ML Pen, Inject 5 mg into the skin once a week.  Current Outpatient Medications (Cardiovascular):    hydrochlorothiazide  (HYDRODIURIL ) 25 MG tablet, Take 1 tablet (25 mg total) by mouth daily.   metoprolol  succinate (TOPROL -XL) 100 MG 24 hr tablet, Take 100 mg by mouth every evening. Take with or immediately following a meal.   olmesartan-hydrochlorothiazide  (BENICAR HCT) 40-25 MG tablet, Take 1 tablet by mouth daily.  Current Outpatient Medications (Respiratory):    PROAIR  HFA 108 (90 Base) MCG/ACT inhaler, Inhale 2 puffs into the lungs every 6 (six) hours as needed.  Current Outpatient Medications (Analgesics):    aspirin EC 81 MG tablet, Take 81 mg by mouth daily.   ibuprofen  (ADVIL ) 800 MG tablet, Take 1 tablet (800 mg total) by mouth every 8 (eight) hours as needed.   meloxicam  (MOBIC ) 15 MG tablet, Take 1 tablet (15 mg total) by mouth daily.   oxyCODONE  (OXY IR/ROXICODONE) 5 MG immediate release tablet, Take 5 mg by mouth every 6 (six) hours as needed for moderate pain (pain score 4-6).   Current Outpatient Medications (Other):    ALPRAZolam (XANAX) 0.5 MG tablet, Take 0.5 mg by mouth every 6 (six) hours as needed.   citalopram  (CELEXA ) 40 MG tablet, Take 40 mg by mouth daily.   fish oil-omega-3 fatty acids 1000 MG capsule, Take 2 g by mouth daily.   gabapentin (NEURONTIN) 600 MG tablet, Take 300 mg by mouth 3 (three) times daily.  Glucosamine 500 MG CAPS, Take 1 capsule by mouth daily.   Multiple Vitamin (MULTIVITAMIN WITH MINERALS) TABS tablet, Take 1 tablet by mouth daily.   ondansetron  (ZOFRAN  ODT) 4 MG disintegrating tablet, Take 1 tablet (4 mg total) by mouth every 8 (eight) hours as needed for nausea or vomiting.   oxybutynin (DITROPAN-XL) 5 MG 24 hr tablet, Take 1 tablet by mouth daily.   tiZANidine  (ZANAFLEX ) 2 MG tablet, Take 1 tablet (2 mg total) by mouth at bedtime.   traZODone  (DESYREL ) 50 MG tablet, Take 0.5-1 tablets (25-50 mg total) by mouth at bedtime as needed for sleep.   zolpidem (AMBIEN) 10 MG tablet, Take 5 mg by mouth at bedtime as needed (insomnia).   Reviewed prior external information including notes and imaging from  primary care provider As well as notes that were available from care everywhere and other healthcare systems.  Past medical history, social, surgical and family history all reviewed in electronic medical record.  No pertanent information unless stated regarding to the chief complaint.   Review of Systems:  No headache, visual changes, nausea, vomiting, diarrhea, constipation, dizziness, abdominal pain, skin rash, fevers, chills, night sweats, weight loss, swollen lymph nodes, body aches, joint swelling, chest pain, shortness of breath, mood changes. POSITIVE muscle aches  Objective  Blood pressure (!) 142/82, pulse 73, height 5' 3 (1.6 m), SpO2 99%.   General: No apparent distress alert and  oriented x3 mood and affect normal, dressed appropriately.  HEENT: Pupils equal, extraocular movements intact  Respiratory: Patient's speak in full sentences and does not appear short of breath  Cardiovascular: No lower extremity edema, non tender, no erythema  Thoracic exam shows patient does have some tenderness to palpation noted. Bilateral knees do have significant arthritic changes noted with trace effusion noted.  Seems to be worse right greater than left.  Does have some limited range of motion noted.  After informed written and verbal consent, patient was seated on exam table. Right knee was prepped with alcohol swab and utilizing anterolateral approach, patient's right knee space was injected with 60 mg per 3 mL of Durolane (sodium hyaluronate) in a prefilled syringe was injected easily into the knee through a 22-gauge needle..Patient tolerated the procedure well without immediate complications.  After informed written and verbal consent, patient was seated on exam table. Left knee was prepped with alcohol swab and utilizing anterolateral approach, patient's left knee space was injected with 60 mg per 3 mL of Durolane (sodium hyaluronate) in a prefilled syringe was injected easily into the knee through a 22-gauge needle..Patient tolerated the procedure well without immediate complications.   Impression and Recommendations:    The above documentation has been reviewed and is accurate and complete Yoon Barca M Antwine Agosto, DO

## 2024-08-09 ENCOUNTER — Encounter: Payer: Self-pay | Admitting: Family Medicine

## 2024-08-09 ENCOUNTER — Ambulatory Visit: Admitting: Family Medicine

## 2024-08-09 VITALS — BP 142/82 | HR 73 | Ht 63.0 in

## 2024-08-09 DIAGNOSIS — M170 Bilateral primary osteoarthritis of knee: Secondary | ICD-10-CM

## 2024-08-09 MED ORDER — SODIUM HYALURONATE 60 MG/3ML IX PRSY
120.0000 mg | PREFILLED_SYRINGE | Freq: Once | INTRA_ARTICULAR | Status: AC
Start: 2024-08-09 — End: 2024-08-09
  Administered 2024-08-09: 120 mg via INTRA_ARTICULAR

## 2024-08-09 NOTE — Patient Instructions (Addendum)
 Durolane injections today for B knees Geni Shutter, DO See me again in 3 months

## 2024-08-09 NOTE — Assessment & Plan Note (Signed)
 Repeat viscosupplementation given again today.  In August 09, 2024.  Has responded to it in the past and hopefully will be making improvement again.  Discussed continuing to be strengthening, discussed which activities to do and which ones to avoid.  Increase activity slowly.  Discussed icing regimen.  Follow-up again in 6 to 8 weeks otherwise.

## 2024-08-10 ENCOUNTER — Telehealth: Payer: Self-pay

## 2024-08-10 NOTE — Telephone Encounter (Signed)
 Durolane is authorized for bilateral knee Coinsurance 80% Copay $35.00 Deductible does not apply OOP MAX $5500 has met $2641.08 Once OOP has been met coverage goes to 100% and copay will no longer apply  NO PRE CERT REQUIRED  Reference # [id number]

## 2024-09-25 NOTE — Progress Notes (Unsigned)
 [name] [address] [place] [address] Phone: [phone] Subjective:   LILLETTE Berwyn Posey, am serving as a scribe for Dr. Arthea Claudene.  I'm seeing this patient by the request  of:  Pahwani, Rinka R, MD  CC: Right hip pain  YEP:Dlagzrupcz  08/09/2024 Repeat viscosupplementation given again today.  In August 09, 2024.  Has responded to it in the past and hopefully will be making improvement again.  Discussed continuing to be strengthening, discussed which activities to do and which ones to avoid.  Increase activity slowly.  Discussed icing regimen.  Follow-up again in 6 to 8 weeks otherwise.     Updated 09/26/2024 SWAY GUTTIERREZ is a 68 y.o. female coming in with complaint of B knee pain. Patient states that her knees are doing well.   Pain for past 2 months in R glute pain. Pain radiates into lateral aspect. Pain worse in mornings and with stairs. Has days when she does not have pain. Also has pain near bra line that can get tight.   Taking journavax and this seems to be helping.      Past Medical History:  Diagnosis Date   Anxiety    Depression    Encephalitis    Hepatitis    ? B when age 33   HTN (hypertension) 09/28/2013   Hypertension    Migraines    Trigeminal neuralgia    Past Surgical History:  Procedure Laterality Date   BRAIN SURGERY  2013   trigeminal neuralgia   BREAST SURGERY     age 56-benign,   COLONOSCOPY WITH PROPOFOL  N/A 08/01/2013   Procedure: COLONOSCOPY WITH PROPOFOL ;  Surgeon: Gladis MARLA Louder, MD;  Location: [facility];  Service: Endoscopy;  Laterality: N/A;   CRANIOTOMY     gamma knife     x1   Social History   Socioeconomic History   Marital status: Married    Spouse name: Not on file   Number of children: Not on file   Years of education: Not on file   Highest education level: Not on file  Occupational History   Occupation: Nurse  Tobacco Use   Smoking status: Never   Smokeless tobacco: Never   Vaping Use   Vaping status: Never Used  Substance and Sexual Activity   Alcohol use: Yes    Comment: occassionally wine   Drug use: No   Sexual activity: Not on file  Other Topics Concern   Not on file  Social History Narrative   2025 Lives with husband, [name]   Caffeine use: 1 cup coffee per day   Right handed    Social Drivers of Health   Financial Resource Strain: Not on file (12/19/2023)  Food Insecurity: Not on file (12/19/2023)  Transportation Needs: Low Risk (12/19/2023)   Received from [facility] [place]   [facility] Social Determinants: Transport Needs    Has the lack of transportation kept you from medical appointments or from getting medications: No  Physical Activity: Not on file  Stress: Not on file  Social Connections: Not on file   Allergies  Allergen Reactions   Amlodipine Swelling   Lisinopril Cough   Family History  Problem Relation Age of Onset   Arthritis Mother    Hyperlipidemia Mother    Heart disease Mother    Stroke Mother    Hypertension Mother    Diabetes Mother    Arthritis Father    Hyperlipidemia Father    Heart  disease Father    Stroke Father    Hypertension Father    Diabetes Father    Cancer Maternal Grandmother    Kidney disease Maternal Grandmother    Mental illness Maternal Grandmother    Hypertension Maternal Grandmother    Heart disease Maternal Grandmother    Cancer Maternal Grandfather    Kidney disease Maternal Grandfather    Mental illness Maternal Grandfather    Hypertension Maternal Grandfather    Heart disease Maternal Grandfather    Cancer Paternal Grandmother    Kidney disease Paternal Grandmother    Mental illness Paternal Grandmother    Hypertension Paternal Grandmother    Heart disease Paternal Grandmother    Cancer Paternal Grandfather    Kidney disease Paternal Grandfather    Mental illness Paternal Grandfather    Hypertension Paternal Grandfather    Heart disease Paternal Grandfather     Current  Outpatient Medications (Endocrine & Metabolic):    metFORMIN (GLUCOPHAGE) 500 MG tablet, Take 500 mg by mouth daily with breakfast.   MOUNJARO 5 MG/0.5ML Pen, Inject 5 mg into the skin once a week.  Current Outpatient Medications (Cardiovascular):    hydrochlorothiazide  (HYDRODIURIL ) 25 MG tablet, Take 1 tablet (25 mg total) by mouth daily.   metoprolol  succinate (TOPROL -XL) 100 MG 24 hr tablet, Take 100 mg by mouth every evening. Take with or immediately following a meal.   olmesartan-hydrochlorothiazide  (BENICAR HCT) 40-25 MG tablet, Take 1 tablet by mouth daily.  Current Outpatient Medications (Respiratory):    PROAIR  HFA 108 (90 Base) MCG/ACT inhaler, Inhale 2 puffs into the lungs every 6 (six) hours as needed.  Current Outpatient Medications (Analgesics):    aspirin EC 81 MG tablet, Take 81 mg by mouth daily.   ibuprofen  (ADVIL ) 800 MG tablet, Take 1 tablet (800 mg total) by mouth every 8 (eight) hours as needed.   meloxicam  (MOBIC ) 15 MG tablet, Take 1 tablet (15 mg total) by mouth daily.   oxyCODONE (OXY IR/ROXICODONE) 5 MG immediate release tablet, Take 5 mg by mouth every 6 (six) hours as needed for moderate pain (pain score 4-6).   Current Outpatient Medications (Other):    ALPRAZolam (XANAX) 0.5 MG tablet, Take 0.5 mg by mouth every 6 (six) hours as needed.   citalopram  (CELEXA ) 40 MG tablet, Take 40 mg by mouth daily.   fish oil-omega-3 fatty acids 1000 MG capsule, Take 2 g by mouth daily.   gabapentin (NEURONTIN) 600 MG tablet, Take 300 mg by mouth 3 (three) times daily.    Glucosamine 500 MG CAPS, Take 1 capsule by mouth daily.   Multiple Vitamin (MULTIVITAMIN WITH MINERALS) TABS tablet, Take 1 tablet by mouth daily.   ondansetron  (ZOFRAN  ODT) 4 MG disintegrating tablet, Take 1 tablet (4 mg total) by mouth every 8 (eight) hours as needed for nausea or vomiting.   oxybutynin (DITROPAN-XL) 5 MG 24 hr tablet, Take 1 tablet by mouth daily.   tiZANidine  (ZANAFLEX ) 2 MG tablet,  Take 1 tablet (2 mg total) by mouth at bedtime.   traZODone  (DESYREL ) 50 MG tablet, Take 0.5-1 tablets (25-50 mg total) by mouth at bedtime as needed for sleep.   zolpidem (AMBIEN) 10 MG tablet, Take 5 mg by mouth at bedtime as needed (insomnia).   Reviewed prior external information including notes and imaging from  primary care provider As well as notes that were available from care everywhere and other healthcare systems.  Past medical history, social, surgical and family history all reviewed in electronic medical record.  No pertanent information unless stated regarding to the chief complaint.   Review of Systems:  No headache, visual changes, nausea, vomiting, diarrhea, constipation, dizziness, abdominal pain, skin rash, fevers, chills, night sweats, weight loss, swollen lymph nodes, body aches, joint swelling, chest pain, shortness of breath, mood changes. POSITIVE muscle aches  Objective  There were no vitals taken for this visit.   General: No apparent distress alert and oriented x3 mood and affect normal, dressed appropriately.  HEENT: Pupils equal, extraocular movements intact  Respiratory: Patient's speak in full sentences and does not appear short of breath  Cardiovascular: No lower extremity edema, non tender, no erythema  Right hip exam shows    Impression and Recommendations:     The above documentation has been reviewed and is accurate and complete Aretha Levi M Keeyon Privitera, DO

## 2024-09-26 ENCOUNTER — Ambulatory Visit (INDEPENDENT_AMBULATORY_CARE_PROVIDER_SITE_OTHER): Admitting: Family Medicine

## 2024-09-26 ENCOUNTER — Ambulatory Visit

## 2024-09-26 VITALS — BP 124/88 | HR 65 | Ht 63.0 in

## 2024-09-26 DIAGNOSIS — G500 Trigeminal neuralgia: Secondary | ICD-10-CM

## 2024-09-26 DIAGNOSIS — M7061 Trochanteric bursitis, right hip: Secondary | ICD-10-CM

## 2024-09-26 DIAGNOSIS — M546 Pain in thoracic spine: Secondary | ICD-10-CM | POA: Diagnosis not present

## 2024-09-26 DIAGNOSIS — M545 Low back pain, unspecified: Secondary | ICD-10-CM

## 2024-09-26 MED ORDER — PREDNISONE 20 MG PO TABS
40.0000 mg | ORAL_TABLET | Freq: Every day | ORAL | 0 refills | Status: AC
Start: 2024-09-26 — End: ?

## 2024-09-26 NOTE — Patient Instructions (Addendum)
 Injection in hip  Good to see you! Xray today Prednisone  40mg  prescribed See you again in 2 months but send a message Friday if not much better

## 2024-09-26 NOTE — Assessment & Plan Note (Signed)
 Patient is taking a peripheral calcium channel blocker that I think is helping significantly.  Hopefully this will make a big difference.  Follow-up again in 6 to 8 weeks

## 2024-09-26 NOTE — Assessment & Plan Note (Addendum)
 Thoracolumbar x-rays ordered today.  Discussed with patient about icing regimen and home exercises, discussed which activities to do which ones to avoid.  Increase activity slowly.  Discussed icing regimen.  Discussed if any worsening symptoms that advanced imaging is warranted.  Follow-up again in 6 to 12 weeks

## 2024-09-26 NOTE — Assessment & Plan Note (Signed)
 Patient given injection and tolerated the procedure well, discussed icing regimen and home exercises, increase activity slowly.  Discussed icing regimen.  Follow-up in 6 to 12 weeks

## 2024-09-27 ENCOUNTER — Encounter: Payer: Self-pay | Admitting: Family Medicine

## 2024-10-01 ENCOUNTER — Ambulatory Visit: Payer: Self-pay | Admitting: Family Medicine

## 2024-11-23 NOTE — Progress Notes (Signed)
 " [name] [address] [place] [address] Phone: [phone] Subjective:   LILLETTE Claretha Schimke am a scribe for Dr. Claudene.   I'm seeing this patient by the request  of:  Pahwani, Rinka R, MD  CC: Low back pain  YEP:Dlagzrupcz  09/26/2024 Thoracolumbar x-rays ordered today.  Discussed with patient about icing regimen and home exercises, discussed which activities to do which ones to avoid.  Increase activity slowly.  Discussed icing regimen.  Discussed if any worsening symptoms that advanced imaging is warranted.  Follow-up again in 6 to 12 weeks     Patient is taking a peripheral calcium channel blocker that I think is helping significantly.  Hopefully this will make a big difference.  Follow-up again in 6 to 8 weeks     Patient given injection and tolerated the procedure well, discussed icing regimen and home exercises, increase activity slowly.  Discussed icing regimen.  Follow-up in 6 to 12 weeks      Update 11/29/2024 Rhonda [name] is a 69 y.o. female coming in with complaint of LBP. Patient states that it is a ghost of what it was. It isn't sharp or painful today. Wouldn't even rate it a 1 out of 10.       Past Medical History:  Diagnosis Date   Anxiety    Depression    Encephalitis    Hepatitis    ? B when age 51   HTN (hypertension) 09/28/2013   Hypertension    Migraines    Trigeminal neuralgia    Past Surgical History:  Procedure Laterality Date   BRAIN SURGERY  2013   trigeminal neuralgia   BREAST SURGERY     age 28-benign,   COLONOSCOPY WITH PROPOFOL  N/A 08/01/2013   Procedure: COLONOSCOPY WITH PROPOFOL ;  Surgeon: Gladis MARLA Louder, MD;  Location: [facility];  Service: Endoscopy;  Laterality: N/A;   CRANIOTOMY     gamma knife     x1   Social History   Socioeconomic History   Marital status: Married    Spouse name: Not on file   Number of children: Not on file   Years of education: Not on file   Highest education  level: Not on file  Occupational History   Occupation: Nurse  Tobacco Use   Smoking status: Never   Smokeless tobacco: Never  Vaping Use   Vaping status: Never Used  Substance and Sexual Activity   Alcohol use: Yes    Comment: occassionally wine   Drug use: No   Sexual activity: Not on file  Other Topics Concern   Not on file  Social History Narrative   2025 Lives with husband, [name]   Caffeine use: 1 cup coffee per day   Right handed    Social Drivers of Health   Tobacco Use: Low Risk (09/27/2024)   Patient History    Smoking Tobacco Use: Never    Smokeless Tobacco Use: Never    Passive Exposure: Not on file  Financial Resource Strain: Not on file (12/19/2023)  Food Insecurity: Not on file (12/19/2023)  Transportation Needs: Low Risk (12/19/2023)   Received from [facility]   [facility] Social Determinants: Transport Needs    Has the lack of transportation kept you from medical appointments or from getting medications: No  Physical Activity: Not on file  Stress: Not on file  Social Connections: Not on file  Depression (PHQ2-9): Low Risk (07/31/2024)   Depression (PHQ2-9)  PHQ-2 Score: 0  Alcohol Screen: Not on file  Housing: Not on file  Utilities: Not on file (12/19/2023)  Health Literacy: Not on file   Allergies[1] Family History  Problem Relation Age of Onset   Arthritis Mother    Hyperlipidemia Mother    Heart disease Mother    Stroke Mother    Hypertension Mother    Diabetes Mother    Arthritis Father    Hyperlipidemia Father    Heart disease Father    Stroke Father    Hypertension Father    Diabetes Father    Cancer Maternal Grandmother    Kidney disease Maternal Grandmother    Mental illness Maternal Grandmother    Hypertension Maternal Grandmother    Heart disease Maternal Grandmother    Cancer Maternal Grandfather    Kidney disease Maternal Grandfather    Mental illness Maternal Grandfather    Hypertension Maternal Grandfather    Heart  disease Maternal Grandfather    Cancer Paternal Grandmother    Kidney disease Paternal Grandmother    Mental illness Paternal Grandmother    Hypertension Paternal Grandmother    Heart disease Paternal Grandmother    Cancer Paternal Grandfather    Kidney disease Paternal Grandfather    Mental illness Paternal Grandfather    Hypertension Paternal Grandfather    Heart disease Paternal Grandfather     Current Outpatient Medications (Endocrine & Metabolic):    metFORMIN (GLUCOPHAGE) 500 MG tablet, Take 500 mg by mouth daily with breakfast.   MOUNJARO 5 MG/0.5ML Pen, Inject 5 mg into the skin once a week.   predniSONE  (DELTASONE ) 20 MG tablet, Take 2 tablets (40 mg total) by mouth daily with breakfast.   tirzepatide (MOUNJARO) 2.5 MG/0.5ML Pen, Inject 2.5 mg into the skin once a week.  Current Outpatient Medications (Cardiovascular):    hydrochlorothiazide  (HYDRODIURIL ) 25 MG tablet, Take 1 tablet (25 mg total) by mouth daily.   metoprolol  succinate (TOPROL -XL) 100 MG 24 hr tablet, Take 100 mg by mouth every evening. Take with or immediately following a meal.   olmesartan-hydrochlorothiazide  (BENICAR HCT) 40-25 MG tablet, Take 1 tablet by mouth daily.  Current Outpatient Medications (Respiratory):    PROAIR  HFA 108 (90 Base) MCG/ACT inhaler, Inhale 2 puffs into the lungs every 6 (six) hours as needed.  Current Outpatient Medications (Analgesics):    aspirin EC 81 MG tablet, Take 81 mg by mouth daily.   ibuprofen  (ADVIL ) 800 MG tablet, Take 1 tablet (800 mg total) by mouth every 8 (eight) hours as needed.   meloxicam  (MOBIC ) 15 MG tablet, Take 1 tablet (15 mg total) by mouth daily.   oxyCODONE (OXY IR/ROXICODONE) 5 MG immediate release tablet, Take 5 mg by mouth every 6 (six) hours as needed for moderate pain (pain score 4-6).   Suzetrigine (JOURNAVX) 50 MG TABS, Take 50 mg by mouth in the morning and at bedtime.  Current Outpatient Medications (Other):    ALPRAZolam (XANAX) 0.5 MG  tablet, Take 0.5 mg by mouth every 6 (six) hours as needed.   citalopram  (CELEXA ) 40 MG tablet, Take 40 mg by mouth daily.   fish oil-omega-3 fatty acids 1000 MG capsule, Take 2 g by mouth daily.   gabapentin (NEURONTIN) 600 MG tablet, Take 300 mg by mouth 3 (three) times daily.    Glucosamine 500 MG CAPS, Take 1 capsule by mouth daily.   Multiple Vitamin (MULTIVITAMIN WITH MINERALS) TABS tablet, Take 1 tablet by mouth daily.   ondansetron  (ZOFRAN  ODT) 4 MG disintegrating tablet,  Take 1 tablet (4 mg total) by mouth every 8 (eight) hours as needed for nausea or vomiting.   oxybutynin (DITROPAN-XL) 5 MG 24 hr tablet, Take 1 tablet by mouth daily.   tiZANidine  (ZANAFLEX ) 2 MG tablet, Take 1 tablet (2 mg total) by mouth at bedtime.   traZODone  (DESYREL ) 50 MG tablet, Take 0.5-1 tablets (25-50 mg total) by mouth at bedtime as needed for sleep.   zolpidem (AMBIEN) 10 MG tablet, Take 5 mg by mouth at bedtime as needed (insomnia).   Reviewed prior external information including notes and imaging from  primary care provider As well as notes that were available from care everywhere and other healthcare systems.  Past medical history, social, surgical and family history all reviewed in electronic medical record.  No pertanent information unless stated regarding to the chief complaint.   Review of Systems:  No headache, visual changes, nausea, vomiting, diarrhea, constipation, dizziness, abdominal pain, skin rash, fevers, chills, night sweats, weight loss, swollen lymph nodes, body aches, joint swelling,, dysuria or hematuria chest pain, shortness of breath, mood changes. POSITIVE muscle aches  Objective  Blood pressure 138/80, pulse 76, height 5' 3 (1.6 m), SpO2 99%.   General: No apparent distress alert and oriented x3 mood and affect normal, dressed appropriately.  HEENT: Pupils equal, extraocular movements intact  Respiratory: Patient's speak in full sentences and does not appear short of breath   Cardiovascular: No lower extremity edema, non tender, no erythema  , Some mild increase in kyphosis noted.  Tenderness to palpation of the thoracolumbar juncture on the right side.  Mild CVA tenderness noted.   Osteopathic findings  T3 extended rotated and side bent right inhaled third rib T9 extended rotated and side bent right with inhaled rib L1 flexed rotated and side bent right Sacrum right on right    Impression and Recommendations:    Thoracolumbar back pain Continue with discomfort in the area.  Could consider the possible for the trigger points.  Did not make significant improvement though she states previously.  We discussed with patient about the possibility of further workup for viscerosomatic including kidney function which patient has had difficulty when she gets sick previously.  Patient does not want to do that at the moment.    Decision today to treat with OMT was based on Physical Exam  After verbal consent patient was treated with HVLA, ME, FPR techniques in , thoracic, rib, lumbar and sacral areas, all areas are chronic   Patient tolerated the procedure well with improvement in symptoms  Patient given exercises, stretches and lifestyle modifications  See medications in patient instructions if given  Patient will follow up in 4-8 weeks  The above documentation has been reviewed and is accurate and complete Arthea CHRISTELLA Sharps, DO      [1]  Allergies Allergen Reactions   Amlodipine Swelling   Lisinopril Cough   "

## 2024-11-29 ENCOUNTER — Encounter: Payer: Self-pay | Admitting: Family Medicine

## 2024-11-29 ENCOUNTER — Ambulatory Visit: Admitting: Family Medicine

## 2024-11-29 VITALS — BP 138/80 | HR 76 | Ht 63.0 in

## 2024-11-29 DIAGNOSIS — M9902 Segmental and somatic dysfunction of thoracic region: Secondary | ICD-10-CM | POA: Diagnosis not present

## 2024-11-29 DIAGNOSIS — M9904 Segmental and somatic dysfunction of sacral region: Secondary | ICD-10-CM | POA: Diagnosis not present

## 2024-11-29 DIAGNOSIS — M546 Pain in thoracic spine: Secondary | ICD-10-CM | POA: Diagnosis not present

## 2024-11-29 DIAGNOSIS — M9903 Segmental and somatic dysfunction of lumbar region: Secondary | ICD-10-CM

## 2024-11-29 DIAGNOSIS — M545 Low back pain, unspecified: Secondary | ICD-10-CM

## 2024-11-29 NOTE — Assessment & Plan Note (Signed)
 Continue with discomfort in the area.  Could consider the possible for the trigger points.  Did not make significant improvement though she states previously.  We discussed with patient about the possibility of further workup for viscerosomatic including kidney function which patient has had difficulty when she gets sick previously.  Patient does not want to do that at the moment.

## 2024-11-29 NOTE — Patient Instructions (Addendum)
 Good to see you. See me again in 6 to 8 weeks.
